# Patient Record
Sex: Female | Born: 1935 | Race: White | Hispanic: No | Marital: Married | Smoking: Never smoker
Health system: Southern US, Community
[De-identification: ages and names within clinical notes are randomized; demographics above are authoritative.]

## PROBLEM LIST (undated history)

## (undated) DIAGNOSIS — C8510 Unspecified B-cell lymphoma, unspecified site: Secondary | ICD-10-CM

## (undated) DIAGNOSIS — I509 Heart failure, unspecified: Secondary | ICD-10-CM

## (undated) DIAGNOSIS — I495 Sick sinus syndrome: Secondary | ICD-10-CM

## (undated) DIAGNOSIS — E039 Hypothyroidism, unspecified: Secondary | ICD-10-CM

## (undated) DIAGNOSIS — M329 Systemic lupus erythematosus, unspecified: Secondary | ICD-10-CM

## (undated) DIAGNOSIS — K219 Gastro-esophageal reflux disease without esophagitis: Secondary | ICD-10-CM

## (undated) DIAGNOSIS — I6529 Occlusion and stenosis of unspecified carotid artery: Secondary | ICD-10-CM

## (undated) DIAGNOSIS — I10 Essential (primary) hypertension: Secondary | ICD-10-CM

## (undated) DIAGNOSIS — I4891 Unspecified atrial fibrillation: Secondary | ICD-10-CM

## (undated) DIAGNOSIS — IMO0002 Reserved for concepts with insufficient information to code with codable children: Secondary | ICD-10-CM

## (undated) HISTORY — DX: Occlusion and stenosis of unspecified carotid artery: I65.29

## (undated) HISTORY — PX: TOTAL KNEE ARTHROPLASTY: SHX125

## (undated) HISTORY — PX: LAPAROSCOPIC CHOLECYSTECTOMY: SUR755

## (undated) HISTORY — DX: Systemic lupus erythematosus, unspecified: M32.9

## (undated) HISTORY — DX: Gastro-esophageal reflux disease without esophagitis: K21.9

## (undated) HISTORY — DX: Essential (primary) hypertension: I10

## (undated) HISTORY — DX: Sick sinus syndrome: I49.5

## (undated) HISTORY — DX: Unspecified atrial fibrillation: I48.91

## (undated) HISTORY — DX: Reserved for concepts with insufficient information to code with codable children: IMO0002

---

## 2004-05-28 ENCOUNTER — Inpatient Hospital Stay (HOSPITAL_COMMUNITY): Admission: AD | Admit: 2004-05-28 | Discharge: 2004-05-29 | Payer: Self-pay | Admitting: *Deleted

## 2004-05-29 ENCOUNTER — Encounter: Payer: Self-pay | Admitting: Cardiology

## 2004-05-31 ENCOUNTER — Inpatient Hospital Stay (HOSPITAL_COMMUNITY): Admission: RE | Admit: 2004-05-31 | Discharge: 2004-06-04 | Payer: Self-pay | Admitting: Orthopaedic Surgery

## 2004-10-11 ENCOUNTER — Ambulatory Visit: Payer: Self-pay | Admitting: Cardiology

## 2005-09-26 ENCOUNTER — Ambulatory Visit: Payer: Self-pay | Admitting: Cardiology

## 2005-10-01 ENCOUNTER — Ambulatory Visit: Payer: Self-pay | Admitting: Cardiology

## 2005-10-23 ENCOUNTER — Ambulatory Visit: Payer: Self-pay | Admitting: Cardiology

## 2005-10-24 ENCOUNTER — Ambulatory Visit: Payer: Self-pay | Admitting: Cardiology

## 2006-05-26 ENCOUNTER — Ambulatory Visit: Payer: Self-pay | Admitting: Cardiology

## 2007-01-05 ENCOUNTER — Ambulatory Visit: Payer: Self-pay | Admitting: Cardiology

## 2008-01-11 ENCOUNTER — Ambulatory Visit: Payer: Self-pay | Admitting: Cardiology

## 2008-01-12 ENCOUNTER — Encounter: Payer: Self-pay | Admitting: Cardiology

## 2008-07-30 ENCOUNTER — Encounter: Payer: Self-pay | Admitting: Cardiology

## 2009-09-05 ENCOUNTER — Encounter: Payer: Self-pay | Admitting: Cardiology

## 2010-06-25 ENCOUNTER — Encounter: Payer: Self-pay | Admitting: Cardiology

## 2010-06-25 ENCOUNTER — Encounter: Payer: Self-pay | Admitting: Physician Assistant

## 2010-06-26 ENCOUNTER — Ambulatory Visit: Payer: Self-pay | Admitting: Cardiology

## 2010-06-26 ENCOUNTER — Encounter: Payer: Self-pay | Admitting: Physician Assistant

## 2010-06-27 ENCOUNTER — Encounter: Payer: Self-pay | Admitting: Physician Assistant

## 2010-06-27 ENCOUNTER — Ambulatory Visit: Payer: Self-pay | Admitting: Internal Medicine

## 2010-06-27 ENCOUNTER — Inpatient Hospital Stay (HOSPITAL_COMMUNITY): Admission: AD | Admit: 2010-06-27 | Discharge: 2010-06-30 | Payer: Self-pay | Admitting: Internal Medicine

## 2010-06-28 ENCOUNTER — Encounter: Payer: Self-pay | Admitting: Cardiology

## 2010-06-29 ENCOUNTER — Encounter: Payer: Self-pay | Admitting: Cardiology

## 2010-06-29 HISTORY — PX: PACEMAKER INSERTION: SHX728

## 2010-06-30 ENCOUNTER — Encounter: Payer: Self-pay | Admitting: Cardiology

## 2010-07-03 ENCOUNTER — Encounter: Payer: Self-pay | Admitting: Internal Medicine

## 2010-07-12 ENCOUNTER — Ambulatory Visit: Payer: Self-pay

## 2010-07-12 ENCOUNTER — Encounter: Payer: Self-pay | Admitting: Internal Medicine

## 2010-07-30 ENCOUNTER — Ambulatory Visit: Payer: Self-pay | Admitting: Physician Assistant

## 2010-07-30 DIAGNOSIS — D649 Anemia, unspecified: Secondary | ICD-10-CM

## 2010-07-30 DIAGNOSIS — I4891 Unspecified atrial fibrillation: Secondary | ICD-10-CM

## 2010-07-30 DIAGNOSIS — I6529 Occlusion and stenosis of unspecified carotid artery: Secondary | ICD-10-CM

## 2010-07-30 DIAGNOSIS — Z950 Presence of cardiac pacemaker: Secondary | ICD-10-CM | POA: Insufficient documentation

## 2010-08-03 ENCOUNTER — Ambulatory Visit: Payer: Self-pay | Admitting: Cardiology

## 2010-08-03 ENCOUNTER — Encounter: Payer: Self-pay | Admitting: Cardiology

## 2010-08-07 ENCOUNTER — Ambulatory Visit: Payer: Self-pay | Admitting: Cardiology

## 2010-08-07 LAB — CONVERTED CEMR LAB: POC INR: 2.3

## 2010-08-14 ENCOUNTER — Ambulatory Visit: Payer: Self-pay | Admitting: Cardiology

## 2010-08-14 LAB — CONVERTED CEMR LAB: POC INR: 2.1

## 2010-08-24 ENCOUNTER — Ambulatory Visit: Payer: Self-pay | Admitting: Cardiology

## 2010-08-24 LAB — CONVERTED CEMR LAB: POC INR: 2.4

## 2010-08-27 ENCOUNTER — Ambulatory Visit: Payer: Self-pay | Admitting: Cardiology

## 2010-08-27 DIAGNOSIS — I495 Sick sinus syndrome: Secondary | ICD-10-CM | POA: Insufficient documentation

## 2010-09-11 ENCOUNTER — Ambulatory Visit: Payer: Self-pay | Admitting: Cardiology

## 2010-09-25 ENCOUNTER — Ambulatory Visit: Payer: Self-pay | Admitting: Internal Medicine

## 2010-09-25 DIAGNOSIS — I10 Essential (primary) hypertension: Secondary | ICD-10-CM

## 2010-10-02 ENCOUNTER — Ambulatory Visit: Payer: Self-pay | Admitting: Cardiology

## 2010-10-17 ENCOUNTER — Encounter: Payer: Self-pay | Admitting: Cardiology

## 2010-11-02 ENCOUNTER — Ambulatory Visit: Admission: RE | Admit: 2010-11-02 | Discharge: 2010-11-02 | Payer: Self-pay | Source: Home / Self Care

## 2010-11-14 NOTE — Cardiovascular Report (Signed)
Summary: Office Visit   Office Visit   Imported By: Roderic Ovens 07/23/2010 11:15:00  _____________________________________________________________________  External Attachment:    Type:   Image     Comment:   External Document

## 2010-11-14 NOTE — Medication Information (Signed)
Summary: ccr-lr  Anticoagulant Therapy  Managed by: Vashti Hey, RN PCP: Dr. Kirstie Peri Supervising MD: Andee Lineman MD, Michelle Piper Indication 1: Atrial Fibrillation Lab Used: [facility] [place] Site: [place] INR POC 2.0  Dietary changes: no    Health status changes: no    Bleeding/hemorrhagic complications: no    Recent/future hospitalizations: no    Any changes in medication regimen? no    Recent/future dental: no  Any missed doses?: no       Is patient compliant with meds? yes       Allergies: 1)  ! Penicillin  Anticoagulation Management History:      The patient is taking warfarin and comes in today for a routine follow up visit.  Positive risk factors for bleeding include an age of 13 years or older.  The bleeding index is 'intermediate risk'.  Positive CHADS2 values include Age > 76 years old.  Anticoagulation responsible provider: Andee Lineman MD, Michelle Piper.  INR POC: 2.0.  Cuvette Lot#: [id number].    Anticoagulation Management Assessment/Plan:      The patient's current anticoagulation dose is Warfarin sodium 2.5 mg tabs: Use as directed by Anticoagualtion Clinic.  The target INR is 2.0-3.0.  The next INR is due 10/02/2010.  Anticoagulation instructions were given to patient.  Results were reviewed/authorized by Vashti Hey, RN.  She was notified by Vashti Hey RN.         Prior Anticoagulation Instructions: INR 2.4 Continue coumadin 2.5mg  once daily except 5mg  on T,Th,Sat  Current Anticoagulation Instructions: INR 2.0 Continue coumadin 2.5mg  once daily except 5mg  on T,Th,Sat

## 2010-11-14 NOTE — Medication Information (Signed)
Summary: ZOX:WRUEA 2.5MG  ON 10/17-[facility] PT-JM  Anticoagulant Therapy  Managed by: Vashti Hey, RN PCP: Seabron Spates Supervising MD: Andee Lineman MD, Marzetta Merino Used: [facility] [place] Site: [place] INR POC 1.1           Allergies: 1)  ! Penicillin  Anticoagulation Management History:      The patient comes in today for her initial visit for anticoagulation therapy.  Positive risk factors for bleeding include an age of 75 years or older.  The bleeding index is 'intermediate risk'.  Positive CHADS2 values include Age > 54 years old.  Anticoagulation responsible Viana Sleep: Andee Lineman MD, Michelle Piper.  INR POC: 1.1.    Anticoagulation Management Assessment/Plan:      The patient's current anticoagulation dose is Warfarin sodium 2.5 mg tabs: Use as directed by Anticoagualtion Clinic.  The target INR is 2.0-3.0.  The next INR is due 08/07/2010.  Anticoagulation instructions were given to patient.  Results were reviewed/authorized by Vashti Hey, RN.  She was notified by Vashti Hey RN.        Coagulation management information includes: not pending DCCV.  Current Anticoagulation Instructions: INR 1.1 Increase coumadin to 2 tablets once daily

## 2010-11-14 NOTE — Consult Note (Signed)
Summary: CARDIOLOGY CONSULT/ [facility]  CARDIOLOGY CONSULT/ [facility]   Imported By: Zachary George 07/30/2010 10:01:59  _____________________________________________________________________  External Attachment:    Type:   Image     Comment:   External Document

## 2010-11-14 NOTE — Assessment & Plan Note (Signed)
Summary: 1 MO F/U PER 10/17 OV W/DR. HOCHREIN-JM   Visit Type:  Follow-up Primary Provider:  Dr. Kirstie Peri   History of Present Illness: 77 year old woman presents for followup. She is a patient of Dr. Andee Lineman, although recently saw Dr. Antoine Poche in mid-October for post hospital followup. Coumadin was initiated at that time.  I reviewed her recent history following inpatient consultation at [facility]. Symptomatically she is doing better now status post pacemaker placement, although does tend to have some sense of elevated heart rate with activity. It is somewhat better on Corag.  She reports having trouble with alopecia, possibly related to a previous blood pressure medication, details not clear. She seems to be tolerating her present medicines well.  No reported bleeding problems on Coumadin, follow through our Coumadin clinic.   Preventive Screening-Counseling & Management  Alcohol-Tobacco     Smoking Status: never  Current Medications (verified): 1)  Carvedilol 6.25 Mg Tabs (Carvedilol) .... Take 1 1/2 Tablets By Mouth Two Times A Day. 2)  Vitamin D3 1000 Unit Tabs (Cholecalciferol) .... One By Mouth Daily 3)  Benazepril Hcl 20 Mg Tabs (Benazepril Hcl) .... Take 1 Tablet By Mouth Once A Day 4)  Warfarin Sodium 2.5 Mg Tabs (Warfarin Sodium) .... Use As Directed By Anticoagualtion Clinic  Allergies (verified): 1)  ! Penicillin  Comments:  Nurse/Medical Assistant: The patient's medication bottles and allergies were reviewed with the patient and were updated in the Medication and Allergy Lists.  Past History:  Social History: Last updated: 08/27/2010 Married  Tobacco Use - No.  Alcohol Use - no  Past Medical History: [employer]. Jude Medical Accent RF DR model [id number] (serial number [id number]) pacemaker GERD Carotid stenosis (06/2010, right 50-69%, left <50%) Atrial Fibrillation, tachy-brady syndrome, CHADS2 score 2 Hypertension  Family History: Noncontributory  Social  History: Married  Tobacco Use - No.  Alcohol Use - no  Review of Systems       The patient complains of dyspnea on exertion.  The patient denies anorexia, fever, chest pain, syncope, peripheral edema, prolonged cough, headaches, melena, hematochezia, and severe indigestion/heartburn.         Otherwise reviewed and negative except as already outlined.  Vital Signs:  Patient profile:   76 year old female Height:      58 inches Weight:      150 pounds O2 Sat:      98 % on Room air Pulse rate:   61 / minute BP sitting:   162 / 93  (left arm) Cuff size:   regular  Vitals Entered By: Carlye Grippe (August 27, 2010 11:19 AM)  O2 Flow:  Room air  Physical Exam  Additional Exam:  Overweight elderly woman in no acute distress. HEENT: Conjunctiva and lids normal, oropharynx with moist mucosa. Neck: Supple, no elevated JVP or bruits, or thyromegaly. Thorax: Well-healed pacemaker pocket. Cardiac: Regular rate and rhythm, no S3. Abdomen: Soft, nontender, bowel sounds present. Family: No pitting edema.   Echocardiogram  Procedure date:  06/26/2010  Findings:      LVEF 60-65% without regional wall motion abnormalities, moderate to severe left atrial enlargement, mild mitral regurgitation, no significant tricuspid regurgitation.  EKG  Procedure date:  08/27/2010  Findings:      Atrial paced rhythm at 64 beats per minute, R prime in lead V1, decreased R wave progression anteriorly with decreased voltage.  PPM Specifications Following MD:  Hillis Range, MD     PPM Vendor:  [employer] Jude     PPM Model  Number:  [id number]     PPM Serial Number:  [id number] PPM DOI:  06/29/2010     PPM Implanting MD:  Hillis Range, MD  Lead 1    Location: [facility]     DOI: 06/29/2010     Model #: [id number]     Serial #: [id number]     Status: active Lead 2    Location: RV     DOI: 06/29/2010     Model #: 1948     Serial #: [id number]     Status: active  Magnet Response Rate:  BOL 100 ERI 85  Indications:  Sick sinus  syndrome   PPM Follow Up Pacer Dependent:  No      Episodes Coumadin:  No  Parameters Mode:  DDDR     Lower Rate Limit:  60     Upper Rate Limit:  120 Paced AV Delay:  200     Sensed AV Delay:  150  Impression & Recommendations:  Problem # 1:  BRADYCARDIA-TACHYCARDIA SYNDROME (ICD-427.81)  Symptomatically stable at this point status post pacemaker placement as outlined above. Patient due to have followup with Dr. Johney Frame in December.  Her updated medication list for this problem includes:    Carvedilol 12.5 Mg Tabs (Carvedilol) .Marland Kitchen... Take one tablet by mouth twice a day    Benazepril Hcl 20 Mg Tabs (Benazepril hcl) .Marland Kitchen... Take 1 tablet by mouth once a day    Warfarin Sodium 2.5 Mg Tabs (Warfarin sodium) ..... Use as directed by anticoagualtion clinic  Problem # 2:  FIBRILLATION, ATRIAL (ICD-427.31)  Patient tolerating Coumadin. This was initiated and Dr. Antoine Poche at the last visit. She has a CHADS2 score of 2. No active bleeding problems with followup through the Coumadin clinic. She does sense some rapid heart rate with activity, and I plan to increase her Coreg to 12.5 g p.o. b.i.d. to see if this provides better heart rate control when she is in atrial fibrillation. Otherwise followup will be scheduled with her primary cardiologist, Dr. Andee Lineman, over the next 3 months.  Her updated medication list for this problem includes:    Carvedilol 12.5 Mg Tabs (Carvedilol) .Marland Kitchen... Take one tablet by mouth twice a day    Warfarin Sodium 2.5 Mg Tabs (Warfarin sodium) ..... Use as directed by anticoagualtion clinic  Orders: EKG w/ Interpretation (93000)  Patient Instructions: 1)  Keep appointment with Misty Stanley in the Coumadin Clinic on Tues, September 11, 2010 at 2:45pm. 2)  Keep appointment with Dr. Deno Lunger for a device check on Tuesday, September 25, 2010 at 1:45pm. 3)  Return for follow up appt with Dr. Earnestine Leys on Thursday, November 15, 2010 at 10:30am. 4)  Increase Coreg (carvedilol) tol 12.5mg   by mouth two times a day. This will be 2 of your 6.25mg  tablets two times a day until gone and then get new prescription filled for 12.5mg  tablets two times a day. Prescriptions: CARVEDILOL 12.5 MG TABS (CARVEDILOL) Take one tablet by mouth twice a day  #60 x 6   Entered by:   Cyril Loosen, RN, BSN   Authorized by:   Loreli Slot, MD, [facility]   Signed by:   Cyril Loosen, RN, BSN on 08/27/2010   Method used:   Electronically to        [facility]* (retail)       [address]       [place], [place]  [address]  Ph: [phone]       Fax: [phone]   RxID:   [id number]

## 2010-11-14 NOTE — Assessment & Plan Note (Signed)
Summary: [facility]-POST [facility] 1 MO FU PER FAX   Visit Type:  Follow-up Primary Provider:  Beatrix Fetters Shah,MD  CC:  Atrial Fibrillation.  History of Present Illness: The patient was recently hospitalized with tachybradycardia syndrome.  She had atrial fibrillation.  She underwent pacemaker placement. Anticoagulation was not yet started though she was evaluated during her cardiac consultation found to be at risk with significant contraindications. Since going home she has had a wound check and pacemaker check in the office in [place]. She reports that she has had a couple episodes of feeling like her heart might race.  She doesn't actually describe palpitations but has discomfort in her chest. She has had one episode of chest discomfort with significant ambulation into our office. She seems to report that this has been going on for a while. She had a negative stress test in 2009. She doesn't describe any resting chest pressure. She has no presyncope or syncope. She has no PND or orthopnea. She has had no GI bleeding or falls.  I do note that she had normal renal function, normal PT but was slightly anemic.  Preventive Screening-Counseling & Management  Alcohol-Tobacco     Smoking Status: never  Current Medications (verified): 1)  Aspir-Low 81 Mg Tbec (Aspirin) .... One By Mouth Daily 2)  Carvedilol 6.25 Mg Tabs (Carvedilol) .... One By Mouth Two Times A Day 3)  Vitamin D3 1000 Unit Tabs (Cholecalciferol) .... One By Mouth Daily 4)  Benazepril Hcl 40 Mg Tabs (Benazepril Hcl) .... One-Half By Mouth Daily  Allergies (verified): 1)  ! Penicillin  Comments:  Nurse/Medical Assistant: The patient's medication bottles and allergies were reviewed with the patient and were updated in the Medication and Allergy Lists.  Past History:  Past Medical History: St. Jude Medical Accent RF DR model [id number] (serial number [id number]) pacemaker. Tachybardy syndrome PAF GERD Carotid stenosis (06/2010 Right 50 - 69%,  left <50%)  Past Surgical History: Total right knee replacement Laparoscopic cholecystectomy Pacemaker placement  Social History: Smoking Status:  never  Review of Systems       As stated in the HPI and negative for all other systems.   Vital Signs:  Patient profile:   77 year old female Height:      58 inches Weight:      148 pounds BMI:     31.04 Pulse rate:   65 / minute BP sitting:   131 / 75  (left arm) Cuff size:   regular  Vitals Entered By: Carlye Grippe (July 30, 2010 10:08 AM)  Physical Exam  General:  Elderly appearing but in no distress Head:  normocephalic and atraumatic Eyes:  PERRLA/EOM intact; conjunctiva and lids normal. Mouth:  Teeth, gums and palate normal. Oral mucosa normal. Neck:  Neck supple, no JVD. No masses, thyromegaly or abnormal cervical nodes. Chest Wall:  Well-healed pacemaker pocket Lungs:  Clear bilaterally to auscultation and percussion. Abdomen:  Bowel sounds positive; abdomen soft and non-tender without masses, organomegaly, or hernias noted. No hepatosplenomegaly. Msk:  lordosis.   Extremities:  No clubbing or cyanosis. Neurologic:  Alert and oriented x 3. Skin:  Intact without lesions or rashes. Cervical Nodes:  no significant adenopathy Inguinal Nodes:  no significant adenopathy Psych:  Normal affect.   Detailed Cardiovascular Exam  Neck    Neck Veins: Normal, no JVD.    Heart    Inspection: no deformities or lifts noted.      Palpation: normal PMI with no thrills palpable.  Auscultation: regular rate and rhythm, S1, S2 without murmurs, rubs, gallops, or clicks.    Vascular    Abdominal Aorta: no palpable masses, pulsations, or audible bruits.      Femoral Pulses: normal femoral pulses bilaterally.      Pedal Pulses: normal pedal pulses bilaterally.      Radial Pulses: normal radial pulses bilaterally.      Peripheral Circulation: no clubbing, cyanosis, or edema noted with normal capillary refill.     PPM  Specifications Following MD:  Hillis Range, MD     PPM Vendor:  St Jude     PPM Model Number:  [id number]     PPM Serial Number:  [id number] PPM DOI:  06/29/2010     PPM Implanting MD:  Hillis Range, MD  Lead 1    Location: [facility]     DOI: 06/29/2010     Model #: [id number]     Serial #: [id number]     Status: active Lead 2    Location: [facility]     DOI: 06/29/2010     Model #: 1948     Serial #: [id number]     Status: active  Magnet Response Rate:  BOL 100 ERI 85  Indications:  Sick sinus syndrome   PPM Follow Up Pacer Dependent:  No      Episodes Coumadin:  No  Parameters Mode:  DDDR     Lower Rate Limit:  60     Upper Rate Limit:  120 Paced AV Delay:  200     Sensed AV Delay:  150  Impression & Recommendations:  Problem # 1:  FIBRILLATION, ATRIAL (ICD-427.31) She is at risk for thromboembolic events. She understands this and we discussed this again. There is no obvious contraindication to blood thinners. She will not be able to afford Pradaxa and so we will start Coumadin with appropriate education. We offered to call her daughter to discuss this but she will do this. She is not a fall risk per her report. I do note that she is anemic and will send her with guaiac cards. She's had no obvious GI bleeding. She will followup in our clinic here. Again the risks and benefits have been clearly defined. She agrees to take the Coumadin.  Problem # 2:  CAROTID ARTERY STENOSIS (ICD-433.10)  She will followup Doppler in one yearshe.  The following medications were removed from the medication list:    Aspir-low 81 Mg Tbec (Aspirin) ..... One by mouth daily Her updated medication list for this problem includes:    Warfarin Sodium 2.5 Mg Tabs (Warfarin sodium) ..... Use as directed by anticoagualtion clinic  Problem # 3:  PACEMAKER, PERMANENT (ICD-V45.01) She will have follow up in our Coumadin clinic.  Other Orders: T-Hemoccult Cards-Multiple ([address])  Patient Instructions: 1)  Stop Aspirin. 2)  Start  Coumadin (warfarin) 2.5mg  by mouth as directed. You will take one tablet every afternoon (around 4-5pm) and return to our Coumadin Clinic on Friday, August 03, 2010 at 1:30pm. 3)  Increase Coreg (carvedilol) to 9.375mg  by mouth two times a day. This will be 1 1/2 of your 6.25mg  tablets. A new prescription was sent to your pharmacy to notify them of this change.  4)  Your physician recommends that you go to the [facility] for lab work. You will pick up stool cards and return to them when complete.  5)  Follow up with Dr. Diona Browner on Monday, August 27, 2010 at 11am.  Prescriptions: WARFARIN SODIUM 2.5  MG TABS (WARFARIN SODIUM) Use as directed by Anticoagualtion Clinic  #45 x 1   Entered by:   Cyril Loosen, RN, BSN   Authorized by:   Rollene Rotunda, MD, [facility]   Signed by:   Cyril Loosen, RN, BSN on 07/30/2010   Method used:   Electronically to        [facility]* (retail)       [address]       [place], [place]  [address]       Ph: [phone]       Fax: [phone]   RxID:   [id number]   Handout requested. CARVEDILOL 6.25 MG TABS (CARVEDILOL) Take 1 1/2 tablets by mouth two times a day.  #90 x 6   Entered by:   Cyril Loosen, RN, BSN   Authorized by:   Rollene Rotunda, MD, [facility]   Signed by:   Cyril Loosen, RN, BSN on 07/30/2010   Method used:   Electronically to        [facility]* (retail)       [address]       [place], [place]  [address]       Ph: [phone]       Fax: [phone]   RxID:   [id number]

## 2010-11-14 NOTE — Miscellaneous (Signed)
Summary: Device preload  Clinical Lists Changes  Observations: Added new observation of PPM INDICATN: Sick sinus syndrome (07/03/2010 18:11) Added new observation of MAGNET RTE: BOL 100 ERI 85 (07/03/2010 18:11) Added new observation of PPMLEADSTAT2: active (07/03/2010 18:11) Added new observation of PPMLEADSER2: [id number] (07/03/2010 18:11) Added new observation of PPMLEADMOD2: 1946  (07/03/2010 18:11) Added new observation of PPMLEADLOC2: RV  (07/03/2010 18:11) Added new observation of PPMLEADSTAT1: active  (07/03/2010 18:11) Added new observation of PPMLEADSER1: [id number]  (07/03/2010 18:11) Added new observation of PPMLEADMOD1: 2088TC  (07/03/2010 18:11) Added new observation of PPMLEADLOC1: RA  (07/03/2010 18:11) Added new observation of PPMLEADDOI2: 06/29/2010  (07/03/2010 18:11) Added new observation of PPMLEADDOI1: 06/29/2010  (07/03/2010 18:11) Added new observation of PPM IMP MD: Hillis Range, MD  (07/03/2010 18:11) Added new observation of PPM DOI: 06/29/2010  (07/03/2010 18:11) Added new observation of PPM SERL#: [id number]  (07/03/2010 18:11) Added new observation of PPM MODL#: [id number]  (07/03/2010 53:66) Added new observation of PACEMAKERMFG: St Jude  (07/03/2010 18:11) Added new observation of PACEMAKER MD: Hillis Range, MD  (07/03/2010 18:11)      PPM Specifications Following MD:  Hillis Range, MD     PPM Vendor:  St Jude     PPM Model Number:  [id number]     PPM Serial Number:  [id number] PPM DOI:  06/29/2010     PPM Implanting MD:  Hillis Range, MD  Lead 1    Location: RA     DOI: 06/29/2010     Model #: [id number]     Serial #: [id number]     Status: active Lead 2    Location: RV     DOI: 06/29/2010     Model #: 1948     Serial #: [id number]     Status: active  Magnet Response Rate:  BOL 100 ERI 85  Indications:  Sick sinus syndrome

## 2010-11-14 NOTE — Medication Information (Signed)
Summary: ccr-lr  Anticoagulant Therapy  Managed by: Vashti Hey, RN PCP: Seabron Spates Supervising MD: Andee Lineman MD, Michelle Piper Indication 1: Atrial Fibrillation Lab Used: [facility] [place] Site: [place] INR POC 2.4  Dietary changes: no    Health status changes: no    Bleeding/hemorrhagic complications: no    Recent/future hospitalizations: no    Any changes in medication regimen? no    Recent/future dental: no  Any missed doses?: no       Is patient compliant with meds? yes       Allergies: 1)  ! Penicillin  Anticoagulation Management History:      The patient is taking warfarin and comes in today for a routine follow up visit.  Positive risk factors for bleeding include an age of 75 years or older.  The bleeding index is 'intermediate risk'.  Positive CHADS2 values include Age > 32 years old.  Anticoagulation responsible provider: Andee Lineman MD, Michelle Piper.  INR POC: 2.4.  Cuvette Lot#: [id number].    Anticoagulation Management Assessment/Plan:      The patient's current anticoagulation dose is Warfarin sodium 2.5 mg tabs: Use as directed by Anticoagualtion Clinic.  The target INR is 2.0-3.0.  The next INR is due 09/11/2010.  Anticoagulation instructions were given to patient.  Results were reviewed/authorized by Vashti Hey, RN.  She was notified by Vashti Hey RN.         Prior Anticoagulation Instructions: INR 2.1 Continue coumadin 2.5mg  once daily except 5mg  on T,Th,Sat  Current Anticoagulation Instructions: INR 2.4 Continue coumadin 2.5mg  once daily except 5mg  on T,Th,Sat

## 2010-11-14 NOTE — Medication Information (Signed)
Summary: ccr-lr  Anticoagulant Therapy  Managed by: Vashti Hey, RN PCP: Seabron Spates Supervising MD: Diona Browner MD, Remi Deter Indication 1: Atrial Fibrillation Lab Used: [facility] [place] Site: [place] INR POC 2.1  Dietary changes: no    Health status changes: no    Bleeding/hemorrhagic complications: no    Recent/future hospitalizations: no    Any changes in medication regimen? no    Recent/future dental: no  Any missed doses?: no       Is patient compliant with meds? yes       Allergies: 1)  ! Penicillin  Anticoagulation Management History:      The patient is taking warfarin and comes in today for a routine follow up visit.  Positive risk factors for bleeding include an age of 75 years or older.  The bleeding index is 'intermediate risk'.  Positive CHADS2 values include Age > 53 years old.  Anticoagulation responsible provider: Diona Browner MD, Remi Deter.  INR POC: 2.1.  Cuvette Lot#: [id number].    Anticoagulation Management Assessment/Plan:      The patient's current anticoagulation dose is Warfarin sodium 2.5 mg tabs: Use as directed by Anticoagualtion Clinic.  The target INR is 2.0-3.0.  The next INR is due 08/24/2010.  Anticoagulation instructions were given to patient.  Results were reviewed/authorized by Vashti Hey, RN.  She was notified by Vashti Hey RN.         Prior Anticoagulation Instructions: INR 2.3 Decrease coumadin to 2.5mg  once daily except 5mg  on T,TH,Sat  Current Anticoagulation Instructions: INR 2.1 Continue coumadin 2.5mg  once daily except 5mg  on T,Th,Sat

## 2010-11-14 NOTE — Procedures (Signed)
Summary: wound check   Current Medications (verified): 1)  Aspir-Low 81 Mg Tbec (Aspirin) .... One By Mouth Daily 2)  Carvedilol 6.25 Mg Tabs (Carvedilol) .... One By Mouth Two Times A Day 3)  Vitamin D3 1000 Unit Tabs (Cholecalciferol) .... One By Mouth Daily 4)  Benazepril Hcl 40 Mg Tabs (Benazepril Hcl) .... One By Mouth Daily  Allergies (verified): 1)  ! Penicillin  PPM Specifications Following MD:  Hillis Range, MD     PPM Vendor:  St Jude     PPM Model Number:  [id number]     PPM Serial Number:  [id number] PPM DOI:  06/29/2010     PPM Implanting MD:  Hillis Range, MD  Lead 1    Location: RA     DOI: 06/29/2010     Model #: [id number]     Serial #: [id number]     Status: active Lead 2    Location: RV     DOI: 06/29/2010     Model #: 1948     Serial #: [id number]     Status: active  Magnet Response Rate:  BOL 100 ERI 85  Indications:  Sick sinus syndrome   PPM Follow Up Remote Check?  No Battery Voltage:  3.13 V     Battery Est. Longevity:  6.3     Pacer Dependent:  No       PPM Device Measurements Atrium  Amplitude: 2.4 mV, Impedance: 430 ohms, Threshold: 0.75 V at 0.4 msec Right Ventricle  Amplitude: 6.3 mV, Impedance: 750 ohms, Threshold: 0.375 V at 0.4 msec  Episodes MS Episodes:  0     Percent Mode Switch:  0     Coumadin:  No Atrial Pacing:  95%     Ventricular Pacing:  <1%  Parameters Mode:  DDDR     Lower Rate Limit:  60     Upper Rate Limit:  120 Paced AV Delay:  200     Sensed AV Delay:  150 Tech Comments:  Steri strips removed, no redness or edema.  Rate response and ventricular autocapture programmed on today.  ROV 3 months with Dr. Johney Frame in [place]. Altha Harm, LPN  July 12, 2010 10:05 AM  4

## 2010-11-14 NOTE — Consult Note (Signed)
Summary: [facility]   [facility]   Imported By: Roderic Ovens 07/31/2010 15:02:07  _____________________________________________________________________  External Attachment:    Type:   Image     Comment:   External Document

## 2010-11-14 NOTE — Medication Information (Signed)
Summary: ccr-lr  Anticoagulant Therapy  Managed by: Vashti Hey, RN PCP: Seabron Spates Supervising MD: Diona Browner MD, Remi Deter Indication 1: Atrial Fibrillation Lab Used: [facility] [place] Site: [place] INR POC 2.3           Allergies: 1)  ! Penicillin  Anticoagulation Management History:      The patient is taking warfarin and comes in today for a routine follow up visit.  Positive risk factors for bleeding include an age of 75 years or older.  The bleeding index is 'intermediate risk'.  Positive CHADS2 values include Age > 75 years old.  Anticoagulation responsible provider: Diona Browner MD, Remi Deter.  INR POC: 2.3.  Cuvette Lot#: [id number].    Anticoagulation Management Assessment/Plan:      The patient's current anticoagulation dose is Warfarin sodium 2.5 mg tabs: Use as directed by Anticoagualtion Clinic.  The target INR is 2.0-3.0.  The next INR is due 08/14/2010.  Anticoagulation instructions were given to patient.  Results were reviewed/authorized by Vashti Hey, RN.  She was notified by Vashti Hey RN.         Prior Anticoagulation Instructions: INR 1.1 Increase coumadin to 2 tablets once daily   Current Anticoagulation Instructions: INR 2.3 Decrease coumadin to 2.5mg  once daily except 5mg  on T,TH,Sat

## 2010-11-15 NOTE — Medication Information (Signed)
Summary: ccr-lr  Anticoagulant Therapy  Managed by: Vashti Hey, RN PCP: Dr. Kirstie Peri Supervising MD: Andee Lineman MD, Michelle Piper Indication 1: Atrial Fibrillation Lab Used: [facility] [place] Site: [place] INR POC 2.0  Dietary changes: no    Health status changes: no    Bleeding/hemorrhagic complications: no    Recent/future hospitalizations: no    Any changes in medication regimen? no    Recent/future dental: no  Any missed doses?: no       Is patient compliant with meds? yes       Allergies: 1)  ! Penicillin  Anticoagulation Management History:      The patient is taking warfarin and comes in today for a routine follow up visit.  Positive risk factors for bleeding include an age of 4 years or older.  The bleeding index is 'intermediate risk'.  Positive CHADS2 values include History of HTN and Age > 34 years old.  Anticoagulation responsible provider: Andee Lineman MD, Michelle Piper.  INR POC: 2.0.  Cuvette Lot#: [id number].    Anticoagulation Management Assessment/Plan:      The patient's current anticoagulation dose is Warfarin sodium 2.5 mg tabs: Use as directed by Anticoagualtion Clinic.  The target INR is 2.0-3.0.  The next INR is due 11/30/2010.  Anticoagulation instructions were given to patient.  Results were reviewed/authorized by Vashti Hey, RN.  She was notified by Vashti Hey RN.         Prior Anticoagulation Instructions: INR 2.0 Take coumadin 2 1/2 tablets tonight then resume 1 tablet once daily except 2 tablets on T,Th,Sat  Current Anticoagulation Instructions: INR 2.0 Take coumadin 1 1/2 tablets tonight then resume 1 tablet once daily except 2 tablets on T,Th,Sat

## 2010-11-15 NOTE — Medication Information (Signed)
Summary: ccr-lr  Anticoagulant Therapy  Managed by: Vashti Hey, RN PCP: Dr. Kirstie Peri Supervising MD: Diona Browner MD, Remi Deter Indication 1: Atrial Fibrillation Lab Used: [facility] [place] Site: [place] INR POC 2.0  Dietary changes: no    Health status changes: no    Bleeding/hemorrhagic complications: no    Recent/future hospitalizations: no    Any changes in medication regimen? no    Recent/future dental: no  Any missed doses?: no       Is patient compliant with meds? yes       Allergies: 1)  ! Penicillin  Anticoagulation Management History:      The patient is taking warfarin and comes in today for a routine follow up visit.  Positive risk factors for bleeding include an age of 75 years or older.  The bleeding index is 'intermediate risk'.  Positive CHADS2 values include History of HTN and Age > 75 years old.  Anticoagulation responsible provider: Diona Browner MD, Remi Deter.  INR POC: 2.0.  Cuvette Lot#: [id number].    Anticoagulation Management Assessment/Plan:      The patient's current anticoagulation dose is Warfarin sodium 2.5 mg tabs: Use as directed by Anticoagualtion Clinic.  The target INR is 2.0-3.0.  The next INR is due 10/30/2010.  Anticoagulation instructions were given to patient.  Results were reviewed/authorized by Vashti Hey, RN.  She was notified by Vashti Hey RN.         Prior Anticoagulation Instructions: INR 2.0 Continue coumadin 2.5mg  once daily except 5mg  on T,Th,Sat  Current Anticoagulation Instructions: INR 2.0 Take coumadin 2 1/2 tablets tonight then resume 1 tablet once daily except 2 tablets on T,Th,Sat

## 2010-11-15 NOTE — Cardiovascular Report (Signed)
Summary[facility]  [facility]   Imported By: Dorise Hiss 09/26/2010 10:24:56  _____________________________________________________________________  External Attachment:    Type:   Image     Comment:   External Document

## 2010-11-15 NOTE — Assessment & Plan Note (Signed)
Summary: 3 MO FU PER REMINDER   Visit Type:  Pacemaker check Primary Provider:  Dr. Kirstie Peri   History of Present Illness: The patient presents today for routine electrophysiology followup. She reports doing very well since having her pacemaker implanted.  Her energy has improved.  She continues to have stable exertional shortness of breath and chronic BLE edema.  The patient denies symptoms of palpitations, chest pain, orthopnea, PND, dizziness, presyncope, syncope, or neurologic sequela. The patient is tolerating medications without difficulties and is otherwise without complaint today.   Preventive Screening-Counseling & Management  Alcohol-Tobacco     Smoking Status: never  Current Medications (verified): 1)  Carvedilol 12.5 Mg Tabs (Carvedilol) .... Take One Tablet By Mouth Twice A Day 2)  Vitamin D3 1000 Unit Tabs (Cholecalciferol) .... One By Mouth Daily 3)  Benazepril Hcl 20 Mg Tabs (Benazepril Hcl) .... Take 1 Tablet By Mouth Once A Day 4)  Warfarin Sodium 2.5 Mg Tabs (Warfarin Sodium) .... Use As Directed By Anticoagualtion Clinic 5)  Hydrochlorothiazide 25 Mg Tabs (Hydrochlorothiazide) .... Take One Tablet By Mouth Daily.  Allergies (verified): 1)  ! Penicillin  Comments:  Nurse/Medical Assistant: The patient's medication bottles and allergies were reviewed with the patient and were updated in the Medication and Allergy Lists.  Past History:  Past Medical History: [employer] Jude Medical Accent RF DR model [id number] (serial number [id number]) pacemaker GERD Carotid stenosis (06/2010, right 50-69%, left <50%) Atrial Fibrillation, tachy-brady syndrome, CHADS2 score 2 Hypertension  Social History: Reviewed history from 08/27/2010 and no changes required. Married  Tobacco Use - No.  Alcohol Use - no  Review of Systems       All systems are reviewed and negative except as listed in the HPI.   Vital Signs:  Patient profile:   77 year old female Height:      58  inches Weight:      152 pounds Pulse rate:   77 / minute BP sitting:   188 / 81  (left arm) Cuff size:   regular  Vitals Entered By: Carlye Grippe (September 25, 2010 1:42 PM)  Physical Exam  General:  elderly female, NAD Head:  normocephalic and atraumatic  + alopecia Eyes:  PERRLA/EOM intact; conjunctiva and lids normal. Mouth:  Teeth, gums and palate normal. Oral mucosa normal. Neck:  supple Chest Wall:  pacemaker pocket is well healed Lungs:  Clear bilaterally to auscultation and percussion. Heart:  RRR, no m/r/g Abdomen:  Bowel sounds positive; abdomen soft and non-tender without masses, organomegaly, or hernias noted. No hepatosplenomegaly. Msk:  walks slowly with a cane Extremities:  1+ BLE edema Neurologic:  Alert and oriented x 3. Skin:  Intact without lesions or rashes.   PPM Specifications Following MD:  Hillis Range, MD     PPM Vendor:  [employer] Jude     PPM Model Number:  [id number]     PPM Serial Number:  [id number] PPM DOI:  06/29/2010     PPM Implanting MD:  Hillis Range, MD  Lead 1    Location: [facility]     DOI: 06/29/2010     Model #: [id number]     Serial #: [id number]     Status: active Lead 2    Location: [facility]     DOI: 06/29/2010     Model #: [id number]     Serial #: [id number]     Status: active  Magnet Response Rate:  BOL 100 ERI 85  Indications:  Sick sinus syndrome  PPM Follow Up Battery Voltage:  2.95 V     Battery Est. Longevity:  8.3 YRS     Pacer Dependent:  No       PPM Device Measurements Atrium  Amplitude: 1.8 mV, Impedance: 450 ohms, Threshold: 0.625 V at 0.40 msec Right Ventricle  Amplitude: 10.8 mV, Impedance: 690 ohms, Threshold: 0.5 V at 0.4 msec  Episodes MS Episodes:  1     Percent Mode Switch:  <1%     Coumadin:  No Ventricular High Rate:  0     Atrial Pacing:  95%     Ventricular Pacing:  <1%  Parameters Mode:  DDDR     Lower Rate Limit:  60     Upper Rate Limit:  120 Paced AV Delay:  200     Sensed AV Delay:  150 Next Cardiology Appt Due:   06/26/2011 Tech Comments:  1 AMS EPISODE LASTING 4 SECONDS.  NORMAL DEVICE FUNCTION.  TURNED ACAP CONFIRM ON.  CHANGED SLOPE FROM 10 TO 12 FOR SOB. WALKED PT AND SEEMED TO HELP SOME W/SOB.   ROV IN SEPT 2012 W/JA. PT TO START MERLIN CHECKS AFTER SEPT OV.  Vella Kohler  September 25, 2010 1:49 PM  MD Comments:  agree  Impression & Recommendations:  Problem # 1:  BRADYCARDIA-TACHYCARDIA SYNDROME (ICD-427.81) normal pacemaker function for symptomatic bradycardia changes as above  Problem # 2:  FIBRILLATION, ATRIAL (ICD-427.31) maintaining sinus rhythm continue coumadin  Problem # 3:  ESSENTIAL HYPERTENSION, BENIGN (ICD-401.1) above goal we will add HCTZ 25mg  daily today she will need to follow-up with Dr Sherryll Burger for BMET in 4-6 weeks  Patient Instructions: 1)  Begin HCTZ 25mg  daily 2)  Follow up with a BMET in January with PMD Sherryll Burger) Prescriptions: HYDROCHLOROTHIAZIDE 25 MG TABS (HYDROCHLOROTHIAZIDE) Take one tablet by mouth daily.  #30 x 6   Entered by:   Hoover Brunette, LPN   Authorized by:   Hillis Range, MD   Signed by:   Hoover Brunette, LPN on 71/03/2693   Method used:   Electronically to        [facility]* (retail)       [address]       [place], [place]  [address]       Ph: [phone]       Fax: [phone]   RxID:   [id number]

## 2010-11-15 NOTE — Letter (Signed)
Summary: Appointment- Rescheduled  [facility] [address]   [place], [place] [address]   Phone: [phone]  Fax: [phone]     October 17, 2010 MRN: [id number]     [facility] [address] [place], [place]  [address]     Dear [name],   Due to a change in our office schedule, your appointment on November 15, 2010 at  10:30 am must be changed.    Your new appointment will be November 30, 2010 at 9:30 am.   We look forward to participating in your health care needs.      Sincerely,  Glass blower/designer

## 2010-11-30 ENCOUNTER — Encounter: Payer: Self-pay | Admitting: Cardiology

## 2010-11-30 ENCOUNTER — Ambulatory Visit (INDEPENDENT_AMBULATORY_CARE_PROVIDER_SITE_OTHER): Payer: Medicare Other | Admitting: Cardiology

## 2010-11-30 ENCOUNTER — Encounter (INDEPENDENT_AMBULATORY_CARE_PROVIDER_SITE_OTHER): Payer: Medicare Other

## 2010-11-30 DIAGNOSIS — I4891 Unspecified atrial fibrillation: Secondary | ICD-10-CM

## 2010-11-30 DIAGNOSIS — I10 Essential (primary) hypertension: Secondary | ICD-10-CM

## 2010-11-30 DIAGNOSIS — Z7901 Long term (current) use of anticoagulants: Secondary | ICD-10-CM

## 2010-11-30 LAB — CONVERTED CEMR LAB
Cholesterol, target level: 200 mg/dL
POC INR: 2.6

## 2010-12-05 NOTE — Assessment & Plan Note (Signed)
Summary: 3 MO FU PER 11/14 APPT W/MCDOWELL-JM Harrison SHOULD BE ESTABL...   Visit Type:  Follow-up Primary Provider:  Dr. Kirstie Peri   History of Present Illness: [name] is an 77 year old female with a prior history of tachybradycardia syndrome and atrial fibrillation.  [name] is status post pacemaker implantation.  She's currently atrially paced.  She is a strip hypertension.  She denies any chest pain or shortness of breath.  She has an ejection fraction of 60 to 65% with moderate to severe left atrial enlargement.  [name] reports no out [name]'s presyncope or syncope.  She has mild shortness of breath upon walking.  This is chronic.  Last office visit when she was seen by Dr. all read she was significantly hypertensive and hydrochlorothiazide was added.  Her blood pressure now is normal.  Her only other complaint is that she has developed alopecia. she attributes this to Katrina lisinopril although this seems very unlikely.  He also started before she started taking Coumadin.  Otherwise shows no complications with her Coumadin.  Lipid Management History:      Positive NCEP/ATP III risk factors include female age 36 years old or older and hypertension.  Negative NCEP/ATP III risk factors include non-tobacco-user status.     Preventive Screening-Counseling & Management  Alcohol-Tobacco     Smoking Status: never  Current Medications (verified): 1)  Carvedilol 12.5 Mg Tabs (Carvedilol) .... Take One Tablet By Mouth Twice A Day 2)  Vitamin D3 1000 Unit Tabs (Cholecalciferol) .... One By Mouth Daily 3)  Benazepril Hcl 20 Mg Tabs (Benazepril Hcl) .... Take 1 Tablet By Mouth Once A Day 4)  Warfarin Sodium 2.5 Mg Tabs (Warfarin Sodium) .... Use As Directed By Anticoagualtion Clinic 5)  Hydrochlorothiazide 25 Mg Tabs (Hydrochlorothiazide) .... Take One Tablet By Mouth Daily.  Allergies (verified): 1)  ! Penicillin  Comments:  Nurse/Medical Assistant: [name]'s  medication bottles and allergies were reviewed with [name] and were updated in Katrina Medication and Allergy Lists.  Past History:  Past Surgical History: Last updated: 07/30/2010 Total right knee replacement Laparoscopic cholecystectomy Pacemaker placement  Family History: Last updated: 08/27/2010 Noncontributory  Social History: Last updated: 08/27/2010 Married  Tobacco Use - No.  Alcohol Use - no  Risk Factors: Smoking Status: never (11/30/2010)  Past Medical History: [employer]. Jude Medical Accent RF DR model [id number] (serial number [id number]) pacemaker GERD Carotid stenosis (06/2010, right 50-69%, left <50%) Atrial Fibrillation, tachy-brady syndrome, CHADS2 score 2 Hypertension echocardiogram 2011 ejection fraction 65%, moderate to severe left atrial enlargement.  Review of Systems       [name] complains of weight gain/loss.  [name] denies fatigue, malaise, fever, vision loss, decreased hearing, hoarseness, chest pain, palpitations, shortness of breath, prolonged cough, wheezing, sleep apnea, coughing up blood, abdominal pain, blood in stool, nausea, vomiting, diarrhea, heartburn, incontinence, blood in urine, muscle weakness, joint pain, leg swelling, rash, skin lesions, headache, fainting, dizziness, depression, anxiety, enlarged lymph nodes, easy bruising or bleeding, and environmental allergies.         some difficulty walking  Vital Signs:  Harrison profile:   77 year old female Height:      58 inches Weight:      147 pounds O2 Sat:      99 % on Room air Pulse rate:   63 / minute BP sitting:   127 / 56  (left arm) Cuff size:   regular  Vitals Entered By: Carlye Grippe (November 30, 2010 10:10  AM)  O2 Flow:  Room air  Physical Exam  Additional Exam:  General: Well-developed, well-nourished in no distress head: Normocephalic and atraumatic eyes PERRLA/EOMI intact, conjunctiva and lids normal nose: No deformity or lesions mouth normal dentition, normal  posterior pharynx neck: Supple, no JVD.  No masses, thyromegaly or abnormal cervical nodes lungs: Normal breath sounds bilaterally without wheezing.  Normal percussion Chest: Normal pacemaker pocket site heart: regular rate and rhythm with normal S1 and S2, no S3 or S4.  PMI is normal.  No pathological murmurs abdomen: Normal bowel sounds, abdomen is soft and nontender without masses, organomegaly or hernias noted.  No hepatosplenomegaly musculoskeletal: Back normal, normal gait muscle strength and tone normal pulsus: Pulse is normal in all 4 extremities Extremities: No peripheral pitting edema neurologic: Alert and oriented x 3 skin: Intact without lesions or rashes cervical nodes: No significant adenopathy psychologic: Normal affect    EKG  Procedure date:  11/30/2010  Findings:      atrial paced rhythm.  Incomplete right bundle branch block.  Nonspecific T-wave changes.  Heart rate 64 beats/min.  PPM Specifications Following MD:  Hillis Range, MD     PPM Vendor:  [employer] Jude     PPM Model Number:  [id number]     PPM Serial Number:  [id number] PPM DOI:  06/29/2010     PPM Implanting MD:  Hillis Range, MD  Lead 1    Location: [facility]     DOI: 06/29/2010     Model #: [id number]     Serial #: [id number]     Status: active Lead 2    Location: [facility]     DOI: 06/29/2010     Model #: 1948     Serial #: [id number]     Status: active  Magnet Response Rate:  BOL 100 ERI 85  Indications:  Sick sinus syndrome   PPM Follow Up Pacer Dependent:  No      Episodes Coumadin:  No  Parameters Mode:  DDDR     Lower Rate Limit:  60     Upper Rate Limit:  120 Paced AV Delay:  200     Sensed AV Delay:  150  Impression & Recommendations:  Problem # 1:  PACEMAKER, PERMANENT (ICD-V45.01) normal pacemaker function and followed by Dr. Johney Frame in EP clinic  Problem # 2:  BRADYCARDIA-TACHYCARDIA SYNDROME (ICD-427.81) [name] remains in an organized rhythm with atrial pacing.  Katrina EKG today is no evidence of atrial  fibrillation. Her updated medication list for this problem includes:    Carvedilol 12.5 Mg Tabs (Carvedilol) .Marland Kitchen... Take one tablet by mouth twice a day    Benazepril Hcl 20 Mg Tabs (Benazepril hcl) .Marland Kitchen... Take 1 tablet by mouth once a day    Warfarin Sodium 2.5 Mg Tabs (Warfarin sodium) ..... Use as directed by anticoagualtion clinic  Problem # 3:  ESSENTIAL HYPERTENSION, BENIGN (ICD-401.1) blood pressure is now under better control.  Continue her hydrochlorothiazide.  Otherwise I made no changes in her medical regimen. Her updated medication list for this problem includes:    Carvedilol 12.5 Mg Tabs (Carvedilol) .Marland Kitchen... Take one tablet by mouth twice a day    Benazepril Hcl 20 Mg Tabs (Benazepril hcl) .Marland Kitchen... Take 1 tablet by mouth once a day    Hydrochlorothiazide 25 Mg Tabs (Hydrochlorothiazide) .Marland Kitchen... Take one tablet by mouth daily.  Other Orders: EKG w/ Interpretation (93000)  Lipid Assessment/Plan:      Based on NCEP/ATP III, [name]'s risk factor  category is "0-1 risk factors".  [name]'s lipid goals are as follows: Total cholesterol goal is 200; LDL cholesterol goal is 130; HDL cholesterol goal is 40; Triglyceride goal is 150.    Harrison Instructions: 1)  Your physician wants you to follow-up in: 6 months. You will receive a reminder letter in Katrina mail one-two months in advance. If you don't receive a letter, please call our office to schedule Katrina follow-up appointment. Earnestine Leys) 2)  Your physician recommends that you continue on your current medications as directed. Please refer to Katrina Current Medication list given to you today.

## 2010-12-05 NOTE — Medication Information (Signed)
Summary: ccr-lr/ fhh  Anticoagulant Therapy  Managed by: Vashti Hey, RN PCP: Dr. Kirstie Peri Supervising MD: Myrtis Ser MD, Tinnie Gens Indication 1: Atrial Fibrillation Lab Used: [facility] [place] Site: [place] INR POC 2.6  Dietary changes: no    Health status changes: no    Bleeding/hemorrhagic complications: no    Recent/future hospitalizations: no    Any changes in medication regimen? no    Recent/future dental: no  Any missed doses?: no       Is patient compliant with meds? yes       Allergies: 1)  ! Penicillin  Anticoagulation Management History:      The patient is taking warfarin and comes in today for a routine follow up visit.  Positive risk factors for bleeding include an age of 65 years or older.  The bleeding index is 'intermediate risk'.  Positive CHADS2 values include History of HTN and Age > 51 years old.  Anticoagulation responsible provider: Myrtis Ser MD, Tinnie Gens.  INR POC: 2.6.  Cuvette Lot#: [id number].    Anticoagulation Management Assessment/Plan:      The patient's current anticoagulation dose is Warfarin sodium 2.5 mg tabs: Use as directed by Anticoagualtion Clinic.  The target INR is 2.0-3.0.  The next INR is due 12/28/2010.  Anticoagulation instructions were given to patient.  Results were reviewed/authorized by Vashti Hey, RN.  She was notified by Vashti Hey RN.         Prior Anticoagulation Instructions: INR 2.0 Take coumadin 1 1/2 tablets tonight then resume 1 tablet once daily except 2 tablets on T,Th,Sat  Current Anticoagulation Instructions: INR 2.6 Continue coumadin 2.5mg  once daily except 5mg  on T,Th,Sat

## 2010-12-27 LAB — BASIC METABOLIC PANEL
Calcium: 8.9 mg/dL (ref 8.4–10.5)
GFR calc Af Amer: >60 mL/min (ref >60)
GFR calc non Af Amer: >60 mL/min (ref >60)
Glucose, Bld: 104 mg/dL — ABNORMAL HIGH (ref 70–99)
Sodium: 139 mEq/L (ref 135–145)
Sodium: 141 mEq/L (ref 135–145)

## 2010-12-27 LAB — PROTIME-INR
INR: 0.94 (ref 0.00–1.49)
Prothrombin Time: 12.8 seconds (ref 11.6–15.2)

## 2010-12-27 LAB — CBC
HCT: 32 % — ABNORMAL LOW (ref 36.0–46.0)
Hemoglobin: 10.3 g/dL — ABNORMAL LOW (ref 12.0–15.0)
Hemoglobin: 11.3 g/dL — ABNORMAL LOW (ref 12.0–15.0)
MCH: 30.2 pg (ref 26.0–34.0)
MCH: 30.4 pg (ref 26.0–34.0)
MCHC: 32.1 g/dL (ref 30.0–36.0)
MCHC: 32.2 g/dL (ref 30.0–36.0)
MCV: 94.6 fL (ref 78.0–100.0)
Platelets: 120 10*3/uL — ABNORMAL LOW (ref 150–400)
RDW: 14 % (ref 11.5–15.5)
WBC: 11.7 10*3/uL — ABNORMAL HIGH (ref 4.0–10.5)

## 2010-12-27 LAB — URINALYSIS, ROUTINE W REFLEX MICROSCOPIC
Nitrite: NEGATIVE
Protein, ur: NEGATIVE mg/dL
Urobilinogen, UA: 1 mg/dL (ref 0.0–1.0)
pH: 6 (ref 5.0–8.0)

## 2010-12-27 LAB — APTT: aPTT: 21 seconds — ABNORMAL LOW (ref 24–37)

## 2010-12-27 LAB — URINE MICROSCOPIC-ADD ON

## 2010-12-28 ENCOUNTER — Encounter (INDEPENDENT_AMBULATORY_CARE_PROVIDER_SITE_OTHER): Payer: Medicare Other

## 2010-12-28 ENCOUNTER — Encounter: Payer: Self-pay | Admitting: Cardiology

## 2010-12-28 DIAGNOSIS — Z7901 Long term (current) use of anticoagulants: Secondary | ICD-10-CM

## 2010-12-28 DIAGNOSIS — I4891 Unspecified atrial fibrillation: Secondary | ICD-10-CM

## 2011-01-01 NOTE — Medication Information (Signed)
Summary: ccr-lr  Anticoagulant Therapy  Managed by: Vashti Hey, RN PCP: Dr. Kirstie Peri Supervising MD: Andee Lineman MD, Michelle Piper Indication 1: Atrial Fibrillation Lab Used: [facility] [place] Site: [place] INR POC 2.2  Dietary changes: no    Health status changes: no    Bleeding/hemorrhagic complications: no    Recent/future hospitalizations: no    Any changes in medication regimen? no    Recent/future dental: no  Any missed doses?: no       Is patient compliant with meds? yes       Allergies: 1)  ! Penicillin  Anticoagulation Management History:      The patient is taking warfarin and comes in today for a routine follow up visit.  Positive risk factors for bleeding include an age of 37 years or older.  The bleeding index is 'intermediate risk'.  Positive CHADS2 values include History of HTN and Age > 64 years old.  Anticoagulation responsible provider: Andee Lineman MD, Michelle Piper.  INR POC: 2.2.  Cuvette Lot#: [id number].    Anticoagulation Management Assessment/Plan:      The patient's current anticoagulation dose is Warfarin sodium 2.5 mg tabs: Use as directed by Anticoagualtion Clinic.  The target INR is 2.0-3.0.  The next INR is due 01/25/2011.  Anticoagulation instructions were given to patient.  Results were reviewed/authorized by Vashti Hey, RN.  She was notified by Vashti Hey RN.         Prior Anticoagulation Instructions: INR 2.6 Continue coumadin 2.5mg  once daily except 5mg  on T,Th,Sat  Current Anticoagulation Instructions: INR 2.2 Continue coumadin 2.5mg  once daily except 5mg  on T,Th,Sat

## 2011-01-25 ENCOUNTER — Encounter: Payer: Self-pay | Admitting: Cardiology

## 2011-01-25 ENCOUNTER — Ambulatory Visit (INDEPENDENT_AMBULATORY_CARE_PROVIDER_SITE_OTHER): Payer: Medicare Other | Admitting: *Deleted

## 2011-01-25 DIAGNOSIS — I4891 Unspecified atrial fibrillation: Secondary | ICD-10-CM

## 2011-01-25 DIAGNOSIS — Z7901 Long term (current) use of anticoagulants: Secondary | ICD-10-CM

## 2011-01-25 LAB — POCT INR: INR: 2.7

## 2011-02-04 ENCOUNTER — Other Ambulatory Visit: Payer: Self-pay | Admitting: Cardiology

## 2011-02-22 ENCOUNTER — Ambulatory Visit (INDEPENDENT_AMBULATORY_CARE_PROVIDER_SITE_OTHER): Payer: Medicare Other | Admitting: *Deleted

## 2011-02-22 DIAGNOSIS — I4891 Unspecified atrial fibrillation: Secondary | ICD-10-CM

## 2011-02-22 DIAGNOSIS — Z7901 Long term (current) use of anticoagulants: Secondary | ICD-10-CM

## 2011-02-22 LAB — POCT INR: INR: 2.3

## 2011-03-01 NOTE — Discharge Summary (Signed)
[name], NANCARROW                ACCOUNT NO.:  [id number]   MEDICAL RECORD NO.:  [id number]          PATIENT TYPE:  INP   LOCATION:  5036                         FACILITY:  [facility]   PHYSICIAN:  Claude Manges. Whitfield, M.D.DATE OF BIRTH:  15-Jan-1924   DATE OF ADMISSION:  05/31/2004  DATE OF DISCHARGE:  06/04/2004                                 DISCHARGE SUMMARY   ADMISSION DIAGNOSES:  1.  End-stage osteoarthritis of right hip.  2.  Hypertension, currently uncontrolled.  3.  History of paroxysmal atrial fibrillation.  4.  Symptoms consistent with sleep apnea, unconfirmed.  5.  Reported history of systemic lupus.  6.  Superior kyphotic chest.   DISCHARGE DIAGNOSES:  1.  Status post right total knee arthroplasty.  2.  Hypertension.  3.  Remote history of paroxysmal atrial fibrillation.  4.  Reported history of systemic lupus.  5.  Severe kyphotic chest.  6.  Acute blood loss anemia, secondary to surgery.  7.  Sinus bradycardia.   HISTORY OF PRESENT ILLNESS:  The patient is a 77 year old white female with  a several-year history of progressively worsening right knee pain which has  become significantly worse over the last year.  The patient has difficulty  with range of motion and pain with the knee, giving out at times.  The pain  is described by the patient as a sharp stabbing pain along the joint line.  She does have popping and grinding of the knee.  The pain does keep her  awake at night.  Mechanical symptoms positive for the knee giving way at  time.  The patient does use a rolling walker for support.  X-rays revealed  end-stage osteoarthritis with bone on bone in the medial compartment.   ALLERGIES:  PENICILLIN.   CURRENT MEDICATIONS:  1.  Benazepril 20 mg p.o. daily.  2.  Norvasc 2.5 mg p.o. daily.  3.  Aspirin 81 mg p.o. daily.   SURGICAL PROCEDURE:  The patient was taken to the operating room on July 31, 2004, by Dr. Claude Manges. Whitfield, assisted by Legrand Pitts Duffy,  P.A.  The  patient was placed under general anesthesia with a postoperative nerve  block.  The patient did undergo a right total knee replacement using the  following components:  Dupuy LCS complex medial femoral component, #2  rotating tibial keel platform with a 12.5 mm bridging bearing and a medium  metal back three-peg patella.  The patient tolerated the procedure well and  returned to the recovery room in good and stable condition.   CONSULTATIONS:  1.  Cardiology:  [facility].  2.  PT and OT.  3.  Case management.  4.  Rehabilitation.   HOSPITAL COURSE:  Postoperatively the patient was seen by [facility]  due to a recent sinus bradycardia.  Postoperatively the patient was  asymptomatic and had an increased heart rate with a pulse of 76 beats per  minute.  The patient's hypertension was elevated, secondary to pain.  On postoperative day one the patient had an H&H of 10.8 and 31.3.  She was  asymptomatic.  Blood pressure was running in the 170's/60's.  Heart rate was  in the 70's.  On postoperative day two H&H dropped to 9.7 and 28.3.  The  patient was asymptomatic.  Blood pressure was 160/59, heart rate 71.  Due to  the patient's elevated blood pressure, Norvasc was increased from 2.5 mg  daily to 5 mg daily.  On postoperative day three, the patient's blood  pressure improved after medication adjustments.  On postoperative day four  the patient's vital signs were stable.  The blood pressure was much  improved.  The patient was discharged home in a good and stable condition.   LABORATORY DATA:  Routine labs on admission:  CBC:  All values were within  normal limits.  On postoperative day three H&H was 9.4 and 26.9.  Coags on  admission all were within normal limits.  PT was 18 and INR was 1.7 on  June 04, 2004, on the day of discharge.  Routine chemistries on admission:  All values were within normal limits.  Hepatic enzymes:  All values were  within normal  limits on admission.  Urinalysis on admission was negative.   DISCHARGE MEDICATIONS:  1.  The patient was to resume all home medications except for aspirin along      with Coumadin.  2.  The patient was to adjust Norvasc to 5 mg, one daily.  3.  Add the following medications:  Percocet 5/325 mg, one tab q.12h.  4.  Coumadin 5 mg daily as directed.  5.  Stool softeners p.r.n.   ACTIVITY:  The patient is 50% partial weightbearing on the right.   DIET:  No restrictions.   WOUND CARE:  The patient is to change the dressing daily.  Gentiva R.N. to  remove the staples and place Steri-Strips 10 days post-discharge.  This will  be on September 1st or September 2nd.   DISCHARGE INSTRUCTIONS:  The patient is to call our office if there are any  signs of infection.   SPECIAL INSTRUCTIONS:  CPM 0-90 degrees six to eight hours daily.  Increase  by 10 degrees daily.   FOLLOWUP:  The patient needs followup with Dr. Claude Manges. Whitfield in the  office in approximately two weeks from the day of surgery.  The patient is  to call the office, [phone] to make an appointment.      GC/MEDQ  D:  08/06/2004  T:  08/06/2004  Job:  [id number]

## 2011-03-01 NOTE — Discharge Summary (Signed)
NAMEAIMIE, [name]                          ACCOUNT NO.:  [id number]   MEDICAL RECORD NO.:  [id number]                   PATIENT TYPE:  INP   LOCATION:  3735                                 FACILITY:  [facility]   PHYSICIAN:  Doylene Canning. Ladona Ridgel, M.D.               DATE OF BIRTH:  12-23-1923   DATE OF ADMISSION:  05/28/2004  DATE OF DISCHARGE:  05/29/2004                                 DISCHARGE SUMMARY   PROBLEM LIST:  1. Sinus bradycardia.  2. Hypertension.  3. Osteoarthritis.  4. Obstructive sleep apnea.  5. History of lupus.  6. One episode of paroxysmal atrial fibrillation.  7. Obesity.  8. Pending knee replacement.   HISTORY OF PRESENT ILLNESS:  The patient admitted to [facility] when she  presented for preadmission for a total knee replacement on Thursday and  noted to have slow heart rate, EKG showing sinus brady in the 40s, [facility] was called in to consult and Dr. Dorethea Clan admitted the patient for  cardiac workup.  During this time the patient has been sinus rhythm/sinus  brady on the monitor.   PAST MEDICAL HISTORY:  In 2004 she had an adenosine Cardiolite which showed  no significant ischemia, ejection fraction of 73, echo at that time also  showed EF of 60% with mild diastolic dysfunction and left atrial  enlargement.  Questionable whether or not the patient needed permanent  pacemaker put in this admission.  Also note echocardiogram was done this  admission and the results are pending.  Presently temperature 97.5, heart  rate 55, respirations 16, blood pressure 132/60, 98% on room air.  Sodium  142, potassium 4.1, BUN 20, creatinine 0.8, glucose 102, heart rate regular.  Dr. Lewayne Bunting called in to consult questionable pacemaker, determined the  patient was not a candidate for a permanent pacemaker at this time.  When he  had the patient do hand grip exercises, heart rate increased from 50 to 75,  so he feels that the patient is stable for total knee surgery.   We will be  glad to  follow the patient perioperative if needed for her knee surgery.  The  patient is being discharged home on her current medications which include 1  baby aspirin, Norvasc 5 mg 1 p.o. daily, Lotensin 20 mg 1 p.o. daily.  The  patient agrees to follow up with her orthopedic doctor pending total knee  replacement this Thursday.      Dorian Pod, NP                       Doylene Canning. Ladona Ridgel, M.D.    MB/MEDQ  D:  05/29/2004  T:  05/29/2004  Job:  [id number]   cc:   Clelia Croft, MD  [facility]   Claude Manges. Cleophas Dunker, M.D.  [address] [place]  [place] [address]  Fax: [phone]

## 2011-03-01 NOTE — Consult Note (Signed)
NAMEIBTISAM, BENGE                          ACCOUNT NO.:  [id number]   MEDICAL RECORD NO.:  [id number]                   PATIENT TYPE:  INP   LOCATION:  3735                                 FACILITY:  [facility]   PHYSICIAN:  Doylene Canning. Ladona Ridgel, M.D.               DATE OF BIRTH:  27-Dec-1923   DATE OF CONSULTATION:  05/29/2004  DATE OF DISCHARGE:                                   CONSULTATION   REQUESTING PHYSICIANS:  1. Dr. Vida Roller.  2. Dr. Willa Rough.   REASON FOR CONSULTATION:  The consultation is requested by Dr. Dionicio Stall  and Dr. Jerral Bonito for evaluation of bradycardia.   HISTORY OF PRESENT ILLNESS:  The patient is a very pleasant 77 year old  woman with a history of bradycardia in the past.  She was found to be fairly  bradycardic back in November of 2004 and her Toprol was discontinued.  She  has also had a remote history of paroxysmal atrial fibrillation.  She is  hypertensive.  She was recently seen in the preop evaluation anesthesia  clinic and found to be bradycardic.  The patient was admitted for  observation and consideration for pacemaker insertion.  She has a previous  Cardiolite stress test demonstrating no ischemia with a normal LV systolic  function.  The patient denies any history of syncope or near-syncope, cough  or hemoptysis.   PAST MEDICAL HISTORY:  Her past medical history is as previously noted;  there is also a history of sleep apnea and a longstanding arthritis.   MEDICINES:  Medicines include Norvasc, aspirin and benazepril.   SOCIAL HISTORY:  The patient is married and lives in [place].  She denies  tobacco or ethanol use.   FAMILY HISTORY:  Family history is notable for mother dying of leukemia and  father with AAA.  She had a brother with esophageal cancer.  She has 1  brother with a pacemaker insertion.   REVIEW OF SYSTEMS:  Review of systems is negative for vision or hearing  problems.  She denies any syncope, chest pain, shortness of  breath, cough or  hemoptysis.  She denies nausea, vomiting, diarrhea or constipation.  She  denies skin changes.  She does have chronic arthritis, worse in the right  knee.  She denies any neurologic changes.  The rest of her review of systems  was negative.   PHYSICAL EXAMINATION:  GENERAL:  On exam, she is a pleasant, well-appearing,  elderly, obese woman in no distress.  VITAL SIGNS:  Blood pressure was 140/70, the pulse was 55 and regular, the  respirations were 18, the weight was 169 pounds.  HEENT:  Normocephalic and nontraumatic.  The pupils were equal and round.  The oropharynx was moist.  The sclerae were anicteric.  NECK:  The neck revealed no jugular venous distention.  There was no  thyromegaly.  The trachea was midline.  LUNGS:  The  lungs were clear bilaterally to auscultation.  CARDIOVASCULAR:  Exam revealed a regular rate and rhythm with normal S1 and  S2.  ABDOMINAL EXAM:  Obese, nontender and nondistended.  There was no  organomegaly.  EXTREMITIES:  Extremities demonstrated no cyanosis, clubbing or edema.  Pulses were 2+ and symmetric.  NEUROLOGIC:  Exam was nonfocal.   ACCESSORY CLINICAL DATA:  EKG demonstrates sinus bradycardia.   IMPRESSION:  1  Sinus bradycardia (apparently asymptomatic).  1. Remote history of paroxysmal atrial fibrillation.  2. Severe arthritis, for upcoming knee replacement.  3. Hypertension.  4. Obesity.   DISCUSSION:  Today, I had Ms. Meeuwsen undergo a hand grip exercise testing.  During hand grip for approximately 3 minutes, her resting heart rate  increased from 45 beats a minute up to 75 beats per minute.  I recommend  that patient proceed with her previously scheduled surgery.  There certainly  is a very small chance of requiring a permanent pacemaker, but I think this  is quite unlikely at the present time.  We will plan to let her be  discharged home and set up for this as previously scheduled.                                                Doylene Canning. Ladona Ridgel, M.D.    GWT/MEDQ  D:  05/29/2004  T:  05/29/2004  Job:  [id number]   cc:   Kirstie Peri, MD  [address]  [place] [address]  Fax: [phone]   Jonelle Sidle, M.D. [facility]   Claude Manges. Cleophas Dunker, M.D.  [address] [place]  [place] [address]  Fax: [phone]

## 2011-03-01 NOTE — H&P (Signed)
NAMEWILSON, [name]                          ACCOUNT NO.:  [id number]   MEDICAL RECORD NO.:  [id number]                   PATIENT TYPE:  INP   LOCATION:  NA                                   FACILITY:  [facility]   PHYSICIAN:  Claude Manges. Cleophas Dunker, M.D.            DATE OF BIRTH:  Aug 02, 1924   DATE OF ADMISSION:  05/31/2004  DATE OF DISCHARGE:                                HISTORY & PHYSICAL   CHIEF COMPLAINT:  Right knee pain.   HISTORY OF PRESENT ILLNESS:  The patient is a 77 year old, white female with  several year history of progressively worsening right knee pain that  significantly worsened over the last year.  She has significant difficulty  with range of motion and pain with the knee giving out at times.  She  describes the pain as a sharp, stabbing pain along the joint line.  She does  have popping and grinding.  She does have significant night pain.  The pain  does cause the knee to give out at times and she has been using a rolling  walker for support.   X-rays reveal end-stage osteoarthritis with bone on bone medial compartment.   ALLERGIES:  PENICILLIN.   CURRENT MEDICATIONS:  1. Benazepril 20 mg p.o. q.d.  2. Norvasc 2.5 mg p.o. q.d.  3. Aspirin 81 mg p.o. q.d.   PAST MEDICAL HISTORY:  1. Hypertension, currently poorly controlled at 190/70.  2. Per last cardiac evaluation, history of resolving self-converting atrial     fibrillation.  3. Probable sleep apnea.  4. Reported history of systemic lupus.   PAST SURGICAL HISTORY:  Tonsillectomy and C-section without any  complications.   SOCIAL HISTORY:  The patient is a 77 year old, short in stature, slightly  heavyset, white female.  Denies any history of smoking.  No alcohol use.  She is married.  She has one grown child.  She lives in a [place] house.  She is retired from a Teacher, music.   PRIMARY CARE PHYSICIAN:  Dr. Clelia Croft in [place], phone [phone].  [facility] in [place], phone [phone].   FAMILY HISTORY:   Mother deceased from complications from cardiac disease and  leukemia.  Father deceased at the age of 48 from dissection of an aortic  aneurysm.  Two brothers deceased, one from unknown cancer.  The second from  cardiac disease.  One sister deceased with combination and history of  unknown cancer and cardiac disease.   REVIEW OF SYMPTOMS:  HEENT:  Positive for glasses.  GASTROINTESTINAL:  She  does have occasional problems with reflux for which she just uses Tums.  CARDIOPULMONARY:  She reports a history of chest pain this last Fall which  was evaluated by cardiac workup with [facility].  Otherwise review of systems  are negative.   PHYSICAL EXAMINATION:  VITAL SIGNS:  Height 4 feet 11 inches, weight 165  pounds, blood pressure 190/70, rechecked 182/70, pulse 60  and regular,  respirations 12, afebrile.  GENERAL:  This is a short in stature, significantly kyphotic, heavyset,  white female.  She ambulates very slowly with the use of a rolling walker.  She needs assistance getting on and off of the exam table.  She does not  appear to be in any distress at the current time.  HEENT:  Pupils equal round and reactive to light, extraocular movements  intact.  External ears without deformities.  Nasal septum was midline.  Oral  buccal mucosa was pink and moist without lesions.  NECK:  Supple, no palpable lymphadenopathy.  Thyroid region was nontender.  She had good range of motion of her cervical spine without any difficulty.  CHEST:  The patient had a significantly kyphotic with large AP diameter  chest.  Lung sounds were clear to auscultation bilaterally.  HEART:  Regular rate and rhythm with no murmurs, rubs or gallops.  ABDOMEN:  Soft, nontender.  Obese, normal bowel sounds.  CVA region was  nontender.  EXTREMITIES:  Upper extremities were symmetrically sized and shape.  She had  full range of motion of her shoulders, elbows and wrists.  Motor strength  was 5/5.  Lower extremities with  bilateral hips at full extension, flexion  up to 110 degrees with 20 degrees internal/external rotation without any  difficulty.  Right knee was without any signs of erythema.  No palpable  effusions.  She was significantly tender along the medial joint line.  She  had about 10 degrees of valgus varus laxity.  She had full extension and  flexion back to 115 degrees.  Left knee was without any signs of erythema,  no effusion, no significant joint line tenderness.  She had full extension.  Flexion back to 120 degrees.  No instability.  Bilateral calves are  nontender.  Ankles were symmetrical with good dorsiflexion in plantars.  Carotid pulses were 2+ with no bruits.  Radial pulses were 2+.  Dorsalis  pedis pulses were 1+.  She had various scattered varicosities of her lower  extremity, no edema.  No significant venous stasis changes.  NEUROLOGIC:  The patient was conscious, alert and appropriate in  conversation with examiner.  Cranial nerves 2-12 grossly intact.  She had no  gross neurologic defects noted.  She was grossly intact to light touch and  sensation from head to toe.  BREASTS/RECTAL/GU:  Deferred at this time.  SKIN:  She had significant eczema changes about the feet and hands.   IMPRESSION:  1. End-stage osteoarthritis, right hip.  2. Hypertension, currently uncontrolled.  3. History of paroxysmal atrial fibrillation.  4. Symptoms consistent with sleep apnea, unconfirmed.  5. Reported history of systemic lupus.  6. Severe kyphotic chest.   PLAN:  I have contacted [facility] with the last cardiac evaluation  which was included in the patient's information provided to the hospital.  She had a low risk stress test Cardiolite in June 2004, with no further  workup of her probable sleep apnea.  The patient also had newly diagnosed  atrial fibrillation in September 2004, with spontaneous conversion to normal sinus rhythm.  The patient has also been asked to contact her  primary care  physician for evaluation of her elevated blood pressure at this time.  The  patient will otherwise undergo all routine labs and tests prior to having a  right total knee arthroplasty by Dr. Norlene Campbell on May 31, 2004.      Jamelle Rushing, P.A.  Claude Manges. Cleophas Dunker, M.D.   RWK/MEDQ  D:  05/21/2004  T:  05/21/2004  Job:  [id number]

## 2011-03-01 NOTE — Op Note (Signed)
[name], [name]                          ACCOUNT NO.:  [id number]   MEDICAL RECORD NO.:  [id number]                   PATIENT TYPE:  INP   LOCATION:  4728                                 FACILITY:  [facility]   PHYSICIAN:  Claude Manges. Cleophas Dunker, M.D.            DATE OF BIRTH:  28-Sep-1924   DATE OF PROCEDURE:  05/31/2004  DATE OF DISCHARGE:                                 OPERATIVE REPORT   PREOPERATIVE DIAGNOSIS:  End stage osteoarthritis of the right knee.   POSTOPERATIVE DIAGNOSIS:  End stage osteoarthritis of the right knee.   PROCEDURE:  Right total knee replacement.   SURGEON:  Claude Manges. Cleophas Dunker, M.D.   ASSISTANT:  Legrand Pitts. Duffy, P.A.   ANESTHESIA:  General orotracheal with postoperative femoral nerve block.   COMPLICATIONS:  None.   COMPONENTS:  DePuy LCS complex, medium femoral component, a #2 rotating  tibial keeled platform with initially a 10 and then a 12.5 mm bridging  bearing and the medium metal back, three pegged patella, all secured with  polymethacrylate.   PROCEDURE:  With the patient comfortable on the operating table and under  general orotracheal anesthesia, the nursing staff inserted a Foley catheter.  The right lower extremity was placed in a thigh tourniquet. The leg was then  prepped with Betadine scrub and Duraprep from the tourniquet to the mid  foot.  Sterile draping was performed.  With the extremity still elevated, it  was Esmarch exsanguinated with the proximal tourniquet at 325 mmHg.   A midline longitudinal incision was made centered about the patella  extending from the superior pouch to the tibial tubercle.  Via sharp  dissection the incision was carried down to the subcutaneous tissue.  The  first layer of capsule was incised in the midline and a medial parapatellar  incision was made with the Bovie.  There was a clear yellow joint effusion.   The patella was everted 180 degrees and the knee flexed to 90 degrees.  There was a fixed varus  position and accordingly a lateral and medial  release was performed.  There was complete absence of articular cartilage in  the medial femoral condyle and large osteophytes along the medial and  lateral femoral condyles as well as the medial tibial plateau.  The  osteophytes were removed and sizing of the femoral component was performed.  The medium size appeared to be the best fit.   The initial cuts were made transversely on the patella with the tibial jig.  The initial cut was too short and accordingly we increased the cut by 2 mm  using the supplemental jig. At that point, we felt that the 10 mm flexion  gap was perfect.   Subsequent cuts were then made on the femur using the femoral jig with the 4  degree distal femoral valgus cut.  The ACL and PCL were sacrificed.  The MCL  and LCL remained intact  throughout the operative procedure. Lamina spreaders  were inserted to remove medial and lateral menisci and any remnants of bone  along the posterior, medial and lateral tibial plateau.  Osteophytes were  removed from the posterior femoral condyle using the curved osteotome.  There was a Baker's cyst that was excised.   Retractors were then placed about the tibia.  We sized the tibia to a #2 and  the #2 jig was then applied. The center cut was made followed by the keeled  cut.  We trialed a 10 mm bridging bearing and felt this was an excellent  construct with flexion and extension and no malrotation of the component.   The patella was prepared by removing 7 mm of bone leaving 13 mm of  thickness.  The tripeg jig was applied making the three holes in the patella  and then the trial patella was applied.  It did sublux laterally and a  lateral release was performed. Otherwise, the construct appeared to be  perfect.   The trial components were removed and the joint was copiously irrigated with  jet saline. The final components were then inserted with  polymethylmethacrylate.  Extraneous  methacrylate was removed with a  Glorious Peach.   As we placed the knee through a full range of motion, I felt we had  hyperextension and opening with some varus and valgus and perhaps the  ligaments had stretched and the capsule had stretched during insertion. I  removed the 10 mm bridging bearing and inserted a 12.5 and felt that this  was a perfect construct without hyperextension and no opening of the varus  or valgus stress.   The joint was explored for any evidence of loose material or methacrylate  and there was none. It was then copiously irrigated with saline solution.  Bone wax was applied to the bleeding bone surfaces and to the drill holes.  The tourniquet was deflated and gross bleeders were Bovie coagulated. We had  a nice dry field, but excellent capillary refill to the wound.  The wound  was again irrigated with jet saline.  The deep capsule was closed with  interrupted #1 Ethibond.  The superficial capsule was closed with 0 and 2-0  Vicryl.  The skin was closed with skin clips.  A sterile, dry bulky dressing  was applied.   The patient received a postoperative femoral nerve block for pain control.   The patient was returned to the post-anesthesia care unit in satisfactory  condition.                                               Claude Manges. Cleophas Dunker, M.D.    PWW/MEDQ  D:  05/31/2004  T:  05/31/2004  Job:  [id number]

## 2011-03-01 NOTE — Assessment & Plan Note (Signed)
[facility]                          [facility]ICE NOTE   NAME:Feinstein, CHARNA NEEB                       MRN:          [id number]  DATE:01/05/2007                            DOB:          Nov 08, 1923    HISTORY OF PRESENT ILLNESS:  The patient is a 77 year old female with a  history of paroxysmal atrial fibrillation and dyspnea. The patient has  remained in normal sinus rhythm.  She has actually done quite well.  She  was seen in our office for chest pain 3 weeks ago, which appeared to be  more gastroesophageal reflux in nature.  She denies any exertional chest  pain or dyspnea.  The patient had an adenosine Cardiolite study done  previously which showed no evidence of ischemia.  Next the patient  stated that she bought generic omeprazole which has done well and she  has had no recurrent symptoms of reflex or chest pain.   MEDICATIONS:  1. Aspirin 81 mg daily.  2. Norvasc 5 mg p.o. daily.  3. Benazepril 20 mg p.o. daily.  4. Hydrochlorothiazide 25 mg p.o. daily.  5. Omeprazole 200 mg p.o. daily.   PHYSICAL EXAMINATION:  VITAL SIGNS:  Blood pressure is 137/67.  Heart  rate 69 beats per minute.  Weight is 164 pounds.  NECK:  Normal carotid upstroke and no carotid bruits.  LUNGS:  Are clear. Breath sounds normal.  HEART:  Regular rate and rhythm.  Normal S1, S2.  ABDOMEN:  Soft.  EXTREMITIES:  No cyanosis, clubbing or edema.   PROBLEM:  1. Dyspnea, stable.      a.     Status post adenosine Cardiolite study.  Negative for       ischemia.      b.     Hypertensive heart disease with diastolic dysfunction.  2. Paroxysmal atrial fibrillation (The patient is remaining in normal      sinus rhythm).  3. History of bradycardia.  4. Status post right total knee replacement.  5. Left anterior hemiblock and right bundle branch block.  6. Asymptomatic bradycardia.  7. Gastroesophageal reflux disease.   PLAN:  1. The patient has done quite well.   Symptoms of chest tightness      appears to be more reflux in nature.  2. The patient will continue on omeprazole.  3. No further ischemic testing is indicated.  4. Review the EKG demonstrated no significant acute changes.  5. The patient can follow up with [place] in 6 months.     Learta Codding, MD,FACC  Electronically Signed    GED/MedQ  DD: 01/05/2007  DT: 01/05/2007  Job #: [id number]   cc:   Kirstie Peri, MD

## 2011-03-01 NOTE — H&P (Signed)
NAME:  YUSRA, RAVERT                          ACCOUNT NO.:  [id number]   MEDICAL RECORD NO.:  [id number]                   PATIENT TYPE:  INP   LOCATION:  NA                                   FACILITY:  [facility]   PHYSICIAN:  Vida Roller, M.D.                DATE OF BIRTH:  [birth year]   DATE OF ADMISSION:  05/28/2004  DATE OF DISCHARGE:                                HISTORY & PHYSICAL   PRIMARY DOCTOR:  Dr. Clelia Croft in [place], [place].   CARDIOLOGIST:  Willa Rough, M.D.   REFERRING PHYSICIAN:  Burna Forts, M.D. at the Anesthesia Department  at [facility].   HISTORY OF PRESENT ILLNESS:  Ms. Pruett is a 77 year old woman whom we have  followed in our [facility] office for significant bradycardia.  She was admitted  back in November 2004, taken off her Toprol XL, and has done well since  then.  She has had a perfusion study and an echo which both were  unrevealing.  She comes to attention today because she is preparing for a  total knee replacement of the right knee on May 31, 2004, and had a  preoperative electrocardiogram which was abnormal with significant  bradycardia.  She tells me she has never had any syncope or near syncopal  episodes, but has a strange feeling in the center of her chest which occurs  when her heart rate goes down low when she takes her pulse. It  is quite  uncomfortable, it lasts for brief periods of time and she has not been  evaluated for this until today.   PAST MEDICAL HISTORY:  Significant for hypertension, osteoarthritis,  obstructive sleep apnea, history of lupus which is not well described.  She  has had one episode of paroxysmal atrial fibrillation and has not had any  recurrence of that.  When she was in the hospital, in September 2004, she  had an adenosine Cardiolite which showed no significant ischemia and an  ejection fraction of 73%.  Subsequent echo showed an ejection fraction of  60% with mild diastolic dysfunction and left  atrial enlargement.  Her blood  pressure is not terribly well controlled.   MEDICATIONS:  1. Norvasc 5 mg daily.  2. Baby aspirin 81 mg daily.  3. Benazepril 10 mg 2 pills once a day.   SOCIAL HISTORY:  She lives in [place] with her husband who was here when I  interviewed her and provided some information.  She is married.  She has one  child who is healthy.  She does not smoke.  She does not drink any alcohol.  She does not use any illicit drugs.  She does not have a specific diet.  Her  mother died of leukemia at age 69.  Her father had a AAA and died age 62.  She has one brother who had esophageal cancer and passed away, one brother  who had a permanent pacemaker at a relatively young age, and one sister who  had cancer.   REVIEW OF SYSTEMS:  GENERAL:  Negative, aside from her knee pain and the  palpitations as previously described.  All her other review of systems are  negative.   PHYSICAL EXAMINATION:  GENERAL:  She is a well-developed, well-nourished,  mildly obese white female in no apparent distress.  She is alert and  oriented x4.  VITAL SIGNS:  Her temperature is 97.0, pulse is 53, respiratory rate 20, her  blood pressure is 178/76, saturating 96% on room air.  HEENT:  Unremarkable.  NECK:  Supple.  There is no jugular venous distention or carotid bruits.  There is no lymphadenopathy seen.  LUNGS:  Clear to auscultation.  CARDIAC:  Slow, but normal first and second heart sounds.  She does have a  fourth heart sound.  There is a soft systolic murmur heard best at the base.  There are no diastolic murmurs noted.  ABDOMEN:  Obese, but soft, nontender, normoactive bowel sounds.  GUR:  Deferred.  EXTREMITIES:  Without clubbing, cyanosis, or edema.  Her pulses are 1 to 2+.  NEUROLOGIC:  Nonfocal.   Her chest x-ray is pending.  Electrocardiogram shows sinus bradycardia at a  rate of 46.  She has a right bundle branch block and a left anterior  fascicular block with a leftward  axis at -56.  Her PR interval is 124 msec,  her QRS duration is 118 msec, her QT corrected is 397 msec.  She has no  ischemic ST-T wave changes and there are no Q-waves concerning for an old  myocardial infarction.   LABORATORY DATA:  White blood cell count 7.8, H&H of 13 and 38, platelet  count 247.  Sodium 140, potassium 4.7, chloride 109, bicarb 26, BUN 22,  creatinine 0.8, and her blood sugar is 97.  Her PTT is 25, INR of 0.9.   IMPRESSION:  This is a lady who has sinus bradycardia with concern for sick  sinus syndrome.  She has a right bundle branch block, which appears to be  chronic, on her EKGs which are unchanged from September 2004.  She does have  a history of paroxysmal atrial fibrillation and she has significant  hypertension.   PLAN:  My plan is to admit her for 24 hours of observation, ask my  electrophysiologic colleagues to see her for consideration for a potential  pacemaker.  She will get an echocardiogram today to ensure the left  ventricular systolic function is still normal.  The concern is obviously the  timing of her pacemaker, if she needs that.  Obviously, with her surgery  scheduled for Thursday, if she needs the pacemaker put in, she will need to  delay the surgery.  If she does not need the pacemaker and the  electrophysiologists feel that it is safe to send her to surgery, then she  probably can go ahead and have her knee replaced on Thursday.  We will  probably need to increase her medication in the hospital for her  hypertension; however, we will observe over a period of time before we make  that decision.                                                Vida Roller, M.D.    JH/MEDQ  D:  05/28/2004  T:  05/28/2004  Job:  [id number]

## 2011-03-01 NOTE — Assessment & Plan Note (Signed)
[facility]                            [facility] NOTE   NAME:[name], [name]                       MRN:          [id number]  DATE:05/26/2006                            DOB:          Apr 18, 1924    HISTORY OF PRESENT ILLNESS:  The patient is a 77 year old female with a  history of paroxysmal atrial fibrillation and dyspnea.  The patient has  remained in normal sinus rhythm.  She denies any chest pain or shortness of  breath.  She did have a single episode of choking sensation in the throat  which was approximately three weeks ago.  However, the patient has been able  to do her house chores and been very active thereafter with no restrictions  and no chest pain.  She had a prior negative ischemia workup.  She also  reports no palpitations, orthopnea, or PND.  The patient presents for  routine followup visit today.   CURRENT MEDICATIONS:  1. Aspirin 81 mg a day.  2. Norvasc 5 mg a day.  3. Benzepril 20 mg a day.  4. Hydrochlorothiazide 25 mg p.o. daily.   PHYSICAL EXAMINATION:  VITAL SIGNS:  Blood pressure 110/62,. heart rate 64,  weight 169 pounds.  NECK:  Reveals normal carotid upstroke and no carotid bruits.  LUNGS:  Clear bilaterally.  HEART:  Reveals regular rate and rhythm with normal S1 and S2.  No murmur,  rubs, or gallops.  ABDOMEN:  Soft.  EXTREMITIES:  No clubbing, cyanosis or edema.  NEUROLOGIC:  The patient is alert and oriented, grossly normal, nonfocal.   PROBLEMS:  1. Dyspnea, stable.      a.     Status post adenosine Cardiolite showing low risk.      b.     Hypertensive heart disease with diastolic dysfunction, improved.  2. Paroxysmal atrial fibrillation.      a.     Normal sinus rhythm.      b.     No recurrent palpitations.      c.     History of bradycardia.  3. Status post right total knee replacement August 2006.  4. Left anterior hemiblock and right bundle branch, chronic.  5. History of bradycardia,  asymptomatic.  6. Gastroesophageal reflux disease, status post endoscopy.   PLAN:  1. The patient is doing well from a cardiovascular perspective.  I do not      think her sensation of throat tightness three weeks ago was related to      angina.  I have offered the patient a prescription for p.r.n.      nitroglycerin which she declined.  2. No further ischemia testing is indicated at the present time.  3. I suspect the patient's above symptoms were secondary to reflux disease      and esophageal      spasm.  4. The patient will follow up with [place] for routine visit in six months.  Learta Codding, MD, [facility]   GED/MedQ  DD:  05/26/2006  DT:  05/27/2006  Job #:  [id number]

## 2011-03-22 ENCOUNTER — Ambulatory Visit (INDEPENDENT_AMBULATORY_CARE_PROVIDER_SITE_OTHER): Payer: Medicare Other | Admitting: *Deleted

## 2011-03-22 DIAGNOSIS — I4891 Unspecified atrial fibrillation: Secondary | ICD-10-CM

## 2011-03-22 DIAGNOSIS — Z7901 Long term (current) use of anticoagulants: Secondary | ICD-10-CM

## 2011-04-16 ENCOUNTER — Encounter: Payer: Self-pay | Admitting: Cardiology

## 2011-04-19 ENCOUNTER — Ambulatory Visit (INDEPENDENT_AMBULATORY_CARE_PROVIDER_SITE_OTHER): Payer: Medicare Other | Admitting: *Deleted

## 2011-04-19 DIAGNOSIS — Z7901 Long term (current) use of anticoagulants: Secondary | ICD-10-CM

## 2011-04-19 DIAGNOSIS — I4891 Unspecified atrial fibrillation: Secondary | ICD-10-CM

## 2011-05-06 ENCOUNTER — Other Ambulatory Visit: Payer: Self-pay | Admitting: Adult Health

## 2011-05-06 NOTE — Telephone Encounter (Signed)
.   Requested Prescriptions   Pending Prescriptions Disp Refills  . benazepril (LOTENSIN) 20 MG tablet      Sig: Take 1 tablet (20 mg total) by mouth daily.

## 2011-05-17 ENCOUNTER — Ambulatory Visit (INDEPENDENT_AMBULATORY_CARE_PROVIDER_SITE_OTHER): Payer: Medicare Other | Admitting: *Deleted

## 2011-05-17 DIAGNOSIS — Z7901 Long term (current) use of anticoagulants: Secondary | ICD-10-CM

## 2011-05-17 DIAGNOSIS — I4891 Unspecified atrial fibrillation: Secondary | ICD-10-CM

## 2011-05-27 ENCOUNTER — Other Ambulatory Visit: Payer: Self-pay | Admitting: Internal Medicine

## 2011-05-27 NOTE — Telephone Encounter (Signed)
Pharmacy calling, pt is in pharmacy to pick up RX. Please call in RX to pharmacy.

## 2011-06-14 ENCOUNTER — Ambulatory Visit (INDEPENDENT_AMBULATORY_CARE_PROVIDER_SITE_OTHER): Payer: Medicare Other | Admitting: *Deleted

## 2011-06-14 DIAGNOSIS — I4891 Unspecified atrial fibrillation: Secondary | ICD-10-CM

## 2011-06-14 DIAGNOSIS — Z7901 Long term (current) use of anticoagulants: Secondary | ICD-10-CM

## 2011-06-19 ENCOUNTER — Other Ambulatory Visit: Payer: Self-pay | Admitting: *Deleted

## 2011-06-19 MED ORDER — WARFARIN SODIUM 2.5 MG PO TABS: 2.5000 mg | ORAL_TABLET | ORAL | Status: DC

## 2011-07-05 ENCOUNTER — Ambulatory Visit (INDEPENDENT_AMBULATORY_CARE_PROVIDER_SITE_OTHER): Payer: Medicare Other | Admitting: *Deleted

## 2011-07-05 ENCOUNTER — Encounter: Payer: Self-pay | Admitting: Cardiology

## 2011-07-05 ENCOUNTER — Ambulatory Visit (INDEPENDENT_AMBULATORY_CARE_PROVIDER_SITE_OTHER): Payer: Medicare Other | Admitting: Cardiology

## 2011-07-05 DIAGNOSIS — R0602 Shortness of breath: Secondary | ICD-10-CM

## 2011-07-05 DIAGNOSIS — R079 Chest pain, unspecified: Secondary | ICD-10-CM

## 2011-07-05 DIAGNOSIS — Z7901 Long term (current) use of anticoagulants: Secondary | ICD-10-CM

## 2011-07-05 DIAGNOSIS — R072 Precordial pain: Secondary | ICD-10-CM

## 2011-07-05 DIAGNOSIS — I4891 Unspecified atrial fibrillation: Secondary | ICD-10-CM

## 2011-07-05 MED ORDER — ISOSORBIDE MONONITRATE 30 MG PO TB24: 30.0000 mg | ORAL_TABLET | ORAL | Status: DC

## 2011-07-05 MED ORDER — NITROGLYCERIN 0.4 MG SL SUBL: 0.4000 mg | SUBLINGUAL_TABLET | SUBLINGUAL | Status: DC | PRN

## 2011-07-05 NOTE — Patient Instructions (Signed)
   Follow up as scheduled.  Start Imdur (isosorbide) 30 mg daily.  Use Nitroglycerin 0.4 mg sublingual as needed. Your physician has requested that you have an echocardiogram. Echocardiography is a painless test that uses sound waves to create images of your heart. It provides your doctor with information about the size and shape of your heart and how well your heart's chambers and valves are working. This procedure takes approximately one hour. There are no restrictions for this procedure.

## 2011-07-08 DIAGNOSIS — R079 Chest pain, unspecified: Secondary | ICD-10-CM | POA: Insufficient documentation

## 2011-07-08 NOTE — Progress Notes (Signed)
HPI The patient is an 77 year old female with a history of tachybradycardia syndrome and atrial fibrillation. The patient has remained in normal sinus rhythm. She denies any tachycardia or palpitations. She does report some shortness of breath on exertion as well as chest pressure. She has no prior history of ischemic heart disease. Blood pressure somewhat poorly controlled. It actually was her husband who brought to my attention that she has episodes of exertional tightness and shortness of breath.  Allergies  Allergen Reactions  . Penicillins     Current Outpatient Prescriptions on File Prior to Visit  Medication Sig Dispense Refill  . benazepril (LOTENSIN) 20 MG tablet Take 20 mg by mouth 2 (two) times daily.       . carvedilol (COREG) 12.5 MG tablet Take 12.5 mg by mouth 2 (two) times daily with a meal.        . Cholecalciferol (VITAMIN D3) 1000 UNITS tablet Take 1,000 Units by mouth daily.        . hydrochlorothiazide 25 MG tablet TAKE ONE TABLET BY MOUTH BY MOUTH DAILY. - EMERGENCY REFILL FAXED DR  30 tablet  1  . warfarin (COUMADIN) 2.5 MG tablet Take 1 tablet (2.5 mg total) by mouth as directed.  45 tablet  3    Past Medical History  Diagnosis Date  . Pacemaker     [employer] RF DR model [id number] (serial number [id number])  . GERD (gastroesophageal reflux disease)   . Carotid stenosis 06/2010    Right 50-69%, left <50%  . Atrial fibrillation   . Tachy-brady syndrome     CHADS2 score 2  . Hypertension   . Abnormal echocardiogram 2011    EF 65%, moderate to severe left atrial enlargement    Past Surgical History  Procedure Date  . Total knee arthroplasty     Right  . Laparoscopic cholecystectomy   . Pacemaker insertion     No family history on file.  History   Social History  . Marital Status: Married    Spouse Name: N/A    Number of Children: N/A  . Years of Education: N/A   Occupational History  . Not on file.   Social History Main Topics  .  Smoking status: Never Smoker   . Smokeless tobacco: Not on file  . Alcohol Use: No  . Drug Use: Not on file  . Sexually Active: Not on file   Other Topics Concern  . Not on file   Social History Narrative   Married   [id number] positives as outlined above. The remainder of the 18  point review of systems is negative  PHYSICAL EXAM BP 149/76  Pulse 60  Ht 5' (1.524 m)  Wt 146 lb (66.225 kg)  BMI 28.51 kg/m2  General: Well-developed, well-nourished in no distress Head: Normocephalic and atraumatic Eyes:PERRLA/EOMI intact, conjunctiva and lids normal Ears: No deformity or lesions Mouth:normal dentition, normal posterior pharynx Neck: Supple, no JVD.  No masses, thyromegaly or abnormal cervical nodes Lungs: Normal breath sounds bilaterally without wheezing.  Normal percussion Cardiac: regular rate and rhythm with normal S1 and S2, no S3 or S4.  PMI is normal.  No pathological murmurs Abdomen: Normal bowel sounds, abdomen is soft and nontender without masses, organomegaly or hernias noted.  No hepatosplenomegaly MSK: Back normal, normal gait muscle strength and tone normal Vascular: Pulse is normal in all 4 extremities Extremities: No peripheral pitting edema Neurologic: Alert and oriented x 3 Skin: Intact without lesions or rashes Lymphatics: No  significant adenopathy Psychologic: Normal affect   ECG: Normal sinus rhythm  ASSESSMENT AND PLAN

## 2011-07-08 NOTE — Progress Notes (Deleted)
HPI   Allergies  Allergen Reactions  . Penicillins     Current Outpatient Prescriptions on File Prior to Visit  Medication Sig Dispense Refill  . benazepril (LOTENSIN) 20 MG tablet Take 20 mg by mouth 2 (two) times daily.       . carvedilol (COREG) 12.5 MG tablet Take 12.5 mg by mouth 2 (two) times daily with a meal.        . Cholecalciferol (VITAMIN D3) 1000 UNITS tablet Take 1,000 Units by mouth daily.        . hydrochlorothiazide 25 MG tablet TAKE ONE TABLET BY MOUTH BY MOUTH DAILY. - EMERGENCY REFILL FAXED DR  30 tablet  1  . warfarin (COUMADIN) 2.5 MG tablet Take 1 tablet (2.5 mg total) by mouth as directed.  45 tablet  3    Past Medical History  Diagnosis Date  . Pacemaker     [employer] Accent RF DR model [id number] (serial number [id number])  . GERD (gastroesophageal reflux disease)   . Carotid stenosis 06/2010    Right 50-69%, left <50%  . Atrial fibrillation   . Tachy-brady syndrome     CHADS2 score 2  . Hypertension   . Abnormal echocardiogram 2011    EF 65%, moderate to severe left atrial enlargement    Past Surgical History  Procedure Date  . Total knee arthroplasty     Right  . Laparoscopic cholecystectomy   . Pacemaker insertion     No family history on file.  History   Social History  . Marital Status: Married    Spouse Name: N/A    Number of Children: N/A  . Years of Education: N/A   Occupational History  . Not on file.   Social History Main Topics  . Smoking status: Never Smoker   . Smokeless tobacco: Not on file  . Alcohol Use: No  . Drug Use: Not on file  . Sexually Active: Not on file   Other Topics Concern  . Not on file   Social History Narrative   Married    PHYSICAL EXAM BP 149/76  Pulse 60  Ht 5' (1.524 m)  Wt 146 lb (66.225 kg)  BMI 28.51 kg/m2   ECG:  ASSESSMENT AND PLAN

## 2011-07-08 NOTE — Assessment & Plan Note (Signed)
Substernal chest pain associated with shortness of breath. Patient will be started Imdur 30 mg a day and when necessary nitroglycerin as needed. We'll get an echocardiogram in the meanwhile. If she develops unstable symptoms of accelerated angina with further aggressive medical therapy we will proceed with cardiac catheterization. The patient will be closely followed and we'll see her back in 6-8 weeks.

## 2011-07-08 NOTE — Assessment & Plan Note (Signed)
Patient remained in AV sequential paced rhythm. No recurrent palpitations. Continue Coumadin.

## 2011-07-16 ENCOUNTER — Encounter: Payer: Self-pay | Admitting: *Deleted

## 2011-07-17 ENCOUNTER — Other Ambulatory Visit (INDEPENDENT_AMBULATORY_CARE_PROVIDER_SITE_OTHER): Payer: Medicare Other | Admitting: *Deleted

## 2011-07-17 DIAGNOSIS — R072 Precordial pain: Secondary | ICD-10-CM

## 2011-07-17 DIAGNOSIS — R079 Chest pain, unspecified: Secondary | ICD-10-CM

## 2011-07-17 DIAGNOSIS — I4891 Unspecified atrial fibrillation: Secondary | ICD-10-CM

## 2011-07-17 DIAGNOSIS — R0602 Shortness of breath: Secondary | ICD-10-CM

## 2011-08-02 ENCOUNTER — Ambulatory Visit (INDEPENDENT_AMBULATORY_CARE_PROVIDER_SITE_OTHER): Payer: Medicare Other | Admitting: *Deleted

## 2011-08-02 DIAGNOSIS — Z7901 Long term (current) use of anticoagulants: Secondary | ICD-10-CM

## 2011-08-02 DIAGNOSIS — I4891 Unspecified atrial fibrillation: Secondary | ICD-10-CM

## 2011-08-27 ENCOUNTER — Ambulatory Visit (INDEPENDENT_AMBULATORY_CARE_PROVIDER_SITE_OTHER): Payer: Medicare Other | Admitting: Cardiology

## 2011-08-27 ENCOUNTER — Ambulatory Visit (INDEPENDENT_AMBULATORY_CARE_PROVIDER_SITE_OTHER): Payer: Medicare Other | Admitting: *Deleted

## 2011-08-27 ENCOUNTER — Encounter: Payer: Self-pay | Admitting: Cardiology

## 2011-08-27 DIAGNOSIS — I10 Essential (primary) hypertension: Secondary | ICD-10-CM

## 2011-08-27 DIAGNOSIS — I4891 Unspecified atrial fibrillation: Secondary | ICD-10-CM

## 2011-08-27 DIAGNOSIS — R079 Chest pain, unspecified: Secondary | ICD-10-CM

## 2011-08-27 DIAGNOSIS — Z7901 Long term (current) use of anticoagulants: Secondary | ICD-10-CM

## 2011-08-27 LAB — POCT INR: INR: 2.6

## 2011-08-27 MED ORDER — BENAZEPRIL HCL 20 MG PO TABS: 20.0000 mg | ORAL_TABLET | Freq: Every day | ORAL | Status: DC

## 2011-08-27 MED ORDER — AMLODIPINE BESYLATE 5 MG PO TABS: 5.0000 mg | ORAL_TABLET | Freq: Every evening | ORAL | Status: DC

## 2011-08-27 NOTE — Assessment & Plan Note (Signed)
As outlined above. Will treat medically first.

## 2011-08-27 NOTE — Assessment & Plan Note (Signed)
Blood pressures now decreased. Given the fact that the patient is not a diabetic and has normal LV function I decreased her benazepril to 20 mg by mouth daily. I provided in habits amlodipine 5 mg by mouth daily for increased angina relief. Unless the patient does not respond to medical therapy and/or she has unstable symptoms I would prefer to treat her medically at this point. However she has worsening symptoms we'll proceed with cardiac catheterization.

## 2011-08-27 NOTE — Patient Instructions (Signed)
Your physician wants you to follow-up in: 3 months. You will receive a reminder letter in the mail one-two months in advance. If you don't receive a letter, please call our office to schedule the follow-up appointment. Decrease Benazepril to 20 mg daily. Start Norvasc (amlodipine) 5 mg every evening.

## 2011-08-27 NOTE — Progress Notes (Signed)
CC: Followup visit for chest pain  HPI: The patient is a 77 year old female with history of tachybradycardia syndrome, paroxysmal atrial fibrillation, status post pacemaker implantation. She was recently seen in the office because of symptoms of chest pain and shortness of breath on exertion. It appeared that the patient was having symptoms of angina. We performed an echocardiogram in her LV function was normal and there were no segmental wall motion abnormalities. The patient during that visit was placed in isosorbide mononitrate. She states that her symptoms have been completely resolved. My initial thought was to proceed with catheterization if she did not respond to medical therapy. However the patient states that she is markedly improved. Her blood pressures also under better control. She did not have to use any sublingual nitroglycerin. She reports no palpitations, presyncope or dizziness.   PMH: reviewed and listed in Problem List in Electronic Records (and see below)  Allergies/SH/FHX : available in Electronic Records for review  Medications: Current Outpatient Prescriptions  Medication Sig Dispense Refill  . carvedilol (COREG) 12.5 MG tablet Take 12.5 mg by mouth 2 (two) times daily with a meal.        . Cholecalciferol (VITAMIN D3) 1000 UNITS tablet Take 1,000 Units by mouth daily.        . hydrochlorothiazide 25 MG tablet TAKE ONE TABLET BY MOUTH BY MOUTH DAILY. - EMERGENCY REFILL FAXED DR  30 tablet  1  . isosorbide mononitrate (IMDUR) 30 MG CR tablet Take 1 tablet (30 mg total) by mouth every morning.  30 tablet  6  . nitroGLYCERIN (NITROSTAT) 0.4 MG SL tablet Place 1 tablet (0.4 mg total) under the tongue every 5 (five) minutes as needed for chest pain.  12 tablet  2  . warfarin (COUMADIN) 2.5 MG tablet Take 1 tablet (2.5 mg total) by mouth as directed.  45 tablet  3  . amLODipine (NORVASC) 5 MG tablet Take 1 tablet (5 mg total) by mouth every evening.  30 tablet  6  . benazepril  (LOTENSIN) 20 MG tablet Take 1 tablet (20 mg total) by mouth daily.  30 tablet  11    ROS: No nausea or vomiting. No fever or chills.No melena or hematochezia.No bleeding.No claudication  Physical Exam: BP 105/67  Pulse 65  Ht 4\' 10"  (1.473 m)  Wt 145 lb (65.772 kg)  BMI 30.31 kg/m2 General: Well-nourished white female in no apparent distress Neck: Normal carotid upstroke no carotid bruits. Lungs: Clear breath sounds bilaterally Cardiac: Regular rate and rhythm with normal S1-S2 with no murmur rubs or gallops Vascular: 1+ pitting edema right lower extremity/chronic. Normal distal pulses  Skin: Warm and dry  12lead ECG: Normal sinus rhythm with right bundle branch block Limited bedside ECHO:N/A   Assessment and Plan

## 2011-08-27 NOTE — Assessment & Plan Note (Signed)
Paroxysmal atrial fibrillation. The patient is in normal sinus rhythm. She's tolerating Coumadin therapy. She reports no palpitations.

## 2011-09-24 ENCOUNTER — Ambulatory Visit (INDEPENDENT_AMBULATORY_CARE_PROVIDER_SITE_OTHER): Payer: Medicare Other | Admitting: *Deleted

## 2011-09-24 DIAGNOSIS — I4891 Unspecified atrial fibrillation: Secondary | ICD-10-CM

## 2011-09-24 DIAGNOSIS — Z7901 Long term (current) use of anticoagulants: Secondary | ICD-10-CM

## 2011-09-24 LAB — POCT INR: INR: 2.9

## 2011-10-23 ENCOUNTER — Ambulatory Visit (INDEPENDENT_AMBULATORY_CARE_PROVIDER_SITE_OTHER): Payer: Medicare Other | Admitting: Internal Medicine

## 2011-10-23 ENCOUNTER — Encounter: Payer: Self-pay | Admitting: Internal Medicine

## 2011-10-23 DIAGNOSIS — D649 Anemia, unspecified: Secondary | ICD-10-CM

## 2011-10-23 DIAGNOSIS — I4891 Unspecified atrial fibrillation: Secondary | ICD-10-CM

## 2011-10-23 DIAGNOSIS — Z950 Presence of cardiac pacemaker: Secondary | ICD-10-CM

## 2011-10-23 LAB — PACEMAKER DEVICE OBSERVATION
AL AMPLITUDE: 1.5 mv
AL THRESHOLD: 0.75 V
BAMS-0001: 150 {beats}/min
BAMS-0003: 70 {beats}/min
BATTERY VOLTAGE: 2.9478 V
RV LEAD AMPLITUDE: 8.4 mv

## 2011-10-23 NOTE — Assessment & Plan Note (Signed)
Normal pacemaker function See Pace Art report No changes today  

## 2011-10-23 NOTE — Progress Notes (Signed)
PCP:  Kirstie Peri, MD Primary Cardiologist:  DeGent  The patient presents today for routine electrophysiology followup.  Since last being seen in our clinic, the patient reports doing very well.  She remains reasonably active despite her age.  Today, she denies symptoms of palpitations, chest pain, shortness of breath,dizziness, presyncope, syncope, or neurologic sequela.  The patient feels that she is tolerating medications without difficulties and is otherwise without complaint today.   Past Medical History  Diagnosis Date  . GERD (gastroesophageal reflux disease)   . Carotid stenosis 06/2010    Right 50-69%, left <50%  . Atrial fibrillation     [employer] RF DR model [id number] (serial number [id number])  . Tachy-brady syndrome     CHADS2 score 2  . Hypertension   . Abnormal echocardiogram 2011    EF 65%, moderate to severe left atrial enlargement   Past Surgical History  Procedure Date  . Total knee arthroplasty     Right  . Laparoscopic cholecystectomy   . Pacemaker insertion 06/29/10    SJM implanted by Dr Johney Frame for tachy/brady syndrome    Current Outpatient Prescriptions  Medication Sig Dispense Refill  . amLODipine (NORVASC) 5 MG tablet Take 1 tablet (5 mg total) by mouth every evening.  30 tablet  6  . benazepril (LOTENSIN) 20 MG tablet Take 1 tablet (20 mg total) by mouth daily.  30 tablet  11  . carvedilol (COREG) 12.5 MG tablet Take 12.5 mg by mouth 2 (two) times daily with a meal.        . Cholecalciferol (VITAMIN D3) 1000 UNITS tablet Take 1,000 Units by mouth daily.        . hydrochlorothiazide 25 MG tablet TAKE ONE TABLET BY MOUTH BY MOUTH DAILY. - EMERGENCY REFILL FAXED DR  30 tablet  1  . isosorbide mononitrate (IMDUR) 30 MG CR tablet Take 1 tablet (30 mg total) by mouth every morning.  30 tablet  6  . nitroGLYCERIN (NITROSTAT) 0.4 MG SL tablet Place 1 tablet (0.4 mg total) under the tongue every 5 (five) minutes as needed for chest pain.  12 tablet  2  .  warfarin (COUMADIN) 2.5 MG tablet Take 1 tablet (2.5 mg total) by mouth as directed.  45 tablet  3    Allergies  Allergen Reactions  . Penicillins     History   Social History  . Marital Status: Married    Spouse Name: Greggory Stallion    Number of Children: N/A  . Years of Education: N/A   Occupational History  . RETIRED    Social History Main Topics  . Smoking status: Never Smoker   . Smokeless tobacco: Never Used  . Alcohol Use: No  . Drug Use: Not on file  . Sexually Active: Not on file   Other Topics Concern  . Not on file   Social History Narrative  . No narrative on file     Physical Exam: Filed Vitals:   10/23/11 1004  BP: 110/60  Pulse: 60  Height: 4\' 10"  (1.473 m)  Weight: 148 lb (67.132 kg)  SpO2: 98%    GEN- The patient is elderly appearing, alert and oriented x 3 today.   Head- normocephalic, atraumatic Eyes-  Sclera clear, conjunctiva pink Ears- hearing intact Oropharynx- clear Neck- supple, no JVP Lymph- no cervical lymphadenopathy Lungs- Clear to ausculation bilaterally, normal work of breathing Chest- pacemaker pocket is well healed Heart- Regular rate and rhythm, no murmurs, rubs or gallops, PMI not laterally  displaced GI- soft, NT, ND, + BS Extremities- no clubbing, cyanosis, or edema  Pacemaker interrogation- reviewed in detail today,  See PACEART report  Assessment and Plan:

## 2011-10-23 NOTE — Assessment & Plan Note (Signed)
maintaining sinus rhythm, most of the time (<1% mode switches) Continue coumadin

## 2011-11-05 ENCOUNTER — Ambulatory Visit (INDEPENDENT_AMBULATORY_CARE_PROVIDER_SITE_OTHER): Payer: Medicare Other | Admitting: *Deleted

## 2011-11-05 DIAGNOSIS — Z7901 Long term (current) use of anticoagulants: Secondary | ICD-10-CM

## 2011-11-05 DIAGNOSIS — I4891 Unspecified atrial fibrillation: Secondary | ICD-10-CM

## 2011-11-22 ENCOUNTER — Other Ambulatory Visit: Payer: Self-pay | Admitting: *Deleted

## 2011-11-22 MED ORDER — WARFARIN SODIUM 2.5 MG PO TABS: 2.5000 mg | ORAL_TABLET | ORAL | Status: DC

## 2011-12-17 ENCOUNTER — Ambulatory Visit (INDEPENDENT_AMBULATORY_CARE_PROVIDER_SITE_OTHER): Payer: Medicare Other | Admitting: *Deleted

## 2011-12-17 ENCOUNTER — Encounter: Payer: Medicare Other | Admitting: *Deleted

## 2011-12-17 DIAGNOSIS — I4891 Unspecified atrial fibrillation: Secondary | ICD-10-CM

## 2011-12-17 DIAGNOSIS — Z7901 Long term (current) use of anticoagulants: Secondary | ICD-10-CM

## 2011-12-17 LAB — POCT INR: INR: 3.3

## 2012-01-21 ENCOUNTER — Ambulatory Visit (INDEPENDENT_AMBULATORY_CARE_PROVIDER_SITE_OTHER): Payer: Medicare Other | Admitting: *Deleted

## 2012-01-21 DIAGNOSIS — I4891 Unspecified atrial fibrillation: Secondary | ICD-10-CM

## 2012-01-21 DIAGNOSIS — Z7901 Long term (current) use of anticoagulants: Secondary | ICD-10-CM

## 2012-01-23 ENCOUNTER — Encounter: Payer: Medicare Other | Admitting: *Deleted

## 2012-01-28 ENCOUNTER — Encounter: Payer: Self-pay | Admitting: *Deleted

## 2012-02-03 ENCOUNTER — Ambulatory Visit (INDEPENDENT_AMBULATORY_CARE_PROVIDER_SITE_OTHER): Payer: Medicare Other | Admitting: *Deleted

## 2012-02-03 ENCOUNTER — Encounter: Payer: Self-pay | Admitting: Internal Medicine

## 2012-02-03 DIAGNOSIS — I495 Sick sinus syndrome: Secondary | ICD-10-CM

## 2012-02-03 DIAGNOSIS — I4891 Unspecified atrial fibrillation: Secondary | ICD-10-CM

## 2012-02-04 LAB — REMOTE PACEMAKER DEVICE
AL AMPLITUDE: 1.9 mv
ATRIAL PACING PM: 96
DEVICE MODEL PM: 7165695
RV LEAD IMPEDENCE PM: 710 Ohm
VENTRICULAR PACING PM: 1

## 2012-02-10 NOTE — Progress Notes (Signed)
Remote pacer check  

## 2012-02-14 ENCOUNTER — Encounter: Payer: Self-pay | Admitting: *Deleted

## 2012-02-18 ENCOUNTER — Ambulatory Visit (INDEPENDENT_AMBULATORY_CARE_PROVIDER_SITE_OTHER): Payer: Medicare Other | Admitting: *Deleted

## 2012-02-18 DIAGNOSIS — Z7901 Long term (current) use of anticoagulants: Secondary | ICD-10-CM

## 2012-02-18 DIAGNOSIS — I4891 Unspecified atrial fibrillation: Secondary | ICD-10-CM

## 2012-02-18 LAB — POCT INR: INR: 2.2

## 2012-03-07 ENCOUNTER — Other Ambulatory Visit: Payer: Self-pay | Admitting: Cardiology

## 2012-03-10 ENCOUNTER — Other Ambulatory Visit: Payer: Self-pay | Admitting: Cardiology

## 2012-03-20 ENCOUNTER — Ambulatory Visit (INDEPENDENT_AMBULATORY_CARE_PROVIDER_SITE_OTHER): Payer: Medicare Other | Admitting: *Deleted

## 2012-03-20 DIAGNOSIS — Z7901 Long term (current) use of anticoagulants: Secondary | ICD-10-CM

## 2012-03-20 DIAGNOSIS — I4891 Unspecified atrial fibrillation: Secondary | ICD-10-CM

## 2012-03-20 LAB — POCT INR: INR: 2.3

## 2012-04-02 ENCOUNTER — Other Ambulatory Visit: Payer: Self-pay | Admitting: Cardiology

## 2012-04-24 ENCOUNTER — Ambulatory Visit (INDEPENDENT_AMBULATORY_CARE_PROVIDER_SITE_OTHER): Payer: Medicare Other | Admitting: *Deleted

## 2012-04-24 DIAGNOSIS — Z7901 Long term (current) use of anticoagulants: Secondary | ICD-10-CM

## 2012-04-24 DIAGNOSIS — I4891 Unspecified atrial fibrillation: Secondary | ICD-10-CM

## 2012-04-24 LAB — POCT INR: INR: 1.9

## 2012-04-28 NOTE — Progress Notes (Signed)
I can not close without your name listed.

## 2012-05-07 ENCOUNTER — Ambulatory Visit (INDEPENDENT_AMBULATORY_CARE_PROVIDER_SITE_OTHER): Payer: Medicare Other | Admitting: *Deleted

## 2012-05-07 ENCOUNTER — Encounter: Payer: Self-pay | Admitting: Internal Medicine

## 2012-05-07 DIAGNOSIS — I495 Sick sinus syndrome: Secondary | ICD-10-CM

## 2012-05-11 LAB — REMOTE PACEMAKER DEVICE
AL AMPLITUDE: 2.1 mv
AL IMPEDENCE PM: 410 Ohm
AL THRESHOLD: 0.75 V
BATTERY VOLTAGE: 2.95 V
DEVICE MODEL PM: 7165695
RV LEAD AMPLITUDE: 8.6 mv
RV LEAD IMPEDENCE PM: 710 Ohm

## 2012-05-18 ENCOUNTER — Encounter: Payer: Self-pay | Admitting: *Deleted

## 2012-05-22 ENCOUNTER — Encounter (INDEPENDENT_AMBULATORY_CARE_PROVIDER_SITE_OTHER): Payer: Medicare Other | Admitting: *Deleted

## 2012-05-22 DIAGNOSIS — I4891 Unspecified atrial fibrillation: Secondary | ICD-10-CM

## 2012-05-22 DIAGNOSIS — Z7901 Long term (current) use of anticoagulants: Secondary | ICD-10-CM

## 2012-06-19 ENCOUNTER — Ambulatory Visit (INDEPENDENT_AMBULATORY_CARE_PROVIDER_SITE_OTHER): Payer: Medicare Other | Admitting: *Deleted

## 2012-06-19 DIAGNOSIS — Z7901 Long term (current) use of anticoagulants: Secondary | ICD-10-CM

## 2012-06-19 DIAGNOSIS — I4891 Unspecified atrial fibrillation: Secondary | ICD-10-CM

## 2012-07-17 ENCOUNTER — Ambulatory Visit (INDEPENDENT_AMBULATORY_CARE_PROVIDER_SITE_OTHER): Payer: Medicare Other | Admitting: *Deleted

## 2012-07-17 DIAGNOSIS — Z7901 Long term (current) use of anticoagulants: Secondary | ICD-10-CM

## 2012-07-17 DIAGNOSIS — I4891 Unspecified atrial fibrillation: Secondary | ICD-10-CM

## 2012-07-17 LAB — POCT INR: INR: 2.5

## 2012-07-17 MED ORDER — WARFARIN SODIUM 2.5 MG PO TABS: ORAL_TABLET | ORAL | Status: DC

## 2012-08-10 ENCOUNTER — Ambulatory Visit (INDEPENDENT_AMBULATORY_CARE_PROVIDER_SITE_OTHER): Payer: Medicare Other | Admitting: *Deleted

## 2012-08-10 ENCOUNTER — Encounter: Payer: Self-pay | Admitting: Internal Medicine

## 2012-08-10 ENCOUNTER — Encounter: Payer: Self-pay | Admitting: *Deleted

## 2012-08-10 DIAGNOSIS — Z950 Presence of cardiac pacemaker: Secondary | ICD-10-CM

## 2012-08-10 DIAGNOSIS — I4891 Unspecified atrial fibrillation: Secondary | ICD-10-CM

## 2012-08-10 LAB — REMOTE PACEMAKER DEVICE
AL AMPLITUDE: 1.9 mv
AL THRESHOLD: 0.625 V
BAMS-0001: 150 {beats}/min
BATTERY VOLTAGE: 2.95 V
DEVICE MODEL PM: 7165695
RV LEAD AMPLITUDE: 7.4 mv
RV LEAD THRESHOLD: 0.5 V

## 2012-08-14 ENCOUNTER — Ambulatory Visit (INDEPENDENT_AMBULATORY_CARE_PROVIDER_SITE_OTHER): Payer: Medicare Other | Admitting: *Deleted

## 2012-08-14 DIAGNOSIS — I4891 Unspecified atrial fibrillation: Secondary | ICD-10-CM

## 2012-08-14 DIAGNOSIS — Z7901 Long term (current) use of anticoagulants: Secondary | ICD-10-CM

## 2012-09-08 ENCOUNTER — Encounter: Payer: Self-pay | Admitting: *Deleted

## 2012-09-11 ENCOUNTER — Encounter: Payer: Self-pay | Admitting: Cardiology

## 2012-09-15 ENCOUNTER — Encounter: Payer: Self-pay | Admitting: Cardiology

## 2012-09-15 ENCOUNTER — Ambulatory Visit (INDEPENDENT_AMBULATORY_CARE_PROVIDER_SITE_OTHER): Payer: Medicare Other | Admitting: Cardiology

## 2012-09-15 ENCOUNTER — Ambulatory Visit (INDEPENDENT_AMBULATORY_CARE_PROVIDER_SITE_OTHER): Payer: Medicare Other | Admitting: *Deleted

## 2012-09-15 VITALS — BP 144/69 | HR 62 | Ht 60.0 in | Wt 143.8 lb

## 2012-09-15 DIAGNOSIS — I10 Essential (primary) hypertension: Secondary | ICD-10-CM

## 2012-09-15 DIAGNOSIS — I4891 Unspecified atrial fibrillation: Secondary | ICD-10-CM

## 2012-09-15 DIAGNOSIS — I495 Sick sinus syndrome: Secondary | ICD-10-CM

## 2012-09-15 DIAGNOSIS — Z7901 Long term (current) use of anticoagulants: Secondary | ICD-10-CM

## 2012-09-15 NOTE — Patient Instructions (Addendum)

## 2012-09-15 NOTE — Progress Notes (Signed)
Clinical Summary [name] is a 78 y.o.female presenting for followup. She is a former patient of Dr. Andee Lineman, prefers to stay with [facility]. She was last seen in the office by Dr. Johney Frame in January. She has been managed medically for possible angina symptoms. Further ischemic testing has not been pursued.  Echocardiogram in October of last year reported moderate LVH with LVEF 60-65%, trivial mitral regurgitation, mild left atrial enlargement.  She states that she has been feeling well, has had no falls, no bleeding problems. Does use a  rolling walker when she is out of her house. She does not report any progressive angina symptoms.   Allergies  Allergen Reactions  . Penicillins     Current Outpatient Prescriptions  Medication Sig Dispense Refill  . amLODipine (NORVASC) 5 MG tablet TAKE 1 TABLET BY MOUTH EVERY EVENING.  30 tablet  6  . benazepril (LOTENSIN) 20 MG tablet Take 20 mg by mouth daily.      . carvedilol (COREG) 12.5 MG tablet Take 12.5 mg by mouth 2 (two) times daily with a meal.        . Cholecalciferol (VITAMIN D3) 1000 UNITS tablet Take 1,000 Units by mouth daily.        . hydrochlorothiazide 25 MG tablet TAKE ONE TABLET BY MOUTH BY MOUTH DAILY. - EMERGENCY REFILL FAXED DR  30 tablet  1  . isosorbide mononitrate (IMDUR) 30 MG 24 hr tablet TAKE 1 TABLET BY MOUTH EVERY MORNING  30 tablet  6  . warfarin (COUMADIN) 2.5 MG tablet Take 1 tablet daily except 2 tablets on Tuesdays, Thursdays and Saturdays Managed by Misty Stanley.      . [DISCONTINUED] benazepril (LOTENSIN) 20 MG tablet Take 1 tablet (20 mg total) by mouth daily.  30 tablet  11  . nitroGLYCERIN (NITROSTAT) 0.4 MG SL tablet Place 0.4 mg under the tongue every 5 (five) minutes as needed.      . [DISCONTINUED] nitroGLYCERIN (NITROSTAT) 0.4 MG SL tablet Place 1 tablet (0.4 mg total) under the tongue every 5 (five) minutes as needed for chest pain.  12 tablet  2  . [DISCONTINUED] warfarin (COUMADIN) 2.5 MG tablet Take 1 tablet  daily except 2 tablets on Tuesdays, Thursdays and Saturdays  45 tablet  3    Past Medical History  Diagnosis Date  . GERD (gastroesophageal reflux disease)   . Carotid stenosis 06/2010    Right 50-69%, left <50%  . Atrial fibrillation     [employer] Accent RF DR model [id number] (serial number [id number])  . Tachy-brady syndrome     CHADS2 score 2  . Essential hypertension, benign   . Left atrial enlargement     Past Surgical History  Procedure Date  . Total knee arthroplasty     Right  . Laparoscopic cholecystectomy   . Pacemaker insertion 06/29/10    SJM implanted by Dr Johney Frame for tachy/brady syndrome    Social History Ms. Hemmingway reports that she has never smoked. She has never used smokeless tobacco. Ms. Brisbane reports that she does not drink alcohol.  Review of Systems Otherwise negative.  Physical Examination Filed Vitals:   09/15/12 1416  BP: 144/69  Pulse: 62   Filed Weights   09/15/12 1416  Weight: 143 lb 12.8 oz (65.227 kg)   No acute distress. HEENT: Conjunctiva and lids normal, oropharynx clear. Neck: Supple, no elevated JVP or carotid bruits, no thyromegaly. Lungs: Clear to auscultation, nonlabored breathing at rest. Cardiac: Irregular, no S3, soft systolic murmur,  no pericardial rub. Abdomen: Soft, nontender, bowel sounds present, no guarding or rebound. Extremities: No pitting edema, distal pulses 2+. Skin: Warm and dry. Musculoskeletal: Significant kyphosis. Neuropsychiatric: Alert and oriented x3, affect grossly appropriate.   Problem List and Plan   FIBRILLATION, ATRIAL Symptomatically stable. Continue present regimen including Coumadin.  ESSENTIAL HYPERTENSION, BENIGN Blood pressure is mildly elevated today. At present, no change to current regimen. Keep followup with Dr. Sherryll Burger.  BRADYCARDIA-TACHYCARDIA SYNDROME Maintain device check with Dr. Johney Frame.    Jonelle Sidle, M.D., F.A.C.C.

## 2012-09-15 NOTE — Assessment & Plan Note (Signed)
Symptomatically stable. Continue present regimen including Coumadin.

## 2012-09-15 NOTE — Assessment & Plan Note (Signed)
Blood pressure is mildly elevated today. At present, no change to current regimen. Keep followup with Dr. Sherryll Burger.

## 2012-09-15 NOTE — Assessment & Plan Note (Signed)
Maintain device check with Dr. Johney Frame.

## 2012-10-27 ENCOUNTER — Ambulatory Visit (INDEPENDENT_AMBULATORY_CARE_PROVIDER_SITE_OTHER): Payer: Medicare Other | Admitting: *Deleted

## 2012-10-27 DIAGNOSIS — I4891 Unspecified atrial fibrillation: Secondary | ICD-10-CM

## 2012-10-27 DIAGNOSIS — Z7901 Long term (current) use of anticoagulants: Secondary | ICD-10-CM

## 2012-10-27 LAB — POCT INR: INR: 2.1

## 2012-11-09 ENCOUNTER — Other Ambulatory Visit: Payer: Self-pay | Admitting: Cardiology

## 2012-11-09 MED ORDER — ISOSORBIDE MONONITRATE ER 30 MG PO TB24: 30.0000 mg | ORAL_TABLET | Freq: Every day | ORAL | Status: DC

## 2012-11-11 ENCOUNTER — Encounter: Payer: Medicare Other | Admitting: Internal Medicine

## 2012-12-02 ENCOUNTER — Encounter: Payer: Self-pay | Admitting: Internal Medicine

## 2012-12-02 ENCOUNTER — Ambulatory Visit (INDEPENDENT_AMBULATORY_CARE_PROVIDER_SITE_OTHER): Payer: Medicare Other | Admitting: Internal Medicine

## 2012-12-02 VITALS — BP 95/62 | HR 66 | Ht 59.0 in | Wt 139.0 lb

## 2012-12-02 DIAGNOSIS — I495 Sick sinus syndrome: Secondary | ICD-10-CM

## 2012-12-02 LAB — PACEMAKER DEVICE OBSERVATION
AL THRESHOLD: 0.625 V
BAMS-0001: 150 {beats}/min
BAMS-0003: 70 {beats}/min
RV LEAD AMPLITUDE: 9.5 mv

## 2012-12-02 NOTE — Assessment & Plan Note (Signed)
BP is low today.  She denies bleeding and feels better presently. I will stop norvasc.  She says she feels better today. Declines labs.  She will followup with Dr Sherryll Burger next week.  SHe will call us if things worsen in the interim.

## 2012-12-02 NOTE — Assessment & Plan Note (Signed)
Normal pacemaker function See Pace Art report No changes today  

## 2012-12-02 NOTE — Progress Notes (Signed)
PCP:  Kirstie Peri, MD Primary Cardiologist:  Diona Browner  The patient presents today for routine electrophysiology followup.  Since last being seen in our clinic, the patient reports doing very well.  She remains reasonably active despite her age.  She had mild dizziness last night but feels better today.   Today, she denies symptoms of palpitations, chest pain, shortness of breath,dizziness, presyncope, syncope, or neurologic sequela.  The patient feels that she is tolerating medications without difficulties and is otherwise without complaint today.   Past Medical History  Diagnosis Date  . GERD (gastroesophageal reflux disease)   . Carotid stenosis 06/2010    Right 50-69%, left <50%  . Atrial fibrillation     [employer] Accent RF DR model [id number] (serial number [id number])  . Tachy-brady syndrome     CHADS2 score 2  . Essential hypertension, benign   . Left atrial enlargement    Past Surgical History  Procedure Laterality Date  . Total knee arthroplasty      Right  . Laparoscopic cholecystectomy    . Pacemaker insertion  06/29/10    SJM implanted by Dr Johney Frame for tachy/brady syndrome    Current Outpatient Prescriptions  Medication Sig Dispense Refill  . amLODipine (NORVASC) 5 MG tablet TAKE 1 TABLET BY MOUTH EVERY EVENING.  30 tablet  6  . benazepril (LOTENSIN) 20 MG tablet Take 20 mg by mouth daily.      . carvedilol (COREG) 12.5 MG tablet Take 12.5 mg by mouth 2 (two) times daily with a meal.        . Cholecalciferol (VITAMIN D3) 1000 UNITS tablet Take 1,000 Units by mouth daily.        . hydrochlorothiazide 25 MG tablet TAKE ONE TABLET BY MOUTH BY MOUTH DAILY. - EMERGENCY REFILL FAXED DR  30 tablet  1  . isosorbide mononitrate (IMDUR) 30 MG 24 hr tablet Take 1 tablet (30 mg total) by mouth daily.  30 tablet  6  . nitroGLYCERIN (NITROSTAT) 0.4 MG SL tablet Place 0.4 mg under the tongue every 5 (five) minutes as needed.      . warfarin (COUMADIN) 2.5 MG tablet Take 1 tablet daily  except 2 tablets on Tuesdays, Thursdays and Saturdays Managed by Misty Stanley.       No current facility-administered medications for this visit.    Allergies  Allergen Reactions  . Penicillins     History   Social History  . Marital Status: Married    Spouse Name: Greggory Stallion    Number of Children: N/A  . Years of Education: N/A   Occupational History  . RETIRED    Social History Main Topics  . Smoking status: Never Smoker   . Smokeless tobacco: Never Used  . Alcohol Use: No  . Drug Use: Not on file  . Sexually Active: Not on file   Other Topics Concern  . Not on file   Social History Narrative  . No narrative on file     Physical Exam: Filed Vitals:   12/02/12 1015  BP: 95/62  Pulse: 66  Height: 4\' 11"  (1.499 m)  Weight: 139 lb (63.05 kg)    GEN- The patient is elderly appearing, alert and oriented x 3 today.   Head- normocephalic, atraumatic Eyes-  Sclera clear, conjunctiva pink Ears- hearing intact Oropharynx- clear Neck- supple, no JVP Lymph- no cervical lymphadenopathy Lungs- Clear to ausculation bilaterally, normal work of breathing Chest- pacemaker pocket is well healed Heart- Regular rate and rhythm, no murmurs, rubs or  gallops, PMI not laterally displaced GI- soft, NT, ND, + BS Extremities- no clubbing, cyanosis, or edema  Pacemaker interrogation- reviewed in detail today,  See PACEART report  Assessment and Plan:

## 2012-12-02 NOTE — Assessment & Plan Note (Signed)
Well controlled Continue coumadin She denies bleeding

## 2012-12-02 NOTE — Patient Instructions (Signed)
   Stop Amlodipine (Norvasc) Continue all other current medications. Allred - 1 year

## 2012-12-11 ENCOUNTER — Ambulatory Visit (INDEPENDENT_AMBULATORY_CARE_PROVIDER_SITE_OTHER): Payer: Medicare Other | Admitting: *Deleted

## 2012-12-14 ENCOUNTER — Other Ambulatory Visit: Payer: Self-pay | Admitting: Cardiology

## 2013-01-14 ENCOUNTER — Other Ambulatory Visit: Payer: Self-pay | Admitting: Cardiology

## 2013-01-14 MED ORDER — WARFARIN SODIUM 2.5 MG PO TABS: ORAL_TABLET | ORAL | Status: DC

## 2013-01-15 ENCOUNTER — Ambulatory Visit (INDEPENDENT_AMBULATORY_CARE_PROVIDER_SITE_OTHER): Payer: Medicare Other | Admitting: *Deleted

## 2013-01-15 DIAGNOSIS — I4891 Unspecified atrial fibrillation: Secondary | ICD-10-CM

## 2013-01-15 DIAGNOSIS — Z7901 Long term (current) use of anticoagulants: Secondary | ICD-10-CM

## 2013-01-17 ENCOUNTER — Encounter: Payer: Self-pay | Admitting: Cardiology

## 2013-02-26 ENCOUNTER — Ambulatory Visit (INDEPENDENT_AMBULATORY_CARE_PROVIDER_SITE_OTHER): Payer: Medicare Other | Admitting: *Deleted

## 2013-02-26 DIAGNOSIS — I4891 Unspecified atrial fibrillation: Secondary | ICD-10-CM

## 2013-02-26 DIAGNOSIS — Z7901 Long term (current) use of anticoagulants: Secondary | ICD-10-CM

## 2013-02-26 LAB — POCT INR: INR: 2.1

## 2013-03-01 ENCOUNTER — Ambulatory Visit (INDEPENDENT_AMBULATORY_CARE_PROVIDER_SITE_OTHER): Payer: Medicare Other | Admitting: *Deleted

## 2013-03-01 DIAGNOSIS — Z950 Presence of cardiac pacemaker: Secondary | ICD-10-CM

## 2013-03-01 DIAGNOSIS — I495 Sick sinus syndrome: Secondary | ICD-10-CM

## 2013-03-01 LAB — REMOTE PACEMAKER DEVICE
AL AMPLITUDE: 0.2 mv
AL IMPEDENCE PM: 410 Ohm
BAMS-0003: 70 {beats}/min
BATTERY VOLTAGE: 2.93 V
BRDY-0003RA: 120 {beats}/min
RV LEAD AMPLITUDE: 8.2 mv
VENTRICULAR PACING PM: 1

## 2013-03-19 ENCOUNTER — Encounter: Payer: Self-pay | Admitting: *Deleted

## 2013-03-31 ENCOUNTER — Encounter: Payer: Self-pay | Admitting: Internal Medicine

## 2013-05-21 ENCOUNTER — Other Ambulatory Visit: Payer: Self-pay | Admitting: *Deleted

## 2013-05-21 MED ORDER — WARFARIN SODIUM 2.5 MG PO TABS: ORAL_TABLET | ORAL | Status: DC

## 2013-05-31 ENCOUNTER — Ambulatory Visit (INDEPENDENT_AMBULATORY_CARE_PROVIDER_SITE_OTHER): Payer: Medicare Other | Admitting: *Deleted

## 2013-05-31 ENCOUNTER — Encounter: Payer: Self-pay | Admitting: Internal Medicine

## 2013-05-31 DIAGNOSIS — I495 Sick sinus syndrome: Secondary | ICD-10-CM

## 2013-05-31 DIAGNOSIS — Z950 Presence of cardiac pacemaker: Secondary | ICD-10-CM

## 2013-05-31 LAB — REMOTE PACEMAKER DEVICE
AL AMPLITUDE: 0.5 mv
AL IMPEDENCE PM: 400 Ohm
AL THRESHOLD: 0.625 v
ATRIAL PACING PM: 96
BAMS-0001: 150 {beats}/min
BAMS-0003: 70 {beats}/min
BATTERY VOLTAGE: 2.93 v
BRDY-0002RA: 60 {beats}/min
BRDY-0003RA: 120 {beats}/min
BRDY-0004RA: 120 {beats}/min
DEVICE MODEL PM: 7165695
RV LEAD AMPLITUDE: 0.7 mv
RV LEAD IMPEDENCE PM: 640 Ohm
VENTRICULAR PACING PM: 1

## 2013-06-15 ENCOUNTER — Encounter: Payer: Self-pay | Admitting: *Deleted

## 2013-07-20 ENCOUNTER — Ambulatory Visit (INDEPENDENT_AMBULATORY_CARE_PROVIDER_SITE_OTHER): Payer: Medicaid Other | Admitting: Cardiology

## 2013-07-20 ENCOUNTER — Encounter: Payer: Self-pay | Admitting: Cardiology

## 2013-07-20 VITALS — BP 156/78 | HR 67 | Ht <= 58 in | Wt 141.0 lb

## 2013-07-20 DIAGNOSIS — I6529 Occlusion and stenosis of unspecified carotid artery: Secondary | ICD-10-CM

## 2013-07-20 DIAGNOSIS — I10 Essential (primary) hypertension: Secondary | ICD-10-CM

## 2013-07-20 DIAGNOSIS — Z950 Presence of cardiac pacemaker: Secondary | ICD-10-CM

## 2013-07-20 DIAGNOSIS — I4891 Unspecified atrial fibrillation: Secondary | ICD-10-CM

## 2013-07-20 NOTE — Assessment & Plan Note (Signed)
Blood pressure is elevated today. Patient states she's had some salty meals recently. Reports compliance with her medications otherwise. I recommended that she follow up with Dr. Sherryll Burger.

## 2013-07-20 NOTE — Progress Notes (Signed)
Clinical Summary [name] is an 79 y.o.female last in December 2013. Followup continues with Dr. Johney Frame in the device clinic. ECG today shows sinus rhythm with indistinct P waves. She reports no major problems with progressive palpitations, no bleeding problems on Coumadin. Weight is relatively stable, up by only 2 pounds from February.  She continues to follow regularly with Dr. Sherryll Burger.   Allergies  Allergen Reactions  . Penicillins     Current Outpatient Prescriptions  Medication Sig Dispense Refill  . benazepril (LOTENSIN) 20 MG tablet Take 20 mg by mouth daily.      . carvedilol (COREG) 12.5 MG tablet Take 12.5 mg by mouth 2 (two) times daily with a meal.        . Cholecalciferol (VITAMIN D3) 1000 UNITS tablet Take 1,000 Units by mouth daily.        . clotrimazole-betamethasone (LOTRISONE) cream Apply 1 application topically 2 (two) times daily.       . furosemide (LASIX) 20 MG tablet Take 20 mg by mouth daily.      . isosorbide mononitrate (IMDUR) 30 MG 24 hr tablet Take 1 tablet (30 mg total) by mouth daily.  30 tablet  6  . nitroGLYCERIN (NITROSTAT) 0.4 MG SL tablet Place 0.4 mg under the tongue every 5 (five) minutes as needed.      Marland Kitchen omeprazole (PRILOSEC) 20 MG capsule Take 20 mg by mouth daily.      Marland Kitchen warfarin (COUMADIN) 2.5 MG tablet Take 1 tablet Sunday, Monday, Wednesday, and Friday. Take 2 tablets on Tuesday Thursday and Saturday  45 tablet  3   No current facility-administered medications for this visit.    Past Medical History  Diagnosis Date  . GERD (gastroesophageal reflux disease)   . Carotid stenosis 06/2010    Right 50-69%, left <50%  . Atrial fibrillation     [employer]. Jude Medical Accent RF DR model [id number] (serial number [id number])  . Tachy-brady syndrome     CHADS2 score 2  . Essential hypertension, benign   . Left atrial enlargement     Past Surgical History  Procedure Laterality Date  . Total knee arthroplasty      Right  . Laparoscopic  cholecystectomy    . Pacemaker insertion  06/29/10    SJM implanted by Dr Johney Frame for tachy/brady syndrome    Social History [name] reports that she has never smoked. She has never used smokeless tobacco. [name] reports that she does not drink alcohol.  Review of Systems Loose stools at times. No fevers or chills. No abdominal pain, otherwise negative.  Physical Examination Filed Vitals:   07/20/13 1307  BP: 156/78  Pulse: 67   Filed Weights   07/20/13 1307  Weight: 141 lb (63.957 kg)    No acute distress.  HEENT: Conjunctiva and lids normal, oropharynx clear.  Neck: Supple, no elevated JVP or carotid bruits, no thyromegaly.  Lungs: Clear to auscultation, nonlabored breathing at rest.  Cardiac: Regular, no S3, soft systolic murmur, no pericardial rub.  Abdomen: Soft, nontender, bowel sounds present, no guarding or rebound.  Extremities: No pitting edema, distal pulses 2+.    Problem List and Plan   FIBRILLATION, ATRIAL Continue current management, on Coreg and Coumadin.  PACEMAKER-St.Jude Keep followup with Dr. Johney Frame.  Essential hypertension, benign Blood pressure is elevated today. Patient states she's had some salty meals recently. Reports compliance with her medications otherwise. I recommended that she follow up with Dr. Sherryll Burger.  CAROTID ARTERY STENOSIS Has  been followed by Dr. Sherryll Burger. Dopplers from September 2011 showed less than 50% LICA stenosis and 50-69% RICA stenosis.    Jonelle Sidle, M.D., F.A.C.C.

## 2013-07-20 NOTE — Patient Instructions (Addendum)

## 2013-07-20 NOTE — Assessment & Plan Note (Signed)
Has been followed by Dr. Sherryll Burger. Dopplers from September 2011 showed less than 50% LICA stenosis and 50-69% RICA stenosis.

## 2013-07-20 NOTE — Assessment & Plan Note (Signed)
Keep followup with Dr. Allred. 

## 2013-07-20 NOTE — Assessment & Plan Note (Signed)
Continue current management, on Coreg and Coumadin.

## 2013-09-03 ENCOUNTER — Other Ambulatory Visit: Payer: Self-pay | Admitting: Cardiology

## 2013-09-06 ENCOUNTER — Other Ambulatory Visit: Payer: Self-pay | Admitting: Internal Medicine

## 2013-09-06 ENCOUNTER — Encounter: Payer: Self-pay | Admitting: Internal Medicine

## 2013-09-06 ENCOUNTER — Ambulatory Visit (INDEPENDENT_AMBULATORY_CARE_PROVIDER_SITE_OTHER): Payer: Medicare Other | Admitting: *Deleted

## 2013-09-06 DIAGNOSIS — I4891 Unspecified atrial fibrillation: Secondary | ICD-10-CM

## 2013-09-06 DIAGNOSIS — I495 Sick sinus syndrome: Secondary | ICD-10-CM

## 2013-09-10 LAB — MDC_IDC_ENUM_SESS_TYPE_REMOTE
Battery Voltage: 2.95 V
Brady Statistic AP VP Percent: 1 %
Brady Statistic AP VS Percent: 96 %
Brady Statistic RA Percent Paced: 96 %
Date Time Interrogation Session: 20141124070239
Implantable Pulse Generator Serial Number: 7165695
Lead Channel Impedance Value: 360 Ohm
Lead Channel Pacing Threshold Amplitude: 0.5 V
Lead Channel Pacing Threshold Amplitude: 0.875 V
Lead Channel Pacing Threshold Pulse Width: 0.4 ms
Lead Channel Sensing Intrinsic Amplitude: 1.2 mV
Lead Channel Setting Pacing Amplitude: 1.875
Lead Channel Setting Pacing Amplitude: 2.5 V
Lead Channel Setting Pacing Pulse Width: 0.4 ms

## 2013-09-15 ENCOUNTER — Encounter: Payer: Self-pay | Admitting: *Deleted

## 2013-09-24 ENCOUNTER — Encounter: Payer: Self-pay | Admitting: Internal Medicine

## 2013-09-24 ENCOUNTER — Encounter (HOSPITAL_COMMUNITY): Payer: Self-pay | Admitting: Nurse Practitioner

## 2013-09-24 ENCOUNTER — Inpatient Hospital Stay (HOSPITAL_COMMUNITY)
Admission: AD | Admit: 2013-09-24 | Discharge: 2013-10-01 | DRG: 308 | Disposition: A | Discharge status: Skilled Nursing Facility | Payer: Medicare Other | Source: Other Acute Inpatient Hospital | Attending: Internal Medicine | Admitting: Internal Medicine

## 2013-09-24 DIAGNOSIS — Z23 Encounter for immunization: Secondary | ICD-10-CM

## 2013-09-24 DIAGNOSIS — Z79899 Other long term (current) drug therapy: Secondary | ICD-10-CM

## 2013-09-24 DIAGNOSIS — N189 Chronic kidney disease, unspecified: Secondary | ICD-10-CM | POA: Diagnosis present

## 2013-09-24 DIAGNOSIS — K219 Gastro-esophageal reflux disease without esophagitis: Secondary | ICD-10-CM | POA: Diagnosis present

## 2013-09-24 DIAGNOSIS — Z950 Presence of cardiac pacemaker: Secondary | ICD-10-CM

## 2013-09-24 DIAGNOSIS — R4789 Other speech disturbances: Secondary | ICD-10-CM | POA: Diagnosis present

## 2013-09-24 DIAGNOSIS — R0600 Dyspnea, unspecified: Secondary | ICD-10-CM

## 2013-09-24 DIAGNOSIS — D649 Anemia, unspecified: Secondary | ICD-10-CM

## 2013-09-24 DIAGNOSIS — I4891 Unspecified atrial fibrillation: Principal | ICD-10-CM

## 2013-09-24 DIAGNOSIS — Z96659 Presence of unspecified artificial knee joint: Secondary | ICD-10-CM

## 2013-09-24 DIAGNOSIS — R1313 Dysphagia, pharyngeal phase: Secondary | ICD-10-CM | POA: Diagnosis present

## 2013-09-24 DIAGNOSIS — I10 Essential (primary) hypertension: Secondary | ICD-10-CM

## 2013-09-24 DIAGNOSIS — R0609 Other forms of dyspnea: Secondary | ICD-10-CM

## 2013-09-24 DIAGNOSIS — E039 Hypothyroidism, unspecified: Secondary | ICD-10-CM

## 2013-09-24 DIAGNOSIS — I509 Heart failure, unspecified: Secondary | ICD-10-CM | POA: Diagnosis present

## 2013-09-24 DIAGNOSIS — Z7901 Long term (current) use of anticoagulants: Secondary | ICD-10-CM

## 2013-09-24 DIAGNOSIS — N184 Chronic kidney disease, stage 4 (severe): Secondary | ICD-10-CM

## 2013-09-24 DIAGNOSIS — N179 Acute kidney failure, unspecified: Secondary | ICD-10-CM | POA: Diagnosis present

## 2013-09-24 DIAGNOSIS — I6529 Occlusion and stenosis of unspecified carotid artery: Secondary | ICD-10-CM | POA: Diagnosis present

## 2013-09-24 DIAGNOSIS — I129 Hypertensive chronic kidney disease with stage 1 through stage 4 chronic kidney disease, or unspecified chronic kidney disease: Secondary | ICD-10-CM | POA: Diagnosis present

## 2013-09-24 DIAGNOSIS — D72829 Elevated white blood cell count, unspecified: Secondary | ICD-10-CM | POA: Diagnosis present

## 2013-09-24 DIAGNOSIS — J690 Pneumonitis due to inhalation of food and vomit: Secondary | ICD-10-CM | POA: Diagnosis present

## 2013-09-24 DIAGNOSIS — I5033 Acute on chronic diastolic (congestive) heart failure: Secondary | ICD-10-CM | POA: Diagnosis present

## 2013-09-24 DIAGNOSIS — R0989 Other specified symptoms and signs involving the circulatory and respiratory systems: Secondary | ICD-10-CM | POA: Diagnosis present

## 2013-09-24 DIAGNOSIS — R197 Diarrhea, unspecified: Secondary | ICD-10-CM | POA: Diagnosis present

## 2013-09-24 DIAGNOSIS — S301XXA Contusion of abdominal wall, initial encounter: Secondary | ICD-10-CM | POA: Diagnosis present

## 2013-09-24 DIAGNOSIS — R1319 Other dysphagia: Secondary | ICD-10-CM | POA: Diagnosis present

## 2013-09-24 DIAGNOSIS — R630 Anorexia: Secondary | ICD-10-CM | POA: Diagnosis present

## 2013-09-24 DIAGNOSIS — F05 Delirium due to known physiological condition: Secondary | ICD-10-CM | POA: Diagnosis present

## 2013-09-24 DIAGNOSIS — I495 Sick sinus syndrome: Secondary | ICD-10-CM | POA: Diagnosis present

## 2013-09-24 HISTORY — DX: Hypothyroidism, unspecified: E03.9

## 2013-09-24 LAB — BASIC METABOLIC PANEL
Calcium: 8.5 mg/dL (ref 8.4–10.5)
Creatinine, Ser: 2 mg/dL — ABNORMAL HIGH (ref 0.50–1.10)
GFR calc non Af Amer: 21 mL/min — ABNORMAL LOW (ref >90)
Glucose, Bld: 119 mg/dL — ABNORMAL HIGH (ref 70–99)
Potassium: 4.7 mEq/L (ref 3.5–5.1)
Sodium: 136 mEq/L (ref 135–145)

## 2013-09-24 LAB — CBC
Hemoglobin: 7.7 g/dL — ABNORMAL LOW (ref 12.0–15.0)
MCH: 28 pg (ref 26.0–34.0)
MCHC: 30.8 g/dL (ref 30.0–36.0)
RDW: 16.7 % — ABNORMAL HIGH (ref 11.5–15.5)
WBC: 6.6 10*3/uL (ref 4.0–10.5)

## 2013-09-24 LAB — GLUCOSE, CAPILLARY: Glucose-Capillary: 124 mg/dL — ABNORMAL HIGH (ref 70–99)

## 2013-09-24 LAB — OCCULT BLOOD X 1 CARD TO LAB, STOOL: Fecal Occult Bld: NEGATIVE

## 2013-09-24 MED ORDER — DILTIAZEM HCL 100 MG IV SOLR
5.0000 mg/h | INTRAVENOUS | Status: DC
  Filled 2013-09-24: qty 100

## 2013-09-24 MED ORDER — ACETAMINOPHEN 650 MG RE SUPP: 650.0000 mg | Freq: Four times a day (QID) | RECTAL | Status: DC | PRN

## 2013-09-24 MED ORDER — METOPROLOL TARTRATE 25 MG PO TABS
25.0000 mg | ORAL_TABLET | Freq: Two times a day (BID) | ORAL | Status: DC
  Administered 2013-09-24: 23:00:00 25 mg via ORAL
  Filled 2013-09-24 (×4): qty 1

## 2013-09-24 MED ORDER — SODIUM CHLORIDE 0.9 % IJ SOLN
3.0000 mL | Freq: Two times a day (BID) | INTRAMUSCULAR | Status: DC
  Administered 2013-09-24 – 2013-10-01 (×11): 3 mL via INTRAVENOUS

## 2013-09-24 MED ORDER — SODIUM CHLORIDE 0.9 % IV SOLN: 250.0000 mL | INTRAVENOUS | Status: DC | PRN

## 2013-09-24 MED ORDER — ONDANSETRON HCL 4 MG/2ML IJ SOLN: 4.0000 mg | Freq: Four times a day (QID) | INTRAMUSCULAR | Status: DC | PRN

## 2013-09-24 MED ORDER — DILTIAZEM LOAD VIA INFUSION
15.0000 mg | Freq: Once | INTRAVENOUS | Status: DC
  Filled 2013-09-24: qty 15

## 2013-09-24 MED ORDER — ACETAMINOPHEN 325 MG PO TABS
650.0000 mg | ORAL_TABLET | Freq: Four times a day (QID) | ORAL | Status: DC | PRN
  Administered 2013-09-25 – 2013-09-29 (×2): 650 mg via ORAL
  Filled 2013-09-24 (×2): qty 2

## 2013-09-24 MED ORDER — SODIUM CHLORIDE 0.9 % IJ SOLN
3.0000 mL | Freq: Two times a day (BID) | INTRAMUSCULAR | Status: DC
  Administered 2013-09-25: 3 mL via INTRAVENOUS

## 2013-09-24 MED ORDER — DILTIAZEM LOAD VIA INFUSION
15.0000 mg | Freq: Once | INTRAVENOUS | Status: DC | PRN
  Filled 2013-09-24: qty 15

## 2013-09-24 MED ORDER — ONDANSETRON HCL 4 MG PO TABS: 4.0000 mg | ORAL_TABLET | Freq: Four times a day (QID) | ORAL | Status: DC | PRN

## 2013-09-24 MED ORDER — SODIUM CHLORIDE 0.9 % IJ SOLN: 3.0000 mL | INTRAMUSCULAR | Status: DC | PRN

## 2013-09-24 MED ORDER — FUROSEMIDE 10 MG/ML IJ SOLN
40.0000 mg | Freq: Two times a day (BID) | INTRAMUSCULAR | Status: DC
  Administered 2013-09-25: 09:00:00 40 mg via INTRAVENOUS
  Filled 2013-09-24 (×3): qty 4

## 2013-09-24 MED ORDER — PANTOPRAZOLE SODIUM 40 MG PO TBEC
40.0000 mg | DELAYED_RELEASE_TABLET | Freq: Every day | ORAL | Status: DC
  Administered 2013-09-24 – 2013-09-25 (×2): 40 mg via ORAL
  Filled 2013-09-24 (×2): qty 1

## 2013-09-24 MED ORDER — CARVEDILOL 12.5 MG PO TABS
12.5000 mg | ORAL_TABLET | Freq: Two times a day (BID) | ORAL | Status: DC
  Filled 2013-09-24 (×2): qty 1

## 2013-09-24 NOTE — Consult Note (Signed)
CARDIOLOGY CONSULT NOTE   Patient ID: [name] MRN: [id number], DOB/AGE: 05/15/1924   Admit date: 09/24/2013 Date of Consult: 09/24/2013  Primary Physician: Kirstie Peri, MD Primary Cardiologist: Ival Bible, MD  Pt. Profile  79 y/o female with h/o PAF and tachybrady syndrome s/p PPM and on chronic coumadin, who presents on tx from [facility] 2/2 afib rvr.  Problem List  Past Medical History  Diagnosis Date  . GERD (gastroesophageal reflux disease)   . Carotid stenosis     a. 92011 Carotid u/s: Right 50-69%, left <50%  . Atrial fibrillation     a. chronic coumadin;  b. 07/2011 Echo: EF 65%, triv MR, mildly dil LA.  . Tachy-brady syndrome     a. 06/2010: SJM Accent RF DR model [id number] (serial number [id number])  . Essential hypertension, benign   . Hypothyroidism     a. Dx 09/2013    Past Surgical History  Procedure Laterality Date  . Total knee arthroplasty      Right  . Laparoscopic cholecystectomy    . Pacemaker insertion  06/29/10    SJM implanted by Dr Johney Frame for tachy/brady syndrome     Allergies  Allergies  Allergen Reactions  . Penicillins Rash    HPI   79 y/o female with h/o PAF on chronic coumadin anticoagulation.  She is also s/p PPM placement 2/2 tachy-brady syndrome.  She was last seen in clinic in October and at that time was in sinus rhythm and was doing well.  Unfortunately, beginning about 3 weeks ago, she began to experience progressive DOE and wkns, associated with wheezing.  In that setting, she also developed lower extremity edema, pnd, and early satiety.  Her dtr noted that she was quite dyspneic over this past weekend and used a home pulse oximeter which apparently showed adequate oxygenation but an irregular heart beat in the 1-teens to 120's.  Because of progression of Ss, and now an irregular heart rhythm, her dtr took her to see her PCP on Wednesday.  There, she was noted to be tachycardic and dyspneic.  Per family, she was treated  with a nebulizer and felt somewhat better.  She was scheduled to f/u again the next day.  Unfortunately, on the evening of 12/10, dyspnea worsened and her family took her to the [facility] ED.  There, she was found to be in afib with rvr with evidence of volume overload on exam.  Pro-BNP was moderately elevated.  INR was 4.2 (last known INR prior to this week was <2.0 on 11/6, per husband).  She was admitted and placed on IV lasix.  Cardiology was consulted and recommended ongoing diuresis and IV dilt as BP tolerated, however her BP remained soft and this was never able to be started.  Digoxin was considered but deferred 2/2 creat of 2.0.  Hgb was also found to be 8.2 on admission.  This dropped to 7.5 today and pt was to be transfused with 1 unit of prbc's.  Due to antibody incompatibilities however, she was unable to be transfused and [facility], and required transfer to [facility].  Currently, she remains in afib with rates in the 120's to 130's.  She denies palpitations and at rest is in no acute distress.  She currently denies chest pain or dyspnea.  Inpatient Medications  . diltiazem  15 mg Intravenous Once  . furosemide  40 mg Intravenous BID  . metoprolol tartrate  25 mg Oral BID  . pantoprazole  40 mg Oral Daily  .  sodium chloride  3 mL Intravenous Q12H  . sodium chloride  3 mL Intravenous Q12H    Family History Family History  Problem Relation Age of Onset  . Other      no premature CAD.     Social History History   Social History  . Marital Status: Married    Spouse Name: Greggory Stallion    Number of Children: N/A  . Years of Education: N/A   Occupational History  . RETIRED    Social History Main Topics  . Smoking status: Never Smoker   . Smokeless tobacco: Never Used  . Alcohol Use: No  . Drug Use: Not on file  . Sexual Activity: Not on file   Other Topics Concern  . Not on file   Social History Narrative   Lives in [place] with her husband.     Review of Systems  General:  No chills,  fever, night sweats or weight changes.  Cardiovascular:  No chest pain, +++ dyspnea on exertion, +++ edema, +++ orthopnea, no palpitations, +++ paroxysmal nocturnal dyspnea. Dermatological: No rash, lesions/masses Respiratory: No cough, +++ dyspnea Urologic: No hematuria, dysuria Abdominal:   No nausea, vomiting, diarrhea, bright red blood per rectum, melena, or hematemesis Neurologic:  No visual changes, +++ wkns, changes in mental status. All other systems reviewed and are otherwise negative except as noted above.  Physical Exam  Blood pressure 97/41, pulse 142, temperature 98.9 F (37.2 C), temperature source Oral, resp. rate 16, height 4\' 10"  (1.473 m), weight 149 lb 4 oz (67.7 kg), SpO2 100.00%.  General: Pleasant, NAD Psych: Normal affect. Neuro: Alert and oriented X 3. Moves all extremities spontaneously. HEENT: Normal  Neck: Supple without bruits. Lungs:  Resp regular and unlabored, bibasilar crackles, otw diminished throughout.  Exp wheezing noted anterioraly. Heart: IR, IR, tachy, no s3, s4, or murmurs. Abdomen: Soft, non-tender, non-distended, BS + x 4.  Bruise / echymosis on right flank Extremities: No clubbing, cyanosis.  Trace bilat LE edema. DP/PT/Radials 2+ and equal bilaterally.  Labs  Na 136, K 4.6, Cl 103, CO2 25, BUN 48, Cr 2.06, Gluc 118 T bili 0.5, Alk Phos 64, AST 17, ALT 9 TP 6.7 Alb 3.3 TSH 17.78 INR 4.2 WBC 5.9, H/H 8.2/25.7, Plt 69  Radiology/Studies  CXR - bilat pl effusions per H/P @ [facility].  ASSESSMENT AND PLAN  See below.  Signed, Nicolasa Ducking, NP 09/24/2013, 5:20 PM  Attending Note:   The patient was seen and examined.  Agree with assessment and plan as noted above.  Changes made to the above note as needed.  1. Diarrhea:   Ms. Herbold has had a steady and gradual decline for the past several months.  She has had frequent episodes of diarrhea and incontenence of stool.  She has been too weak to even stand up and as a result, she has not  been able to fix and  meals for herself and husband for the past 2 months.  As a result, they have been eating take out for every meal.  These take out dinners have largely not included any greens - which is why I think her INR is now supratheraputic.      I think that her main issue is likely GI / abdominal.  She started feeling poorly several months ago and became too weak to even stand to fix meals.  She may need CT,  She has a flank bruise.  It make be useful to get a non contrast CT of  the abdomen to evaluate this.  We would want to rule out an adrenal hemorrhage ( I suspect she would be much more hemodynamically unstable if she had adrenal failure due to adrenal hemorrhage)   2. Atrial fib:  We had her pacer interrogated this evening and she has been in A-Fib since at least Dec. 6 ( the pacer last ran an atrial capture test Dec. 6 and would typically run this every 8 hours if she were in sinus rhythm)    I think her primary issue started several months ago and is GI related.  Her initial symptoms were diarrhea and generalized weakness with standing.  Her daughter sees her often and first noted the fast , irregular HR  This week.  I do not think she has had months of A-fib that have lead to her deteriation.   Her rate is still elevated but her BP is marginal.  Will change coreg to metoprolol. I do that think that urgent cardioversion would help her.  I think the A-fib is likely secondary to her other issues.  In addition, I doubt that she would maintain NSR.   3.  Anemia: Hb is now 7.7 She has become progressively anemic.  She has a large bruise / ecchymosis on her right flank.  It's possible that she has bleed into this region.  I suspect she has had  a more gradual drop in her Hb - checking stools.    4. Hypothyroidism:  TSH is 17.  Was started on synthroid yesterday  5. Chronic renal failure:    Her GFR is 21.    Plans per Int. Med.   I spent quite a bit of time with patient, husband and  then even longer with daughter and son-in-law.  She has several moderately serious problems and I think her overall health is actually fairly poor.    Vesta Mixer, Montez Hageman., MD, [facility] 09/24/2013, 5:51 PM

## 2013-09-24 NOTE — Progress Notes (Signed)
Utilization Review Completed Madge Therrien J. Una Yeomans, RN, BSN, NCM [phone]  

## 2013-09-24 NOTE — Progress Notes (Signed)
BP 97/41 order to start cardizem drip call to MD to make aware of BP. Agree to hold drip. Cardiology consult ordered. Drip not started at this time

## 2013-09-24 NOTE — H&P (Signed)
[facility] History and Physical  HENNESSEY CANTRELL WUJ:[id number] DOB: Feb 01, 1924 DOA: 09/24/2013  Referring physician: none PCP: Kirstie Peri, MD   Chief Complaint: A fib with RVR  HPI: [name] is a 79 y.o. female  Past medical history of atrial fibrillation and tachybradycardia syndrome status post pacemaker, that went to [facility] because of dyspnea and generalized weakness which has progressively gotten worse. She's she was found to be in atrial fibrillation with RVR was started on diltiazem drip and held no blood pressures in the 90s, also hemoglobin was checked which was 7.5 (previously 10), due to not having cardiology over the weekend and as per [facility]. She relates she continues to be weak and mildly short of breath.also some shortness of breath with minimal exertion.    Review of Systems:  Constitutional:  No weight loss, night sweats, Fevers, chills, fatigue.  HEENT:  No headaches, Difficulty swallowing,Tooth/dental problems,Sore throat,  No sneezing, itching, ear ache, nasal congestion, post nasal drip,  Cardio-vascular:  No chest pain, Orthopnea, PND, swelling in lower extremities, anasarca, dizziness, palpitations  GI:  No heartburn, indigestion, abdominal pain, nausea, vomiting, diarrhea, change in bowel habits, loss of appetite  Resp:   No excess mucus, no productive cough, No non-productive cough, No coughing up of blood.No change in color of mucus.No wheezing.No chest wall deformity  Skin:  no rash or lesions.  GU:  no dysuria, change in color of urine, no urgency or frequency. No flank pain.  Musculoskeletal:  No joint pain or swelling. No decreased range of motion. No back pain.  Psych:  No change in mood or affect. No depression or anxiety. No memory loss.   Past Medical History  Diagnosis Date  . GERD (gastroesophageal reflux disease)   . Carotid stenosis 06/2010    Right 50-69%, left <50%  . Atrial fibrillation    [employer] RF DR model [id number] (serial number [id number])  . Tachy-brady syndrome     CHADS2 score 2  . Essential hypertension, benign   . Left atrial enlargement    Past Surgical History  Procedure Laterality Date  . Total knee arthroplasty      Right  . Laparoscopic cholecystectomy    . Pacemaker insertion  06/29/10    SJM implanted by Dr Johney Frame for tachy/brady syndrome   Social History:  reports that she has never smoked. She has never used smokeless tobacco. She reports that she does not drink alcohol. Her drug history is not on file.  Allergies  Allergen Reactions  . Penicillins Rash    No family history on file.   Prior to Admission medications   Medication Sig Start Date End Date Taking? Authorizing Provider  benazepril (LOTENSIN) 20 MG tablet Take 20 mg by mouth daily. 08/27/11   June Leap, MD  carvedilol (COREG) 12.5 MG tablet Take 12.5 mg by mouth 2 (two) times daily with a meal.      Historical Provider, MD  Cholecalciferol (VITAMIN D3) 1000 UNITS tablet Take 1,000 Units by mouth daily.      Historical Provider, MD  clotrimazole-betamethasone (LOTRISONE) cream Apply 1 application topically 2 (two) times daily.  07/08/13   Historical Provider, MD  furosemide (LASIX) 20 MG tablet Take 20 mg by mouth daily.    Historical Provider, MD  isosorbide mononitrate (IMDUR) 30 MG 24 hr tablet Take 1 tablet (30 mg total) by mouth daily. 11/09/12   Jonelle Sidle, MD  nitroGLYCERIN (NITROSTAT) 0.4 MG  SL tablet Place 0.4 mg under the tongue every 5 (five) minutes as needed. 07/05/11   June Leap, MD  omeprazole (PRILOSEC) 20 MG capsule Take 20 mg by mouth daily.    Historical Provider, MD  warfarin (COUMADIN) 2.5 MG tablet TAKE 1 TABLET BY MOUTH ON SUNDAY, MONDAY, WEDNESDAY, AND FRIDAY. TAKE 2 TABLETS BY MOUTH TUESDAY, THURSDAY, AND SATURDAY. 09/03/13   Jonelle Sidle, MD   Physical Exam: Filed Vitals:   09/24/13 1353  BP: 109/82  Pulse: 115  Temp: 98.9 F (37.2  C)  Resp: 16    BP 109/82  Pulse 115  Temp(Src) 98.9 F (37.2 C) (Oral)  Resp 16  Ht 4\' 10"  (1.473 m)  Wt 67.7 kg (149 lb 4 oz)  BMI 31.20 kg/m2  SpO2 100%  BP 109/82  Pulse 115  Temp(Src) 98.9 F (37.2 C) (Oral)  Resp 16  Ht 4\' 10"  (1.473 m)  Wt 67.7 kg (149 lb 4 oz)  BMI 31.20 kg/m2  SpO2 100%  General Appearance:    Alert, cooperative, no distress, appears stated age, pale appearing              Throat:   Lips, mucosa, and tongue dry  Neck:   Supple, symmetrical, trachea midline, no adenopathy;    thyroid:  no  JVD     Lungs:     Clear to auscultation bilaterally, respirations unlabored      Heart:    Regular rate and rhythm, S1 and S2 normal, no murmur, rub   or gallop     Abdomen:     Soft, non-tender, bowel sounds active all four quadrants,    no masses, no organomegaly        Extremities:   Extremities normal, atraumatic, no cyanosis or edema     Skin:   Skin color, texture, turgor normal, no rashes or lesions  Lymph nodes:   Cervical, supraclavicular, and axillary nodes normal  Neurologic:   CNII-XII intact, normal strength, sensation and reflexes    throughout             Labs on Admission:  Basic Metabolic Panel: No results found for this basename: NA, K, CL, CO2, GLUCOSE, BUN, CREATININE, CALCIUM, MG, PHOS,  in the last 168 hours Liver Function Tests: No results found for this basename: AST, ALT, ALKPHOS, BILITOT, PROT, ALBUMIN,  in the last 168 hours No results found for this basename: LIPASE, AMYLASE,  in the last 168 hours No results found for this basename: AMMONIA,  in the last 168 hours CBC: No results found for this basename: WBC, NEUTROABS, HGB, HCT, MCV, PLT,  in the last 168 hours Cardiac Enzymes: No results found for this basename: CKTOTAL, CKMB, CKMBINDEX, TROPONINI,  in the last 168 hours  BNP (last 3 results) No results found for this basename: PROBNP,  in the last 8760 hours CBG: No results found for this basename: GLUCAP,   in the last 168 hours  Radiological Exams on Admission: No results found.  EKG: Independently reviewed. pending  Assessment/Plan  Atrial fibrillation with RVR/  Anemia Dyspnea: - She relates no melanotic stools, no hematemesis, At [facility] her INR was 4. I think her anemia is driving her A. Fib with RVR, decrease in her oxygen capacity due to low hemoglobin. We'll go ahead and start her on a diltiazem drip, her vital seems to be stable here. Continue Coreg. - Will ahead and type and cross for 2 units of packed red blood  cells we'll go ahead and transfuse. Continue to monitor strict I.'s and O's. She seems to be intravascularly depleted by physical exam she is complaining of being thirsty. And kept asking me for water during her interview. - Hold her Lasix dose as she got extra Lasix dose at [facility]. No blood transfusion should help replete her vascular  volume.  Essential hypertension, benign - We'll hold her blood pressure medication except for Coreg.    Code Status: full Family Communication: daughter and husband Disposition Plan: inpatient  Time spent: 80 minutes  Marinda Elk [facility] Pager [phone]

## 2013-09-24 NOTE — Progress Notes (Signed)
Dr Elease Hashimoto and PA here examined pt.here for cards consult . Orders left. Pt awaiting blood transfusionx 2. Pt has many antigens and was transferred here from [facility] for this reason plus at fib with RVR.

## 2013-09-24 NOTE — Progress Notes (Signed)
Pt Transferred here from [facility] report was called pre arrival by [facility]. VS done and placed on telemetry monitor. Pt alert/oriented. Oriented to room. Call to bed manager for admission and admitting MD for orders.

## 2013-09-25 ENCOUNTER — Inpatient Hospital Stay (HOSPITAL_COMMUNITY): Payer: Medicare Other

## 2013-09-25 DIAGNOSIS — N179 Acute kidney failure, unspecified: Secondary | ICD-10-CM | POA: Diagnosis present

## 2013-09-25 DIAGNOSIS — I059 Rheumatic mitral valve disease, unspecified: Secondary | ICD-10-CM

## 2013-09-25 LAB — COMPREHENSIVE METABOLIC PANEL
ALT: 6 U/L (ref 0–35)
AST: 13 U/L (ref 0–37)
Albumin: 3 g/dL — ABNORMAL LOW (ref 3.5–5.2)
Alkaline Phosphatase: 67 U/L (ref 39–117)
CO2: 22 mEq/L (ref 19–32)
Chloride: 104 mEq/L (ref 96–112)
Creatinine, Ser: 1.9 mg/dL — ABNORMAL HIGH (ref 0.50–1.10)
GFR calc non Af Amer: 23 mL/min — ABNORMAL LOW (ref >90)
Glucose, Bld: 108 mg/dL — ABNORMAL HIGH (ref 70–99)
Potassium: 4.9 mEq/L (ref 3.5–5.1)
Sodium: 137 mEq/L (ref 135–145)
Total Bilirubin: 0.6 mg/dL (ref 0.3–1.2)

## 2013-09-25 LAB — CBC
Hemoglobin: 11.1 g/dL — ABNORMAL LOW (ref 12.0–15.0)
MCV: 90.5 fL (ref 78.0–100.0)
Platelets: 89 10*3/uL — ABNORMAL LOW (ref 150–400)
RBC: 3.88 MIL/uL (ref 3.87–5.11)
WBC: 9.6 10*3/uL (ref 4.0–10.5)

## 2013-09-25 MED ORDER — PNEUMOCOCCAL VAC POLYVALENT 25 MCG/0.5ML IJ INJ
0.5000 mL | INJECTION | INTRAMUSCULAR | Status: DC
  Filled 2013-09-25: qty 0.5

## 2013-09-25 MED ORDER — LORAZEPAM 2 MG/ML IJ SOLN
0.5000 mg | Freq: Once | INTRAMUSCULAR | Status: AC
  Administered 2013-09-25: 22:00:00 0.5 mg via INTRAVENOUS
  Filled 2013-09-25 (×2): qty 1

## 2013-09-25 MED ORDER — METOPROLOL TARTRATE 50 MG PO TABS
50.0000 mg | ORAL_TABLET | Freq: Two times a day (BID) | ORAL | Status: DC
  Administered 2013-09-25 – 2013-09-26 (×3): 50 mg via ORAL
  Filled 2013-09-25 (×6): qty 1

## 2013-09-25 MED ORDER — TRAMADOL HCL 50 MG PO TABS
50.0000 mg | ORAL_TABLET | Freq: Once | ORAL | Status: AC
  Administered 2013-09-26: 04:00:00 50 mg via ORAL
  Filled 2013-09-25: qty 1

## 2013-09-25 NOTE — Evaluation (Signed)
Physical Therapy Evaluation Patient Details Name: [name] MRN: [id number] DOB: March 19, 1924 Today's Date: 09/25/2013 Time: 7829-5621 PT Time Calculation (min): 34 min  PT Assessment / Plan / Recommendation History of Present Illness  patient admitted with Afib, diarrhea and anemia.  Workup still in progress.  Clinical Impression  Patient did well with mobility - requiring min assist for transfers and gait and increased assistance for bed mobility.  Patient is very limited by cardiopulmonary status at this time.  Unsure of family support for patient.  She will need 24 hour assistance at discharge and thus may need SNF.  Patient will benefit from PT to increase independence with mobility.    PT Assessment  Patient needs continued PT services    Follow Up Recommendations  Supervision/Assistance - 24 hour;Other (comment) (may need SNF depending on progress and family support)    Does the patient have the potential to tolerate intense rehabilitation      Barriers to Discharge        Equipment Recommendations  Wheelchair (measurements PT)    Recommendations for Other Services     Frequency Min 3X/week    Precautions / Restrictions     Pertinent Vitals/Pain Patient denies pain      Mobility  Bed Mobility Bed Mobility: Sit to Supine;Scooting to HOB Sit to Supine: 2: Max assist Scooting to HOB: 1: +2 Total assist Scooting to [facility]: Patient Percentage: 20% Details for Bed Mobility Assistance: patient with difficulty positioning self in bed, required increased assistance Transfers Transfers: Sit to Stand;Stand to Sit Sit to Stand: 4: Min assist;From chair/3-in-1;With upper extremity assist Stand to Sit: 4: Min assist;To chair/3-in-1;To bed;With upper extremity assist Ambulation/Gait Ambulation/Gait Assistance: 4: Min assist Ambulation Distance (Feet): 10 Feet Assistive device: Rolling walker Gait Pattern: Step-to pattern    Exercises     PT Diagnosis: Difficulty  walking;Generalized weakness  PT Problem List: Decreased strength;Decreased activity tolerance;Decreased balance;Decreased mobility;Decreased knowledge of use of DME;Cardiopulmonary status limiting activity PT Treatment Interventions: DME instruction;Gait training;Functional mobility training;Therapeutic activities;Therapeutic exercise;Balance training;Patient/family education     PT Goals(Current goals can be found in the care plan section) Acute Rehab PT Goals Patient Stated Goal: get back home PT Goal Formulation: With patient Time For Goal Achievement: 10/09/13 Potential to Achieve Goals: Good  Visit Information  Last PT Received On: 09/25/13 Assistance Needed: +1 History of Present Illness: patient admitted with Afib, diarrhea and anemia.  Workup still in progress.       Prior Functioning  Home Living Family/patient expects to be discharged to:: Private residence Living Arrangements: Spouse/significant other Available Help at Discharge: Family Type of Home: Apartment Home Access: Stairs to enter Secretary/administrator of Steps: 1 Home Layout: One level Home Equipment: Environmental consultant - 4 wheels;Bedside commode Prior Function Level of Independence: Needs assistance Gait / Transfers Assistance Needed: ambulated with rollator ADL's / Homemaking Assistance Needed: husband and her share homemaking activities Communication Communication: No difficulties    Cognition  Cognition Arousal/Alertness: Awake/alert Behavior During Therapy: WFL for tasks assessed/performed Overall Cognitive Status: No family/caregiver present to determine baseline cognitive functioning    Extremity/Trunk Assessment Upper Extremity Assessment Upper Extremity Assessment: Generalized weakness Lower Extremity Assessment Lower Extremity Assessment: Generalized weakness Cervical / Trunk Assessment Cervical / Trunk Assessment: Kyphotic   Balance Balance Balance Assessed: No  End of Session PT - End of  Session Equipment Utilized During Treatment: Gait belt Activity Tolerance: Patient tolerated treatment well Patient left: in bed;with call bell/phone within reach Nurse Communication: Mobility status  GP  Olivia Canter, [place] [phone] 09/25/2013, 2:32 PM

## 2013-09-25 NOTE — Progress Notes (Signed)
09/24/13 1900-0700. Pt.is A/Ox4 however had disorientation of time early this am around 0200. Pt. Received 2 units of PRBC's without any signs of reaction. Pt.became increasing agitated during the last blood transfusion because she wanted get out of bed during blood transfusion. Encouraged patient to stay in the bed until blood transfusion and vital signs were complete but patient remained agitated. Patient c/o left shoulder pain so prn tylenol po was offered as well as a warm heating pack patient refused both even after encouragement from RN and the NT. Later during the shift patient c/o SOB at 0458. VS were taken and lung sounds were auscultated patient was clear in upper lobes and diminished in lower lobes which was her baseline, no crackles or wheezing present, oxygen level was 100% on 2 L [place], and her breathing appeared regular and unlabored. Despite her c/o of being short of breath patient said she wanted to walk around, get out of bed, and wanted to make phone calls. Mid-level on call with [facility] was paged and notified. Was told to continue to monitor and if any changes from patient's baseline to call again. Patient had no more c/o shortness of breath and fell asleep in bed with no signs of distress noted. Oxygen saturation remained 98-100% on 2 L [place].

## 2013-09-25 NOTE — Progress Notes (Signed)
Patient ID: [name], female   DOB: Sep 13, 1924, 79 y.o.   MRN: [id number] Subjective:  No chest pain. Dyspnea improved. Still some diarrhea Objective:  Vital Signs in the last 24 hours: Temp:  [97.3 F (36.3 C)-99.1 F (37.3 C)] 98.3 F (36.8 C) (12/13 0628) Pulse Rate:  [111-142] 119 (12/13 1145) Resp:  [16-19] 18 (12/13 0838) BP: (97-119)/(41-90) 113/60 mmHg (12/13 1145) SpO2:  [98 %-100 %] 100 % (12/13 1145) Weight:  [148 lb 1.6 oz (67.178 kg)-149 lb 4 oz (67.7 kg)] 148 lb 1.6 oz (67.178 kg) (12/13 0456)  Intake/Output from previous day: 12/12 0701 - 12/13 0700 In: 591 [P.O.:360; I.V.:206; Blood:25] Out: 875 [Urine:875] Intake/Output from this shift: Total I/O In: 60 [P.O.:60] Out: -   Physical Exam: stable appearing elderly woman, NAD HEENT: Unremarkable Neck:  No JVD, no thyromegally Back:  No CVA tenderness Lungs:  Clear with no wheezes HEART:  IRegular tachy rhythm, no murmurs, no rubs, no clicks Abd:  soft, positive bowel sounds, no organomegally, no rebound, no guarding Ext:  2 plus pulses, no edema, no cyanosis, no clubbing Skin:  No rashes no nodules Neuro:  CN II through XII intact, motor grossly intact  Lab Results:  Recent Labs  09/24/13 1625 09/25/13 0923  WBC 6.6 9.6  HGB 7.7* 11.1*  PLT 80* 89*    Recent Labs  09/24/13 1625 09/25/13 0923  NA 136 137  K 4.7 4.9  CL 104 104  CO2 22 22  GLUCOSE 119* 108*  BUN 53* 53*  CREATININE 2.00* 1.90*   No results found for this basename: TROPONINI, CK, MB,  in the last 72 hours Hepatic Function Panel  Recent Labs  09/25/13 0923  PROT 6.4  ALBUMIN 3.0*  AST 13  ALT 6  ALKPHOS 67  BILITOT 0.6   No results found for this basename: CHOL,  in the last 72 hours No results found for this basename: PROTIME,  in the last 72 hours  Imaging: No results found.  Cardiac Studies: Tele - atrial fib with an RVR Assessment/Plan:  1. Atrial fib with an RVR 2. Acute on chronic renal  insufficiency 3. Diarrhea of over 3 months duration 4. Acute on chronic diastolic CHF 5. anemia Rec: a difficult constellation of problems. She had to have her cardizem stopped when she dropped her pressure. Once she has been transfused, would try to restart at low dose (5 mg/hour) and continue beta blocker. Ultimately her diarrhea will need treatment. Would keep volume replete. Use lasix for evidence of volume overload, and eval. Anemia. Consider GI eval for both anemia and diarrhea.  LOS: 1 day    Gregg Taylor,M.D. 09/25/2013, 11:56 AM

## 2013-09-25 NOTE — Progress Notes (Signed)
Echocardiogram 2D Echocardiogram has been performed.  [name] 09/25/2013, 2:56 PM

## 2013-09-25 NOTE — Progress Notes (Signed)
[facility] PROGRESS NOTE Interim History: 79 y.o. female  Past medical history of atrial fibrillation and tachybradycardia syndrome status post pacemaker, that went to [facility] because of dyspnea and generalized weakness which has progressively gotten worse. She's she was found to be in atrial fibrillation with RVR was started on diltiazem drip and held no blood pressures in the 90s, also hemoglobin was checked which was 7.5 (previously 10), due to not having cardiology over the weekend and as per family request to [facility]  Assessment/Plan: Atrial fibrillation with RVR - RVR improved, increase metorpolol - hold lasix now in AKI. - Baseline Cr < 1.0; on  12.12.2014 Cr. 2.0.   Normocytic Anemia: - FOBT x1 negative. CT abdomen and pelvis rule retroperitoneal bleed. Mild tenderness on LLQ. No leukocytosis. - Mild increase in Hbg still tachycardic and SOb - Transfuse 1 unit of PRBC hold lasix. - CBC in am.  AKI (acute kidney injury) - Most likely due to over diuresis. She cont to complains of thirst, check orthostatic, Urinary sodium and creatinine. - Liberalize diet. - PRBC should help with vol repletion.  Hypothyroidism: - cont synthroid.  Essential hypertension, benign - Bp improved.   Code Status: full  Family Communication: daughter and husband  Disposition Plan: inpatient    Consultants:  cardiology  Procedures:  none  Antibiotics:  None  HPI/Subjective: She relates she had a bad night feels dizzy upon standing.  Objective: Filed Vitals:   09/25/13 0456 09/25/13 0458 09/25/13 0628 09/25/13 0838  BP:  110/90 118/72 114/74  Pulse:  120 111 119  Temp:  97.4 F (36.3 C) 98.3 F (36.8 C)   TempSrc:  Oral Oral   Resp:  18 18 18   Height:      Weight: 67.178 kg (148 lb 1.6 oz)     SpO2:  100% 100% 100%    Intake/Output Summary (Last 24 hours) at 09/25/13 1018 Last data filed at 09/25/13 0917  Gross per 24 hour  Intake    651 ml  Output     875 ml  Net   -224 ml   Filed Weights   09/24/13 1350 09/25/13 0456  Weight: 67.7 kg (149 lb 4 oz) 67.178 kg (148 lb 1.6 oz)    Exam:  General: Alert, awake, oriented x3, in no acute distress.  HEENT: No bruits, no goiter. -JVD Heart: Regular rate and rhythm, without murmurs, rubs, gallops.  Lungs: Good air movement, clear to auscultation. Abdomen: Soft, nontender, nondistended, positive bowel sounds. Small bruise on her back. Mild Tenderness on her left lower quadrant. Neuro: Grossly intact, nonfocal.   Data Reviewed: Basic Metabolic Panel:  Recent Labs Lab 09/24/13 1625  NA 136  K 4.7  CL 104  CO2 22  GLUCOSE 119*  BUN 53*  CREATININE 2.00*  CALCIUM 8.5   Liver Function Tests: No results found for this basename: AST, ALT, ALKPHOS, BILITOT, PROT, ALBUMIN,  in the last 168 hours No results found for this basename: LIPASE, AMYLASE,  in the last 168 hours No results found for this basename: AMMONIA,  in the last 168 hours CBC:  Recent Labs Lab 09/24/13 1625 09/25/13 0923  WBC 6.6 9.6  HGB 7.7* 11.1*  HCT 25.0* 35.1*  MCV 90.9 90.5  PLT 80* 89*   Cardiac Enzymes: No results found for this basename: CKTOTAL, CKMB, CKMBINDEX, TROPONINI,  in the last 168 hours BNP (last 3 results) No results found for this basename: PROBNP,  in the last 8760 hours CBG:  Recent Labs Lab 09/24/13 1603  GLUCAP 124*    No results found for this or any previous visit (from the past 240 hour(s)).   Studies: No results found.  Scheduled Meds: . metoprolol tartrate  50 mg Oral BID  . pantoprazole  40 mg Oral Daily  . sodium chloride  3 mL Intravenous Q12H  . sodium chloride  3 mL Intravenous Q12H   Continuous Infusions:    Marinda Elk  [facility] Pager [phone]. If 8PM-8AM, please contact night-coverage at [email or web address], password [email or web address] 09/25/2013, 10:18 AM  LOS: 1 day

## 2013-09-26 DIAGNOSIS — F05 Delirium due to known physiological condition: Secondary | ICD-10-CM

## 2013-09-26 LAB — BASIC METABOLIC PANEL
CO2: 22 mEq/L (ref 19–32)
Calcium: 8.8 mg/dL (ref 8.4–10.5)
Chloride: 104 mEq/L (ref 96–112)
Creatinine, Ser: 1.92 mg/dL — ABNORMAL HIGH (ref 0.50–1.10)
GFR calc Af Amer: 26 mL/min — ABNORMAL LOW (ref >90)
Glucose, Bld: 103 mg/dL — ABNORMAL HIGH (ref 70–99)
Sodium: 138 mEq/L (ref 135–145)

## 2013-09-26 LAB — PROTIME-INR
INR: 1.8 — ABNORMAL HIGH (ref 0.00–1.49)
Prothrombin Time: 20.4 seconds — ABNORMAL HIGH (ref 11.6–15.2)

## 2013-09-26 MED ORDER — WARFARIN SODIUM 2.5 MG PO TABS
2.5000 mg | ORAL_TABLET | Freq: Once | ORAL | Status: DC
  Filled 2013-09-26 (×2): qty 1

## 2013-09-26 MED ORDER — DIGOXIN 0.25 MG/ML IJ SOLN
0.2500 mg | Freq: Every day | INTRAMUSCULAR | Status: DC
  Administered 2013-09-26 – 2013-09-27 (×2): 0.25 mg via INTRAVENOUS
  Filled 2013-09-26 (×3): qty 1

## 2013-09-26 MED ORDER — HALOPERIDOL LACTATE 5 MG/ML IJ SOLN
2.0000 mg | Freq: Four times a day (QID) | INTRAMUSCULAR | Status: DC | PRN
  Administered 2013-09-26 – 2013-09-30 (×3): 2 mg via INTRAVENOUS
  Filled 2013-09-26 (×3): qty 1

## 2013-09-26 MED ORDER — PSYLLIUM 95 % PO PACK
1.0000 | PACK | Freq: Every day | ORAL | Status: DC
  Administered 2013-09-28 – 2013-10-01 (×4): 1 via ORAL
  Filled 2013-09-26 (×7): qty 1

## 2013-09-26 MED ORDER — LOPERAMIDE HCL 2 MG PO CAPS: 2.0000 mg | ORAL_CAPSULE | ORAL | Status: DC | PRN

## 2013-09-26 MED ORDER — SACCHAROMYCES BOULARDII 250 MG PO CAPS
250.0000 mg | ORAL_CAPSULE | Freq: Two times a day (BID) | ORAL | Status: DC
  Administered 2013-09-26 – 2013-10-01 (×10): 250 mg via ORAL
  Filled 2013-09-26 (×12): qty 1

## 2013-09-26 MED ORDER — DILTIAZEM HCL 100 MG IV SOLR
5.0000 mg/h | INTRAVENOUS | Status: DC
  Administered 2013-09-26 – 2013-09-27 (×2): 5 mg/h via INTRAVENOUS
  Filled 2013-09-26 (×3): qty 100

## 2013-09-26 MED ORDER — METOPROLOL TARTRATE 1 MG/ML IV SOLN: 5.0000 mg | Freq: Four times a day (QID) | INTRAVENOUS | Status: DC | PRN

## 2013-09-26 MED ORDER — SODIUM CHLORIDE 0.9 % IV SOLN: 250.0000 mL | INTRAVENOUS | Status: DC | PRN

## 2013-09-26 MED ORDER — WARFARIN - PHARMACIST DOSING INPATIENT: Freq: Every day | Status: DC

## 2013-09-26 NOTE — Progress Notes (Signed)
Pt. Was unable to tolerate swallowing fluids or her soup, I advised pt.'s family not to give pt. Anything by mouth until her swallowing reflex was evaluated. MD notified and Bedside swallow evaluation ordered.
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
## 2013-09-26 NOTE — Progress Notes (Signed)
Patient awoke with c/o back pain, patient agreed to take PO Metoprolol and Ultram which she initially refused earlier in the shift.  Meds administered, will continue to monitor. [name]

## 2013-09-26 NOTE — Progress Notes (Signed)
[facility] PROGRESS NOTE Interim History: 79 y.o. female  Past medical history of atrial fibrillation and tachybradycardia syndrome status post pacemaker, that went to [facility] because of dyspnea and generalized weakness which has progressively gotten worse. She's she was found to be in atrial fibrillation with RVR was started on diltiazem drip and held no blood pressures in the 90s, also hemoglobin was checked which was 7.5 (previously 10), due to not having cardiology over the weekend and as per family request to [facility]  Assessment/Plan: Atrial fibrillation with RVR - RVR, cont metoprolol, use metoprolol IV PRN. - Further management per Cardiology. - hold lasix now in AKI. - Baseline Cr < 1.0;Cr. 2.0 ->1.7.   Acute confusional State/Delirium: - AVOID ativan. - Use haldol PRN. - Most likely due to ativan and AKI.  Diarrhea: - Recently started on PPI. Will go ahead and hold it. - Check C.dif, pro-biotics and metamucil. - Avoid sorbitol sweeteners.  Normocytic Anemia: - FOBT x1 negative. CT abdomen and pelvis negative for bleed. - S/p 2 units  PRBC 12.12. &12.13.2014  AKI (acute kidney injury) - Most likely due to over diuresis and diarrhea..  - Fena < 1%. - Liberalize diet.  Hypothyroidism: - cont synthroid.  Essential hypertension, benign - Bp improved.   Code Status: full  Family Communication: daughter and husband  Disposition Plan: inpatient    Consultants:  cardiology  Procedures:  none  Antibiotics:  None  HPI/Subjective: Agitated and confused overnight. Now sleeping.  Objective: Filed Vitals:   09/25/13 1425 09/25/13 1448 09/25/13 2005 09/26/13 0627  BP:  122/91 119/68 144/79  Pulse:  130 116 130  Temp:  97.5 F (36.4 C) 97.6 F (36.4 C) 97.9 F (36.6 C)  TempSrc:  Oral Oral Oral  Resp:  18 20 20   Height:      Weight:    66.3 kg (146 lb 2.6 oz)  SpO2: 96% 100% 100% 100%    Intake/Output Summary (Last 24 hours) at  09/26/13 1003 Last data filed at 09/26/13 0631  Gross per 24 hour  Intake  472.5 ml  Output   1600 ml  Net -1127.5 ml   Filed Weights   09/24/13 1350 09/25/13 0456 09/26/13 0627  Weight: 67.7 kg (149 lb 4 oz) 67.178 kg (148 lb 1.6 oz) 66.3 kg (146 lb 2.6 oz)    Exam:  General: Alert, awake, oriented x3, in no acute distress.  HEENT: No bruits, no goiter. -JVD Heart: Regular rate and rhythm, without murmurs, rubs, gallops.  Lungs: Good air movement, clear to auscultation. Abdomen: Soft, nontender, nondistended, positive bowel sounds. Small bruise on her back. Mild Tenderness on her left lower quadrant. Neuro: Grossly intact, nonfocal.   Data Reviewed: Basic Metabolic Panel:  Recent Labs Lab 09/24/13 1625 09/25/13 0923  NA 136 137  K 4.7 4.9  CL 104 104  CO2 22 22  GLUCOSE 119* 108*  BUN 53* 53*  CREATININE 2.00* 1.90*  CALCIUM 8.5 8.5   Liver Function Tests:  Recent Labs Lab 09/25/13 0923  AST 13  ALT 6  ALKPHOS 67  BILITOT 0.6  PROT 6.4  ALBUMIN 3.0*   No results found for this basename: LIPASE, AMYLASE,  in the last 168 hours No results found for this basename: AMMONIA,  in the last 168 hours CBC:  Recent Labs Lab 09/24/13 1625 09/25/13 0923  WBC 6.6 9.6  HGB 7.7* 11.1*  HCT 25.0* 35.1*  MCV 90.9 90.5  PLT 80* 89*   Cardiac Enzymes:  No results found for this basename: CKTOTAL, CKMB, CKMBINDEX, TROPONINI,  in the last 168 hours BNP (last 3 results) No results found for this basename: PROBNP,  in the last 8760 hours CBG:  Recent Labs Lab 09/24/13 1603  GLUCAP 124*    No results found for this or any previous visit (from the past 240 hour(s)).   Studies: Ct Abdomen Pelvis Wo Contrast  09/25/2013   CLINICAL DATA:  Left lower quadrant tenderness, anemia, evaluate for retroperitoneal hemorrhage  EXAM: CT ABDOMEN AND PELVIS WITHOUT CONTRAST  TECHNIQUE: Multidetector CT imaging of the abdomen and pelvis was performed following the standard  protocol without intravenous contrast.  COMPARISON:  01/11/2008  FINDINGS: Coronary atherosclerosis. Left subclavian pacemaker, incompletely visualized.  Moderate right and small left pleural effusions. Associated lower lobe opacities, likely atelectasis. Mild interstitial edema is suspected (series 4/ image 3).  Unenhanced liver, pancreas, and adrenal glands are within normal limits.  Splenomegaly, measuring 20.6 cm in craniocaudal dimension.  Status post cholecystectomy. No intrahepatic or extrahepatic ductal dilatation.  Bilateral renal cortical atrophy with multiple renal cysts, including a 2.7 cm lateral left upper pole renal cyst (series 3/ image 31) and a 2.3 cm medial right lower pole renal cyst (series 3/image 40). No renal calculi or hydronephrosis.  No evidence of bowel obstruction. Extensive colonic diverticulosis, without associated inflammatory changes.  Atherosclerotic calcifications of the abdominal aorta and branch vessels.  Small volume abdominopelvic ascites, likely simple. Perihepatic ascites measures just above simple fluid density, although this likely spuriously elevated due to streak artifact from the patient's arms.  Body wall edema.  No evidence of retroperitoneal hemorrhage.  No suspicious abdominopelvic lymphadenopathy.  Bladder is decompressed by indwelling Foley catheter.  Uterus and bilateral ovaries are poorly evaluated.  Tiny right and small left fat containing inguinal hernias. Associated 2.8 x 3.2 cm fluid collection within the left hernia (series 3/image 88).  Degenerative changes of the visualized thoracolumbar spine. Exaggerated lower thoracic kyphosis.  IMPRESSION: No evidence of retroperitoneal hemorrhage.  Splenomegaly, measuring 20.6 cm in craniocaudal dimension.  Moderate right and small left pleural effusions with possible mild interstitial edema.  Small volume abdominopelvic ascites, simple.  Body wall edema.   Electronically Signed   By: Charline Bills M.D.   On:  09/25/2013 18:14    Scheduled Meds: . metoprolol tartrate  50 mg Oral BID  . pantoprazole  40 mg Oral Daily  . pneumococcal 23 valent vaccine  0.5 mL Intramuscular Tomorrow-1000  . saccharomyces boulardii  250 mg Oral BID  . sodium chloride  3 mL Intravenous Q12H   Continuous Infusions:    Marinda Elk  [facility] Pager [phone]. If 8PM-8AM, please contact night-coverage at [email or web address], password [facility] 09/26/2013, 10:03 AM  LOS: 2 days

## 2013-09-26 NOTE — Progress Notes (Addendum)
ANTICOAGULATION CONSULT NOTE - Follow Up Consult  Pharmacy Consult for Coumadin Indication: atrial fibrillation  Allergies  Allergen Reactions  . Penicillins Rash    Patient Measurements: Height: 4\' 10"  (147.3 cm) Weight: 146 lb 2.6 oz (66.3 kg) (bed scale ) IBW/kg (Calculated) : 40.9 Heparin Dosing Weight:   Vital Signs: Temp: 97.9 F (36.6 C) (12/14 0627) Temp src: Oral (12/14 0627) BP: 131/93 mmHg (12/14 1404) Pulse Rate: 128 (12/14 1404)  Labs:  Recent Labs  09/24/13 1625 09/25/13 0923 09/26/13 1215  HGB 7.7* 11.1*  --   HCT 25.0* 35.1*  --   PLT 80* 89*  --   CREATININE 2.00* 1.90* 1.92*   Lab Results  Component Value Date   INR 1.80* 09/26/2013   INR 2.1 02/26/2013   INR 2.3 01/15/2013     Estimated Creatinine Clearance: 16.7 ml/min (by C-G formula based on Cr of 1.92).   Medications:  Scheduled:  . digoxin  0.25 mg Intravenous Daily  . metoprolol tartrate  50 mg Oral BID  . pneumococcal 23 valent vaccine  0.5 mL Intramuscular Tomorrow-1000  . psyllium  1 packet Oral Daily  . saccharomyces boulardii  250 mg Oral BID  . sodium chloride  3 mL Intravenous Q12H    Assessment: 79yo female with AFib, on Coumadin 2.5mg  daily x 5mg  TTS at home and admitted to [facility] with INR = 4 prior to transfer here.  Pt now s/p tranfusion, for Hg 7.7, with Hg 11.1 and abdominal CT (-)RP bleed, FOB (-) and to resume Coumadin.    Current INR 1.8.  Goal of Therapy:  INR 2-3 Monitor platelets by anticoagulation protocol: Yes   Plan:  1. Coumadin 2.5 mg x 1 - will start a little more slowly given recent drop in Hgb. 2. Continue daily PT/INR.  Tad Moore, BCPS  Clinical Pharmacist Pager [phone]  09/26/2013 4:33 PM

## 2013-09-26 NOTE — Progress Notes (Signed)
Patient ID: [name], female   DOB: 07-Dec-1923, 79 y.o.   MRN: [id number] Subjective:  No chest pain. Dyspnea improved. Still weak Objective:  Vital Signs in the last 24 hours: Temp:  [97.3 F (36.3 C)-97.9 F (36.6 C)] 97.9 F (36.6 C) (12/14 0627) Pulse Rate:  [104-130] 130 (12/14 0627) Resp:  [18-20] 20 (12/14 0627) BP: (93-144)/(50-91) 144/79 mmHg (12/14 0627) SpO2:  [96 %-100 %] 100 % (12/14 0627) Weight:  [146 lb 2.6 oz (66.3 kg)] 146 lb 2.6 oz (66.3 kg) (12/14 0627)  Intake/Output from previous day: 12/13 0701 - 12/14 0700 In: 532.5 [P.O.:420; I.V.:100; Blood:12.5] Out: 1600 [Urine:1600] Intake/Output from this shift: Total I/O In: 0  Out: 100 [Urine:100]  Physical Exam: stable chronically ill appearing elderly woman, NAD HEENT: Unremarkable Neck:  No JVD, no thyromegally Back:  No CVA tenderness Lungs:  Clear with no wheezes HEART:  IRegular tachy rhythm, no murmurs, no rubs, no clicks Abd:  soft, positive bowel sounds, no organomegally, no rebound, no guarding Ext:  2 plus pulses, no edema, no cyanosis, no clubbing Skin:  No rashes no nodules Neuro:  CN II through XII intact, motor grossly intact  Lab Results:  Recent Labs  09/24/13 1625 09/25/13 0923  WBC 6.6 9.6  HGB 7.7* 11.1*  PLT 80* 89*    Recent Labs  09/24/13 1625 09/25/13 0923  NA 136 137  K 4.7 4.9  CL 104 104  CO2 22 22  GLUCOSE 119* 108*  BUN 53* 53*  CREATININE 2.00* 1.90*   No results found for this basename: TROPONINI, CK, MB,  in the last 72 hours Hepatic Function Panel  Recent Labs  09/25/13 0923  PROT 6.4  ALBUMIN 3.0*  AST 13  ALT 6  ALKPHOS 67  BILITOT 0.6   No results found for this basename: CHOL,  in the last 72 hours No results found for this basename: PROTIME,  in the last 72 hours  Imaging: Ct Abdomen Pelvis Wo Contrast  09/25/2013   CLINICAL DATA:  Left lower quadrant tenderness, anemia, evaluate for retroperitoneal hemorrhage  EXAM: CT ABDOMEN AND  PELVIS WITHOUT CONTRAST  TECHNIQUE: Multidetector CT imaging of the abdomen and pelvis was performed following the standard protocol without intravenous contrast.  COMPARISON:  01/11/2008  FINDINGS: Coronary atherosclerosis. Left subclavian pacemaker, incompletely visualized.  Moderate right and small left pleural effusions. Associated lower lobe opacities, likely atelectasis. Mild interstitial edema is suspected (series 4/ image 3).  Unenhanced liver, pancreas, and adrenal glands are within normal limits.  Splenomegaly, measuring 20.6 cm in craniocaudal dimension.  Status post cholecystectomy. No intrahepatic or extrahepatic ductal dilatation.  Bilateral renal cortical atrophy with multiple renal cysts, including a 2.7 cm lateral left upper pole renal cyst (series 3/ image 31) and a 2.3 cm medial right lower pole renal cyst (series 3/image 40). No renal calculi or hydronephrosis.  No evidence of bowel obstruction. Extensive colonic diverticulosis, without associated inflammatory changes.  Atherosclerotic calcifications of the abdominal aorta and branch vessels.  Small volume abdominopelvic ascites, likely simple. Perihepatic ascites measures just above simple fluid density, although this likely spuriously elevated due to streak artifact from the patient's arms.  Body wall edema.  No evidence of retroperitoneal hemorrhage.  No suspicious abdominopelvic lymphadenopathy.  Bladder is decompressed by indwelling Foley catheter.  Uterus and bilateral ovaries are poorly evaluated.  Tiny right and small left fat containing inguinal hernias. Associated 2.8 x 3.2 cm fluid collection within the left hernia (series 3/image 88).  Degenerative  changes of the visualized thoracolumbar spine. Exaggerated lower thoracic kyphosis.  IMPRESSION: No evidence of retroperitoneal hemorrhage.  Splenomegaly, measuring 20.6 cm in craniocaudal dimension.  Moderate right and small left pleural effusions with possible mild interstitial edema.   Small volume abdominopelvic ascites, simple.  Body wall edema.   Electronically Signed   By: Charline Bills M.D.   On: 09/25/2013 18:14    Cardiac Studies: Tele - atrial fib with an RVR Assessment/Plan:  1. Atrial fib with an RVR 2. Acute on chronic renal insufficiency 3. Diarrhea of over 3 months duration, possibly due to Protonix 4. Acute on chronic diastolic CHF 5. Anemia  Rec: as BP a bit better will increase AV nodal blocking meds. Hopefully dc of protonix will help with diarrhea. Workup ongoing. She has anorexia and needs encouragement to eat. Discussed with family.     LOS: 2 days    Gregg Taylor,M.D. 09/26/2013, 10:58 AM

## 2013-09-26 NOTE — Progress Notes (Signed)
Patient is currently resting comfortably, no signs of distress noted. Will continue to monitor. Troy Sine

## 2013-09-26 NOTE — Progress Notes (Signed)
RN who performed swallow evaluation advised me that pt. Failed the swallowing evaluation. MD notified and pt. Made NPO per MD's orders pending SLP Therapy evaluation. Pt.'s family advised of results of bedside swallow evaluation and of her NPO status until further notice.

## 2013-09-26 NOTE — Progress Notes (Signed)
ANTICOAGULATION CONSULT NOTE - Follow Up Consult  Pharmacy Consult for Coumadin Indication: atrial fibrillation  Allergies  Allergen Reactions  . Penicillins Rash    Patient Measurements: Height: 4\' 10"  (147.3 cm) Weight: 146 lb 2.6 oz (66.3 kg) (bed scale ) IBW/kg (Calculated) : 40.9 Heparin Dosing Weight:   Vital Signs: Temp: 97.9 F (36.6 C) (12/14 0627) Temp src: Oral (12/14 0627) BP: 131/93 mmHg (12/14 1404) Pulse Rate: 128 (12/14 1404)  Labs:  Recent Labs  09/24/13 1625 09/25/13 0923 09/26/13 1215  HGB 7.7* 11.1*  --   HCT 25.0* 35.1*  --   PLT 80* 89*  --   CREATININE 2.00* 1.90* 1.92*    Estimated Creatinine Clearance: 16.7 ml/min (by C-G formula based on Cr of 1.92).   Medications:  Scheduled:  . digoxin  0.25 mg Intravenous Daily  . metoprolol tartrate  50 mg Oral BID  . pneumococcal 23 valent vaccine  0.5 mL Intramuscular Tomorrow-1000  . psyllium  1 packet Oral Daily  . saccharomyces boulardii  250 mg Oral BID  . sodium chloride  3 mL Intravenous Q12H    Assessment: 79yo female with AFib, on Coumadin 2.5mg  daily x 5mg  TTS at home and admitted to [facility] with INR = 4 prior to transfer here.  Pt now s/p tranfusion, for Hg 7.7, with Hg 11.1 and abdominal CT (-)RP bleed, FOB (-) and to resume Coumadin.  No INR has been checked since transfer.  Goal of Therapy:  INR 2-3 Monitor platelets by anticoagulation protocol: Yes   Plan:  INR now and daily  Marisue Humble, PharmD Clinical Pharmacist [facility]

## 2013-09-27 ENCOUNTER — Encounter (HOSPITAL_COMMUNITY): Payer: Self-pay | Admitting: *Deleted

## 2013-09-27 ENCOUNTER — Inpatient Hospital Stay (HOSPITAL_COMMUNITY): Payer: Medicare Other

## 2013-09-27 DIAGNOSIS — I495 Sick sinus syndrome: Secondary | ICD-10-CM

## 2013-09-27 DIAGNOSIS — Z950 Presence of cardiac pacemaker: Secondary | ICD-10-CM

## 2013-09-27 DIAGNOSIS — Z7901 Long term (current) use of anticoagulants: Secondary | ICD-10-CM

## 2013-09-27 LAB — BASIC METABOLIC PANEL
BUN: 52 mg/dL — ABNORMAL HIGH (ref 6–23)
Chloride: 97 mEq/L (ref 96–112)
Creatinine, Ser: 1.44 mg/dL — ABNORMAL HIGH (ref 0.50–1.10)
GFR calc Af Amer: 37 mL/min — ABNORMAL LOW (ref >90)
GFR calc non Af Amer: 32 mL/min — ABNORMAL LOW (ref >90)

## 2013-09-27 LAB — CLOSTRIDIUM DIFFICILE BY PCR: Toxigenic C. Difficile by PCR: NEGATIVE

## 2013-09-27 LAB — PROTIME-INR: Prothrombin Time: 20.9 seconds — ABNORMAL HIGH (ref 11.6–15.2)

## 2013-09-27 MED ORDER — METOPROLOL TARTRATE 100 MG PO TABS
100.0000 mg | ORAL_TABLET | Freq: Two times a day (BID) | ORAL | Status: DC
  Administered 2013-09-27 – 2013-10-01 (×9): 100 mg via ORAL
  Filled 2013-09-27 (×10): qty 1

## 2013-09-27 MED ORDER — SODIUM CHLORIDE 0.9 % IV SOLN: 250.0000 mL | INTRAVENOUS | Status: AC | PRN

## 2013-09-27 MED ORDER — WARFARIN SODIUM 2.5 MG PO TABS
2.5000 mg | ORAL_TABLET | Freq: Once | ORAL | Status: AC
  Administered 2013-09-27: 17:00:00 2.5 mg via ORAL
  Filled 2013-09-27: qty 1

## 2013-09-27 MED ORDER — SODIUM CHLORIDE 0.9 % IV SOLN: 250.0000 mL | INTRAVENOUS | Status: DC | PRN

## 2013-09-27 NOTE — Progress Notes (Signed)
Physical Therapy Treatment Patient Details Name: [name] MRN: [id number] DOB: Mar 06, 1924 Today's Date: 09/27/2013 Time: 1478-2956 PT Time Calculation (min): 26 min  PT Assessment / Plan / Recommendation  History of Present Illness patient admitted with Afib, diarrhea and anemia.  Workup still in progress.   PT Comments   Pt progressing with mobility able to ambulate in room to door and back today. Pt continues to report fatigue and not back to baseline of assisting spouse of 85yrs. Will follow acutely and as long as family can assist at home she could return vs ST-SNF. Recommend daily mobility with nursing.  Follow Up Recommendations  Supervision/Assistance - 24 hour;Home health PT;SNF (pending progress and family assist)     Does the patient have the potential to tolerate intense rehabilitation     Barriers to Discharge        Equipment Recommendations       Recommendations for Other Services    Frequency     Progress towards PT Goals Progress towards PT goals: Progressing toward goals  Plan Current plan remains appropriate    Precautions / Restrictions Precautions Precautions: Fall   Pertinent Vitals/Pain No pain HR 102 sats 98% on RA    Mobility  Bed Mobility Bed Mobility: Not assessed Details for Bed Mobility Assistance: pt in recliner before and after session Transfers Sit to Stand: 4: Min assist;From chair/3-in-1;With upper extremity assist Stand to Sit: 4: Min assist;To chair/3-in-1;To bed;With upper extremity assist Ambulation/Gait Ambulation/Gait Assistance: 4: Min assist Ambulation Distance (Feet): 44 Feet Assistive device: Rolling walker Ambulation/Gait Assistance Details: cues for posture, positiion in RW and assist to avoid obstacles, pt pushes RW too far away and has difficulty steering without pivot wheels Gait Pattern: Step-through pattern;Decreased stride length;Trunk flexed Gait velocity: decreased Stairs: No    Exercises General Exercises  - Lower Extremity Long Arc Quad: AROM;Seated;Both;15 reps Hip ABduction/ADduction: AROM;Seated;Both;10 reps Hip Flexion/Marching: AROM;Seated;Both;15 reps Toe Raises: AROM;Seated;Both;15 reps Heel Raises: AROM;Seated;Both;15 reps   PT Diagnosis:    PT Problem List:   PT Treatment Interventions:     PT Goals (current goals can now be found in the care plan section)    Visit Information  Last PT Received On: 09/27/13 Assistance Needed: +1 History of Present Illness: patient admitted with Afib, diarrhea and anemia.  Workup still in progress.    Subjective Data      Cognition  Cognition Arousal/Alertness: Awake/alert Behavior During Therapy: WFL for tasks assessed/performed Overall Cognitive Status: Within Functional Limits for tasks assessed    Balance     End of Session PT - End of Session Equipment Utilized During Treatment: Gait belt Activity Tolerance: Patient tolerated treatment well Patient left: in chair;with call bell/phone within reach;with family/visitor present Nurse Communication: Mobility status   GP     Delorse Lek 09/27/2013, 3:06 PM Delaney Meigs, PT [phone]

## 2013-09-27 NOTE — Progress Notes (Signed)
Pt a/o, no c/o pain, pt NPO d/t failed swallow eval, pt stable, will continue to monitor

## 2013-09-27 NOTE — Progress Notes (Signed)
Patient ID: [name], female   DOB: 09-25-1924, 79 y.o.   MRN: [id number] Subjective:  No chest pain. Dyspnea improved. Still weak  Objective:  Vital Signs in the last 24 hours: Temp:  [97.4 F (36.3 C)-98.2 F (36.8 C)] 98.2 F (36.8 C) (12/15 0351) Pulse Rate:  [84-128] 90 (12/15 0351) Resp:  [20] 20 (12/15 0351) BP: (107-131)/(72-96) 107/87 mmHg (12/15 0351) SpO2:  [93 %-97 %] 93 % (12/15 0351) Weight:  [146 lb 9.7 oz (66.5 kg)] 146 lb 9.7 oz (66.5 kg) (12/15 0351)  Intake/Output from previous day: 12/14 0701 - 12/15 0700 In: 120 [P.O.:120] Out: 525 [Urine:525] Intake/Output from this shift:    Physical Exam: stable chronically ill appearing elderly woman, NAD HEENT: Unremarkable Neck:  No JVD, no thyromegally Back:  No CVA tenderness Lungs:  Clear with no wheezes HEART:  IRegular tachy rhythm, no murmurs, no rubs, no clicks Abd:  soft, positive bowel sounds, no organomegally, no rebound, no guarding Ext:  2 plus pulses, no edema, no cyanosis, no clubbing Skin:  No rashes no nodules Neuro:  CN II through XII intact, motor grossly intact  Lab Results:  Recent Labs  09/24/13 1625 09/25/13 0923  WBC 6.6 9.6  HGB 7.7* 11.1*  PLT 80* 89*    Recent Labs  09/26/13 1215 09/27/13 0359  NA 138 137  K 5.1 3.6  CL 104 97  CO2 22 30  GLUCOSE 103* 102*  BUN 59* 52*  CREATININE 1.92* 1.44*   No results found for this basename: TROPONINI, CK, MB,  in the last 72 hours Hepatic Function Panel  Recent Labs  09/25/13 0923  PROT 6.4  ALBUMIN 3.0*  AST 13  ALT 6  ALKPHOS 67  BILITOT 0.6   No results found for this basename: CHOL,  in the last 72 hours No results found for this basename: PROTIME,  in the last 72 hours  Imaging: Ct Abdomen Pelvis Wo Contrast  09/25/2013   CLINICAL DATA:  Left lower quadrant tenderness, anemia, evaluate for retroperitoneal hemorrhage  EXAM: CT ABDOMEN AND PELVIS WITHOUT CONTRAST  TECHNIQUE: Multidetector CT imaging of the  abdomen and pelvis was performed following the standard protocol without intravenous contrast.  COMPARISON:  01/11/2008  FINDINGS: Coronary atherosclerosis. Left subclavian pacemaker, incompletely visualized.  Moderate right and small left pleural effusions. Associated lower lobe opacities, likely atelectasis. Mild interstitial edema is suspected (series 4/ image 3).  Unenhanced liver, pancreas, and adrenal glands are within normal limits.  Splenomegaly, measuring 20.6 cm in craniocaudal dimension.  Status post cholecystectomy. No intrahepatic or extrahepatic ductal dilatation.  Bilateral renal cortical atrophy with multiple renal cysts, including a 2.7 cm lateral left upper pole renal cyst (series 3/ image 31) and a 2.3 cm medial right lower pole renal cyst (series 3/image 40). No renal calculi or hydronephrosis.  No evidence of bowel obstruction. Extensive colonic diverticulosis, without associated inflammatory changes.  Atherosclerotic calcifications of the abdominal aorta and branch vessels.  Small volume abdominopelvic ascites, likely simple. Perihepatic ascites measures just above simple fluid density, although this likely spuriously elevated due to streak artifact from the patient's arms.  Body wall edema.  No evidence of retroperitoneal hemorrhage.  No suspicious abdominopelvic lymphadenopathy.  Bladder is decompressed by indwelling Foley catheter.  Uterus and bilateral ovaries are poorly evaluated.  Tiny right and small left fat containing inguinal hernias. Associated 2.8 x 3.2 cm fluid collection within the left hernia (series 3/image 88).  Degenerative changes of the visualized thoracolumbar spine. Exaggerated  lower thoracic kyphosis.  IMPRESSION: No evidence of retroperitoneal hemorrhage.  Splenomegaly, measuring 20.6 cm in craniocaudal dimension.  Moderate right and small left pleural effusions with possible mild interstitial edema.  Small volume abdominopelvic ascites, simple.  Body wall edema.    Electronically Signed   By: Charline Bills M.D.   On: 09/25/2013 18:14    Cardiac Studies: Tele - atrial fib with V rates 100-120  Assessment/Plan:  1. Atrial fib with an RVR 2. Acute on chronic renal insufficiency 3. Diarrhea of over 3 months duration, possibly due to Protonix 4. Acute on chronic diastolic CHF 5. Anemia  Renal failure is improving with hydration.  I suspect that RVR will continue to improve as treatement of her underlying medical disease improves.  Will interrogate her pacemaker today. Increase metoprolol to 100mg  BID as BP allows. Continue anticoagulation.     LOS: 3 days    Case Vassell,M.D. 09/27/2013, 8:28 AM

## 2013-09-27 NOTE — Procedures (Signed)
Objective Swallowing Evaluation:    Patient Details  Name: [name] MRN: [id number] Date of Birth: [birth year]  Today's Date: 09/27/2013 Time: 1300-1335 SLP Time Calculation (min): 35 min  Past Medical History:  Past Medical History  Diagnosis Date  . GERD (gastroesophageal reflux disease)   . Carotid stenosis     a. 92011 Carotid u/s: Right 50-69%, left <50%  . Atrial fibrillation     a. chronic coumadin;  b. 07/2011 Echo: EF 65%, triv MR, mildly dil LA.  . Tachy-brady syndrome     a. 06/2010: SJM Accent RF DR model [id number] (serial number [id number])  . Essential hypertension, benign   . Hypothyroidism     a. Dx 09/2013   Past Surgical History:  Past Surgical History  Procedure Laterality Date  . Total knee arthroplasty      Right  . Laparoscopic cholecystectomy    . Pacemaker insertion  06/29/10    SJM implanted by Dr Johney Frame for tachy/brady syndrome   HPI:  Past medical history of atrial fibrillation and tachybradycardia syndrome status post pacemaker, that went to [facility] because of dyspnea and generalized weakness which has progressively gotten worse. She's she was found to be in atrial fibrillation with RVR was started on diltiazem drip and held no blood pressures in the 90s, also hemoglobin was checked which was 7.5 (previously 10), due to not having cardiology over the weekend and as per [facility] request to [place] [facility]. She relates she continues to be weak and mildly short of breath.also some shortness of breath with minimal exertion.     Assessment / Plan / Recommendation Clinical Impression  Dysphagia Diagnosis: Mild pharyngeal phase dysphagia Clinical impression: Pt. had only a mild pharyngeal dysphagia characterized by decreased tongue base contraction to the pharyngeal wall and decreased pharyngeal perestalsis, resulting residue of mechanical substance in the valleculae, which cleared with a repeat dry swallow.  The pill lodged in the valleculae and would not  clear with liquids, but did clear with a bite of puree.  The pill also briefly lodged in the distal esophagus, but cleared with sip of thin liquid.  There was no aspiration or penetration with any consistency, even with consecutive large swallows of thin via straw.    Treatment Recommendation  No treatment recommended at this time    Diet Recommendation Dysphagia 3 (Mechanical Soft);Thin liquid   Liquid Administration via: Straw Medication Administration: Whole meds with puree Supervision: Patient able to self feed;Staff to assist with self feeding;Full supervision/cueing for compensatory strategies Compensations: Slow rate;Small sips/bites;Follow solids with liquid Postural Changes and/or Swallow Maneuvers: Seated upright 90 degrees    Other  Recommendations Oral Care Recommendations: Oral care BID;Staff/trained caregiver to provide oral care Other Recommendations: Clarify dietary restrictions   Follow Up Recommendations  24 hour supervision/assistance    Frequency and Duration        Pertinent Vitals/Pain n/a    SLP Swallow Goals  n/a   General HPI: Past medical history of atrial fibrillation and tachybradycardia syndrome status post pacemaker, that went to [facility] because of dyspnea and generalized weakness which has progressively gotten worse. She's she was found to be in atrial fibrillation with RVR was started on diltiazem drip and held no blood pressures in the 90s, also hemoglobin was checked which was 7.5 (previously 10), due to not having cardiology over the weekend and as per [facility] request to [place] [facility]. She relates she continues to be weak and mildly short of breath.also some shortness of breath  with minimal exertion. Reason for Referral: Objectively evaluate swallowing function (r/o silent aspiration) Previous Swallow Assessment: BSE no overt s/s of aspiration, but pt. reports choking episodes at times and increased respiratory effort during meals..  Objective eval  to r/o silent aspiration. Diet Prior to this Study: NPO Temperature Spikes Noted: No Respiratory Status: Room air History of Recent Intubation: No Behavior/Cognition: Alert;Cooperative;Pleasant mood;Requires cueing Oral Cavity - Dentition: Missing dentition;Dentures, not available;Poor condition Oral Motor / Sensory Function: Within functional limits Self-Feeding Abilities: Needs assist Patient Positioning: Upright in chair Baseline Vocal Quality: Clear Volitional Cough: Weak Volitional Swallow: Able to elicit Anatomy: Within functional limits Pharyngeal Secretions: Not observed secondary MBS    Reason for Referral Objectively evaluate swallowing function (r/o silent aspiration)   Oral Phase Oral Preparation/Oral Phase Oral Phase: WFL   Pharyngeal Phase Pharyngeal Phase Pharyngeal Phase: Impaired Pharyngeal - Solids Pharyngeal - Regular: Pharyngeal residue - valleculae Pharyngeal - Pill: Pharyngeal residue - valleculae  Cervical Esophageal Phase    GO    Cervical Esophageal Phase Cervical Esophageal Phase: WFL (Pill lodged in distal esophagus, but cleared with next swall)         Maryjo Rochester T 09/27/2013, 1:44 PM

## 2013-09-27 NOTE — Clinical Documentation Improvement (Signed)
Admitted with AF with RVR, acute on chronic diastolic CHF, AKI and CKD.   Patient is a white female  Creatinine Levels this admit range from 1.90 - 2.00  GFR range this admit 21 - 23.  Please clarify the Stage of CKD you feel the patient is in.  _______CKD Stage I - GFR > OR = 90 _______CKD Stage II - GFR 60-80 _______CKD Stage III - GFR 30-59 _______CKD Stage IV - GFR 15-29 _______CKD Stage V - GFR < 15 _______ESRD (End Stage Renal Disease)    Thank You, Shellee Milo ,RN Clinical Documentation Specialist:  [phone]  [facility]- Health Information Management

## 2013-09-27 NOTE — Clinical Documentation Improvement (Signed)
Normocytic anemia is documented but this codes to unspecified anemia. Patient noted to have left flank bruise/ecchymosis.   Hgb 7.7  Transfused PRBC's  Post transfusion Hgb = 11  Please further specify the type of anemia if known  Acute Blood Loss Anemia Acute on chronic blood loss anemia Chronic blood loss anemia Other Condition   Thank You, Shellee Milo ,RN Clinical Documentation Specialist:  [phone]  [facility]- Health Information Management

## 2013-09-27 NOTE — Progress Notes (Signed)
[facility] PROGRESS NOTE Interim History: 79 y.o. female  Past medical history of atrial fibrillation and tachybradycardia syndrome status post pacemaker, that went to [facility] because of dyspnea and generalized weakness which has progressively gotten worse. She's she was found to be in atrial fibrillation with RVR was started on diltiazem drip and held no blood pressures in the 90s, also hemoglobin was checked which was 7.5 (previously 10), due to not having cardiology over the weekend and as per family request to [place] [facility]  Assessment/Plan: Atrial fibrillation with RVR - RVR, cont metoprolol, use metoprolol IV PRN. - Appreciate Cardiology assistance. - Hold lasix now in AKI. - Baseline Cr < 1.0;Cr. 2.0 ->1.7-> 1.4.   AKI (acute kidney injury) - Most likely due to over diuresis and diarrhea.  - Cr. Is improving. Start gentle IV fluids - Fena < 1%.  Acute confusional State/Delirium: - AVOID ativan. - Use haldol PRN. - Most likely due to ativan and AKI.  Diarrhea: - Recently started on PPI. Will go ahead and hold it. - Negative C.dif, pro-biotics and metamucil. - Avoid sorbitol sweeteners.  Normocytic Anemia: - FOBT x1 negative. CT abdomen and pelvis negative for bleed. - S/p 2 units  PRBC 12.12. &12.13.2014  Hypothyroidism: - cont synthroid.  Essential hypertension, benign - Bp improved.   Code Status: full  Family Communication: daughter and husband  Disposition Plan: inpatient    Consultants:  cardiology  Procedures:  none  Antibiotics:  None  HPI/Subjective: Sleepy relates she did not have a good night sleep. Objective: Filed Vitals:   09/26/13 1800 09/26/13 1900 09/26/13 2057 09/27/13 0351  BP: 131/89 129/78 121/96 107/87  Pulse: 100 102 84 90  Temp:   97.4 F (36.3 C) 98.2 F (36.8 C)  TempSrc:   Oral Oral  Resp:   20 20  Height:      Weight:    66.5 kg (146 lb 9.7 oz)  SpO2:   97% 93%    Intake/Output Summary (Last 24  hours) at 09/27/13 1024 Last data filed at 09/27/13 0900  Gross per 24 hour  Intake    120 ml  Output    425 ml  Net   -305 ml   Filed Weights   09/25/13 0456 09/26/13 0627 09/27/13 0351  Weight: 67.178 kg (148 lb 1.6 oz) 66.3 kg (146 lb 2.6 oz) 66.5 kg (146 lb 9.7 oz)    Exam:  General: Alert, awake, oriented x3, in no acute distress.  HEENT: No bruits, no goiter. -JVD Heart: Regular rate and rhythm, without murmurs, rubs, gallops.  Lungs: Good air movement, clear to auscultation. Abdomen: Soft, nontender, nondistended, positive bowel sounds.   Data Reviewed: Basic Metabolic Panel:  Recent Labs Lab 09/24/13 1625 09/25/13 0923 09/26/13 1215 09/27/13 0359  NA 136 137 138 137  K 4.7 4.9 5.1 3.6  CL 104 104 104 97  CO2 22 22 22 30   GLUCOSE 119* 108* 103* 102*  BUN 53* 53* 59* 52*  CREATININE 2.00* 1.90* 1.92* 1.44*  CALCIUM 8.5 8.5 8.8 8.4   Liver Function Tests:  Recent Labs Lab 09/25/13 0923  AST 13  ALT 6  ALKPHOS 67  BILITOT 0.6  PROT 6.4  ALBUMIN 3.0*   No results found for this basename: LIPASE, AMYLASE,  in the last 168 hours No results found for this basename: AMMONIA,  in the last 168 hours CBC:  Recent Labs Lab 09/24/13 1625 09/25/13 0923  WBC 6.6 9.6  HGB 7.7* 11.1*  HCT  25.0* 35.1*  MCV 90.9 90.5  PLT 80* 89*   Cardiac Enzymes: No results found for this basename: CKTOTAL, CKMB, CKMBINDEX, TROPONINI,  in the last 168 hours BNP (last 3 results) No results found for this basename: PROBNP,  in the last 8760 hours CBG:  Recent Labs Lab 09/24/13 1603  GLUCAP 124*    No results found for this or any previous visit (from the past 240 hour(s)).   Studies: Ct Abdomen Pelvis Wo Contrast  09/25/2013   CLINICAL DATA:  Left lower quadrant tenderness, anemia, evaluate for retroperitoneal hemorrhage  EXAM: CT ABDOMEN AND PELVIS WITHOUT CONTRAST  TECHNIQUE: Multidetector CT imaging of the abdomen and pelvis was performed following the standard  protocol without intravenous contrast.  COMPARISON:  01/11/2008  FINDINGS: Coronary atherosclerosis. Left subclavian pacemaker, incompletely visualized.  Moderate right and small left pleural effusions. Associated lower lobe opacities, likely atelectasis. Mild interstitial edema is suspected (series 4/ image 3).  Unenhanced liver, pancreas, and adrenal glands are within normal limits.  Splenomegaly, measuring 20.6 cm in craniocaudal dimension.  Status post cholecystectomy. No intrahepatic or extrahepatic ductal dilatation.  Bilateral renal cortical atrophy with multiple renal cysts, including a 2.7 cm lateral left upper pole renal cyst (series 3/ image 31) and a 2.3 cm medial right lower pole renal cyst (series 3/image 40). No renal calculi or hydronephrosis.  No evidence of bowel obstruction. Extensive colonic diverticulosis, without associated inflammatory changes.  Atherosclerotic calcifications of the abdominal aorta and branch vessels.  Small volume abdominopelvic ascites, likely simple. Perihepatic ascites measures just above simple fluid density, although this likely spuriously elevated due to streak artifact from the patient's arms.  Body wall edema.  No evidence of retroperitoneal hemorrhage.  No suspicious abdominopelvic lymphadenopathy.  Bladder is decompressed by indwelling Foley catheter.  Uterus and bilateral ovaries are poorly evaluated.  Tiny right and small left fat containing inguinal hernias. Associated 2.8 x 3.2 cm fluid collection within the left hernia (series 3/image 88).  Degenerative changes of the visualized thoracolumbar spine. Exaggerated lower thoracic kyphosis.  IMPRESSION: No evidence of retroperitoneal hemorrhage.  Splenomegaly, measuring 20.6 cm in craniocaudal dimension.  Moderate right and small left pleural effusions with possible mild interstitial edema.  Small volume abdominopelvic ascites, simple.  Body wall edema.   Electronically Signed   By: Charline Bills M.D.   On:  09/25/2013 18:14    Scheduled Meds: . digoxin  0.25 mg Intravenous Daily  . metoprolol tartrate  100 mg Oral BID  . pneumococcal 23 valent vaccine  0.5 mL Intramuscular Tomorrow-1000  . psyllium  1 packet Oral Daily  . saccharomyces boulardii  250 mg Oral BID  . sodium chloride  3 mL Intravenous Q12H  . warfarin  2.5 mg Oral ONCE-1800  . Warfarin - Pharmacist Dosing Inpatient   Does not apply q1800   Continuous Infusions: . diltiazem (CARDIZEM) infusion 5 mg/hr (09/27/13 0141)     Marinda Elk  [facility] Pager [phone]. If 8PM-8AM, please contact night-coverage at [email or web address], password [facility] 09/27/2013, 10:24 AM  LOS: 3 days

## 2013-09-27 NOTE — Evaluation (Signed)
Clinical/Bedside Swallow Evaluation Patient Details  Name: [name] MRN: [id number] Date of Birth: 09/17/1924  Today's Date: 09/27/2013 Time: 0925-1005 SLP Time Calculation (min): 40 min  Past Medical History:  Past Medical History  Diagnosis Date  . GERD (gastroesophageal reflux disease)   . Carotid stenosis     a. 92011 Carotid u/s: Right 50-69%, left <50%  . Atrial fibrillation     a. chronic coumadin;  b. 07/2011 Echo: EF 65%, triv MR, mildly dil LA.  . Tachy-brady syndrome     a. 06/2010: SJM Accent RF DR model [id number] (serial number [id number])  . Essential hypertension, benign   . Hypothyroidism     a. Dx 09/2013   Past Surgical History:  Past Surgical History  Procedure Laterality Date  . Total knee arthroplasty      Right  . Laparoscopic cholecystectomy    . Pacemaker insertion  06/29/10    SJM implanted by Dr Johney Frame for tachy/brady syndrome   HPI:  Past medical history of atrial fibrillation and tachybradycardia syndrome status post pacemaker, that went to [facility] because of dyspnea and generalized weakness which has progressively gotten worse. She's she was found to be in atrial fibrillation with RVR was started on diltiazem drip and held no blood pressures in the 90s, also hemoglobin was checked which was 7.5 (previously 10), due to not having cardiology over the weekend and as per [place] request to [place] [facility]. She relates she continues to be weak and mildly short of breath.also some shortness of breath with minimal exertion.   Assessment / Plan / Recommendation Clinical Impression  Pt. did not show overt s/s of aspiration at bedside, however, she did stop chewing frequently, while chewing a small piece of cracker, to take deep breaths through her mouth.  Pt. reports that she "gets strangled" frequently during meals, and that her shortness of breath worsens during meals.  Objective evaluation is recommended to r/o aspiration and determine safest diet  consistency.    Aspiration Risk  Moderate    Diet Recommendation NPO except meds   Medication Administration: Whole meds with puree    Other  Recommendations Recommended Consults: MBS   Follow Up Recommendations       Frequency and Duration        Pertinent Vitals/Pain Afebrile; LS diminished; no recent CXR.    Swallow Study Prior Functional Status       General Date of Onset: 08/28/13 HPI: Past medical history of atrial fibrillation and tachybradycardia syndrome status post pacemaker, that went to [facility] because of dyspnea and generalized weakness which has progressively gotten worse. She's she was found to be in atrial fibrillation with RVR was started on diltiazem drip and held no blood pressures in the 90s, also hemoglobin was checked which was 7.5 (previously 10), due to not having cardiology over the weekend and as per family request to [place] [facility]. She relates she continues to be weak and mildly short of breath.also some shortness of breath with minimal exertion. Type of Study: Bedside swallow evaluation Previous Swallow Assessment: None Diet Prior to this Study: NPO Temperature Spikes Noted: No Respiratory Status: Room air History of Recent Intubation: No Behavior/Cognition: Alert;Cooperative;Pleasant mood;Requires cueing Oral Cavity - Dentition: Missing dentition;Dentures, not available;Poor condition Self-Feeding Abilities: Needs assist Patient Positioning: Upright in chair Baseline Vocal Quality: Clear Volitional Cough: Weak Volitional Swallow: Able to elicit    Oral/Motor/Sensory Function Overall Oral Motor/Sensory Function: Impaired Labial ROM: Within Functional Limits Labial Symmetry: Within Functional Limits Labial  Strength: Within Functional Limits Lingual ROM: Reduced right;Reduced left Lingual Symmetry: Within Functional Limits Lingual Strength: Reduced Facial ROM: Within Functional Limits Facial Symmetry: Within Functional Limits Facial  Strength: Within Functional Limits Mandible: Within Functional Limits   Ice Chips Ice chips: Within functional limits Presentation: Spoon   Thin Liquid Thin Liquid: Within functional limits Presentation: Spoon;Cup;Straw Other Comments: Increased SOB    Nectar Thick Nectar Thick Liquid: Not tested   Honey Thick Honey Thick Liquid: Not tested   Puree Puree: Within functional limits Presentation: Spoon   Solid   GO    Solid: Impaired Oral Phase Impairments: Reduced lingual movement/coordination;Impaired anterior to posterior transit Pharyngeal Phase Impairments:  (Stops chewing to take breaths through mouth frequently.)       [name] T 09/27/2013,10:06 AM

## 2013-09-28 ENCOUNTER — Inpatient Hospital Stay (HOSPITAL_COMMUNITY): Payer: Medicare Other

## 2013-09-28 LAB — TYPE AND SCREEN
ABO/RH(D): A POS
DAT, IgG: NEGATIVE
Unit division: 0
Unit division: 0

## 2013-09-28 LAB — BASIC METABOLIC PANEL
BUN: 62 mg/dL — ABNORMAL HIGH (ref 6–23)
Calcium: 8.6 mg/dL (ref 8.4–10.5)
Chloride: 107 mEq/L (ref 96–112)
Creatinine, Ser: 1.53 mg/dL — ABNORMAL HIGH (ref 0.50–1.10)
GFR calc non Af Amer: 29 mL/min — ABNORMAL LOW (ref >90)
Glucose, Bld: 93 mg/dL (ref 70–99)
Sodium: 140 mEq/L (ref 135–145)

## 2013-09-28 LAB — PROTIME-INR
INR: 2.16 — ABNORMAL HIGH (ref 0.00–1.49)
Prothrombin Time: 23.4 seconds — ABNORMAL HIGH (ref 11.6–15.2)

## 2013-09-28 MED ORDER — FUROSEMIDE 20 MG PO TABS
20.0000 mg | ORAL_TABLET | Freq: Every day | ORAL | Status: DC
  Administered 2013-09-29 – 2013-10-01 (×3): 20 mg via ORAL
  Filled 2013-09-28 (×3): qty 1

## 2013-09-28 MED ORDER — LEVOFLOXACIN IN D5W 750 MG/150ML IV SOLN: 750.0000 mg | Freq: Once | INTRAVENOUS | Status: DC

## 2013-09-28 MED ORDER — ALBUTEROL SULFATE (5 MG/ML) 0.5% IN NEBU
2.5000 mg | INHALATION_SOLUTION | Freq: Four times a day (QID) | RESPIRATORY_TRACT | Status: DC | PRN
  Administered 2013-09-28: 2.5 mg via RESPIRATORY_TRACT
  Filled 2013-09-28 (×2): qty 0.5

## 2013-09-28 MED ORDER — WARFARIN SODIUM 2.5 MG PO TABS
2.5000 mg | ORAL_TABLET | Freq: Once | ORAL | Status: AC
  Administered 2013-09-28: 19:00:00 2.5 mg via ORAL
  Filled 2013-09-28: qty 1

## 2013-09-28 MED ORDER — FUROSEMIDE 10 MG/ML IJ SOLN
INTRAMUSCULAR | Status: AC
  Filled 2013-09-28: qty 4

## 2013-09-28 MED ORDER — CARVEDILOL 6.25 MG PO TABS: 6.2500 mg | ORAL_TABLET | Freq: Two times a day (BID) | ORAL | Status: DC

## 2013-09-28 MED ORDER — LEVOFLOXACIN 500 MG PO TABS: 500.0000 mg | ORAL_TABLET | Freq: Every day | ORAL | Status: DC

## 2013-09-28 MED ORDER — FAMOTIDINE 20 MG PO TABS
20.0000 mg | ORAL_TABLET | Freq: Two times a day (BID) | ORAL | Status: DC
  Administered 2013-09-28 – 2013-09-29 (×4): 20 mg via ORAL
  Filled 2013-09-28 (×6): qty 1

## 2013-09-28 MED ORDER — SODIUM CHLORIDE 0.9 % IV SOLN
INTRAVENOUS | Status: DC | PRN
  Administered 2013-09-28: 1000 mL via INTRAVENOUS

## 2013-09-28 MED ORDER — FUROSEMIDE 10 MG/ML IJ SOLN: 40.0000 mg | Freq: Once | INTRAMUSCULAR | Status: DC

## 2013-09-28 MED ORDER — FUROSEMIDE 10 MG/ML IJ SOLN
20.0000 mg | Freq: Once | INTRAMUSCULAR | Status: AC
  Administered 2013-09-28: 13:00:00 20 mg via INTRAVENOUS

## 2013-09-28 NOTE — Progress Notes (Addendum)
[facility] PROGRESS NOTE Interim History: 79 y.o. female  Past medical history of atrial fibrillation and tachybradycardia syndrome status post pacemaker, that went to [facility] because of dyspnea and generalized weakness which has progressively gotten worse. She's she was found to be in atrial fibrillation with RVR was started on diltiazem drip and held no blood pressures in the 90s, also hemoglobin was checked which was 7.5 (previously 10), due to not having cardiology over the weekend and as per [facility]  Assessment/Plan: Atrial fibrillation with RVR - D/c coreg, cont metoprolol 100 mg BID, use metoprolol IV PRN. - Appreciate Cardiology assistance Dr. Ladona Ridgel. Cont coumadin. - Baseline Cr < 1.0;Cr. 2.0 ->1.7-> 1.4. Check a b-met in am. - home in am if cont to be rate controlled. - Repeated CXR compatible with small pleural effusion, she relates no SOB. Afebrile and leukocytosis, repeat CBC. Will hold on antibiotics as clinically she is improving.   AKI (acute kidney injury) - Most likely due to over diuresis and diarrhea.  - Cr. Is improving.  - resume home dose lasix.  Acute confusional State/Delirium: - AVOID ativan. - Use haldol PRN. - Most likely due to ativan and AKI. -Now resolved.  Diarrhea: - Recently started on PPI. Will go ahead and hold it. Start famotidine. - Negative C.dif, pro-biotics and metamucil. - Avoid sorbitol sweeteners.  Normocytic Anemia: - FOBT x1 negative. CT abdomen and pelvis negative for bleed. - S/p 2 units  PRBC 12.12. &12.13.2014  Hypothyroidism: - cont synthroid.  Essential hypertension, benign - Bp improved.   Code Status: full  Family Communication: daughter and husband  Disposition Plan: inpatient    Consultants:  cardiology  Procedures:  none  Antibiotics:  None  HPI/Subjective: Sleepy relates she did not have a good night sleep. Awake more cheer full than the day before. - No sob was  able to get up from bed to chair with minimal assistance. Subjective: Filed Vitals:   09/28/13 0513 09/28/13 0748 09/28/13 0755 09/28/13 1123  BP: 124/74   140/78  Pulse: 75   88  Temp: 97.3 F (36.3 C)     TempSrc: Oral     Resp: 18 18 18 18   Height:      Weight: 60.2 kg (132 lb 11.5 oz)     SpO2: 95% 99% 94% 94%    Intake/Output Summary (Last 24 hours) at 09/28/13 1210 Last data filed at 09/28/13 1159  Gross per 24 hour  Intake 573.09 ml  Output    502 ml  Net  71.09 ml   Filed Weights   09/26/13 0627 09/27/13 0351 09/28/13 0513  Weight: 66.3 kg (146 lb 2.6 oz) 66.5 kg (146 lb 9.7 oz) 60.2 kg (132 lb 11.5 oz)    Exam:  General: Alert, awake, oriented x3, in no acute distress.  HEENT: No bruits, no goiter. - JVD Heart: Regular rate and rhythm, without murmurs, rubs, gallops.  Lungs: Good air movement, clear to auscultation. Abdomen: Soft, nontender, nondistended, positive bowel sounds.   Data Reviewed: Basic Metabolic Panel:  Recent Labs Lab 09/24/13 1625 09/25/13 0923 09/26/13 1215 09/27/13 0359 09/28/13 0530  NA 136 137 138 137 140  K 4.7 4.9 5.1 3.6 4.8  CL 104 104 104 97 107  CO2 22 22 22 30 20   GLUCOSE 119* 108* 103* 102* 93  BUN 53* 53* 59* 52* 62*  CREATININE 2.00* 1.90* 1.92* 1.44* 1.53*  CALCIUM 8.5 8.5 8.8 8.4 8.6   Liver Function Tests:  Recent Labs Lab 09/25/13 0923  AST 13  ALT 6  ALKPHOS 67  BILITOT 0.6  PROT 6.4  ALBUMIN 3.0*   No results found for this basename: LIPASE, AMYLASE,  in the last 168 hours No results found for this basename: AMMONIA,  in the last 168 hours CBC:  Recent Labs Lab 09/24/13 1625 09/25/13 0923  WBC 6.6 9.6  HGB 7.7* 11.1*  HCT 25.0* 35.1*  MCV 90.9 90.5  PLT 80* 89*   Cardiac Enzymes: No results found for this basename: CKTOTAL, CKMB, CKMBINDEX, TROPONINI,  in the last 168 hours BNP (last 3 results) No results found for this basename: PROBNP,  in the last 8760 hours CBG:  Recent Labs Lab  09/24/13 1603  GLUCAP 124*    Recent Results (from the past 240 hour(s))  CLOSTRIDIUM DIFFICILE BY PCR     Status: None   Collection Time    09/27/13  1:54 AM      Result Value Range Status   C difficile by pcr NEGATIVE  NEGATIVE Final     Studies: Dg Chest 1 View  09/27/2013   CLINICAL DATA:  Aspiration  EXAM: CHEST - 1 VIEW  COMPARISON:  09/23/2013.  FINDINGS: Progressive consolidation of the mid to lower lung zones. This may be partially explained by posteriorly layering pleural fluid. Basilar atelectasis/ infiltrates suspected. Pulmonary edema may contribute to this appearance. No gross pneumothorax.  Sequential pacemaker is in place entering from the left. Leads unchanged in position.  Calcified aorta.  IMPRESSION: Progressive consolidation of the mid to lower lung zones. This may be partially explained by posteriorly layering pleural fluid.  Basilar atelectasis/ infiltrates suspected.  Pulmonary edema may contribute to this appearance.   Electronically Signed   By: Bridgett Larsson M.D.   On: 09/27/2013 14:24   Dg Swallowing Func-speech Pathology  09/27/2013   Lenor Derrick, CCC-SLP     09/27/2013  1:45 PM Objective Swallowing Evaluation:    Patient Details  Name: [name] MRN: [id number] Date of Birth: June 16, 1924  Today's Date: 09/27/2013 Time: 1300-1335 SLP Time Calculation (min): 35 min  Past Medical History:  Past Medical History  Diagnosis Date  . GERD (gastroesophageal reflux disease)   . Carotid stenosis     a. 92011 Carotid u/s: Right 50-69%, left <50%  . Atrial fibrillation     a. chronic coumadin;  b. 07/2011 Echo: EF 65%, triv MR, mildly  dil LA.  . Tachy-brady syndrome     a. 06/2010: SJM [employer] RF DR model WUJ811 (serial number  [id number])  . Essential hypertension, benign   . Hypothyroidism     a. Dx 09/2013   Past Surgical History:  Past Surgical History  Procedure Laterality Date  . Total knee arthroplasty      Right  . Laparoscopic cholecystectomy    . Pacemaker insertion   06/29/10    SJM implanted by Dr Johney Frame for tachy/brady syndrome   HPI:  Past medical history of atrial fibrillation and tachybradycardia  syndrome status post pacemaker, that went to [facility] because of  dyspnea and generalized weakness which has progressively gotten  worse. She's she was found to be in atrial fibrillation with RVR  was started on diltiazem drip and held no blood pressures in the  90s, also hemoglobin was checked which was 7.5 (previously 10),  due to not having cardiology over the weekend and as per [facility]  [facility]. She relates she continues to be weak  and  mildly short of breath.also some shortness of breath with  minimal exertion.     Assessment / Plan / Recommendation Clinical Impression  Dysphagia Diagnosis: Mild pharyngeal phase dysphagia Clinical impression: Pt. had only a mild pharyngeal dysphagia  characterized by decreased tongue base contraction to the  pharyngeal wall and decreased pharyngeal perestalsis, resulting  residue of mechanical substance in the valleculae, which cleared  with a repeat dry swallow.  The pill lodged in the valleculae and  would not clear with liquids, but did clear with a bite of puree.   The pill also briefly lodged in the distal esophagus, but  cleared with sip of thin liquid.  There was no aspiration or  penetration with any consistency, even with consecutive large  swallows of thin via straw.    Treatment Recommendation  No treatment recommended at this time    Diet Recommendation Dysphagia 3 (Mechanical Soft);Thin liquid   Liquid Administration via: Straw Medication Administration: Whole meds with puree Supervision: Patient able to self feed;Staff to assist with self  feeding;Full supervision/cueing for compensatory strategies Compensations: Slow rate;Small sips/bites;Follow solids with  liquid Postural Changes and/or Swallow Maneuvers: Seated upright 90  degrees    Other  Recommendations Oral Care Recommendations: Oral care   BID;Staff/trained caregiver to provide oral care Other Recommendations: Clarify dietary restrictions   Follow Up Recommendations  24 hour supervision/assistance    Frequency and Duration        Pertinent Vitals/Pain n/a    SLP Swallow Goals  n/a   General HPI: Past medical history of atrial fibrillation and  tachybradycardia syndrome status post pacemaker, that went to  [facility] because of dyspnea and generalized weakness which has  progressively gotten worse. She's she was found to be in atrial  fibrillation with RVR was started on diltiazem drip and held no  blood pressures in the 90s, also hemoglobin was checked which was  7.5 (previously 10), due to not having cardiology over the  weekend and as per [facility] to [facility]. She  relates she continues to be weak and mildly short of breath.also  some shortness of breath with minimal exertion. Reason for Referral: Objectively evaluate swallowing function  (r/o silent aspiration) Previous Swallow Assessment: BSE no overt s/s of aspiration, but  pt. reports choking episodes at times and increased respiratory  effort during meals..  Objective eval to r/o silent aspiration. Diet Prior to this Study: NPO Temperature Spikes Noted: No Respiratory Status: Room air History of Recent Intubation: No Behavior/Cognition: Alert;Cooperative;Pleasant mood;Requires  cueing Oral Cavity - Dentition: Missing dentition;Dentures, not  available;Poor condition Oral Motor / Sensory Function: Within functional limits Self-Feeding Abilities: Needs assist Patient Positioning: Upright in chair Baseline Vocal Quality: Clear Volitional Cough: Weak Volitional Swallow: Able to elicit Anatomy: Within functional limits Pharyngeal Secretions: Not observed secondary MBS    Reason for Referral Objectively evaluate swallowing function (r/o  silent aspiration)   Oral Phase Oral Preparation/Oral Phase Oral Phase: WFL   Pharyngeal Phase Pharyngeal Phase Pharyngeal Phase: Impaired Pharyngeal -  Solids Pharyngeal - Regular: Pharyngeal residue - valleculae Pharyngeal - Pill: Pharyngeal residue - valleculae  Cervical Esophageal Phase    GO    Cervical Esophageal Phase Cervical Esophageal Phase: WFL (Pill lodged in distal esophagus,  but cleared with next swall)         Willis, Lori T 09/27/2013, 1:44 PM     Scheduled Meds: . metoprolol tartrate  100 mg Oral BID  . pneumococcal 23 valent vaccine  0.5 mL  Intramuscular Tomorrow-1000  . psyllium  1 packet Oral Daily  . saccharomyces boulardii  250 mg Oral BID  . sodium chloride  3 mL Intravenous Q12H  . warfarin  2.5 mg Oral ONCE-1800  . Warfarin - Pharmacist Dosing Inpatient   Does not apply q1800   Continuous Infusions:     Marinda Elk  [facility] Pager [phone]. If 8PM-8AM, please contact night-coverage at [email or web address], password [facility] 09/28/2013, 12:10 PM  LOS: 4 days

## 2013-09-28 NOTE — Clinical Social Work Placement (Addendum)
    Clinical Social Work Department CLINICAL SOCIAL WORK PLACEMENT NOTE 10/07/2013  Patient:  [name], [name]  Account Number:  [id number] Admit date:  09/24/2013  Clinical Social Worker:  Lupita Leash Ranell Finelli, LCSWA  Date/time:  09/28/2013 02:15 PM  Clinical Social Work is seeking post-discharge placement for this patient at the following level of care:   SKILLED NURSING   (*CSW will update this form in Epic as items are completed)   09/28/2013  Patient/family provided with [facility] Department of Clinical Social Work's list of facilities offering this level of care within the geographic area requested by the patient (or if unable, by the patient's family).  09/28/2013  Patient/family informed of their freedom to choose among providers that offer the needed level of care, that participate in Medicare, Medicaid or managed care program needed by the patient, have an available bed and are willing to accept the patient.  09/28/2013  Patient/family informed of [facility]' ownership interest in [facility], as well as of the fact that they are under no obligation to receive care at this facility.  PASARR submitted to EDS on 09/28/2013 PASARR number received from EDS on 09/28/2013  FL2 transmitted to all facilities in geographic area requested by pt/family on  09/28/2013 FL2 transmitted to all facilities within larger geographic area on   Patient informed that his/her managed care company has contracts with or will negotiate with  certain facilities, including the following:   [employer]- auth request initiated 09/28/13     Patient/family informed of bed offers received:  09/30/2013 Patient chooses bed at [facility] Physician recommends and patient chooses bed at    Patient to be transferred to [facility] on  10/01/2013 Patient to be transferred to facility by Ambulance  Sharin Mons)  The following physician request were entered in Epic:   Additional  Comments: 10/01/13  DC to SNF today. [employer] Berkley Harvey is in place. OK per patient, family, SNF and patient's nurse who will call report. No futher CSW needs identified. CSW signing off.  Lorri Frederick. Marv Alfrey, LCSWA  [phone]

## 2013-09-28 NOTE — Progress Notes (Addendum)
SUBJECTIVE: Still with dyspnea on exertion and fatigue.  Worked with PT yesterday and was able to ambulate to door.   Device interrogation demonstrates new onset afib beginning 09-24-2013. For the 9 months prior to that has been in SR.   Labs pending this morning.   CURRENT MEDICATIONS: . digoxin  0.25 mg Intravenous Daily  . metoprolol tartrate  100 mg Oral BID  . pneumococcal 23 valent vaccine  0.5 mL Intramuscular Tomorrow-1000  . psyllium  1 packet Oral Daily  . saccharomyces boulardii  250 mg Oral BID  . sodium chloride  3 mL Intravenous Q12H  . Warfarin - Pharmacist Dosing Inpatient   Does not apply q1800   . diltiazem (CARDIZEM) infusion 5 mg/hr (09/27/13 0141)    OBJECTIVE: Physical Exam: Filed Vitals:   09/27/13 1845 09/27/13 2113 09/28/13 0208 09/28/13 0513  BP: 148/69 139/82 148/55 124/74  Pulse: 84 80 73 75  Temp: 97.1 F (36.2 C) 97.4 F (36.3 C) 97.1 F (36.2 C) 97.3 F (36.3 C)  TempSrc: Oral Oral Oral Oral  Resp: 18 18 18 18   Height:      Weight:      SpO2: 93% 94% 92% 95%    Intake/Output Summary (Last 24 hours) at 09/28/13 0606 Last data filed at 09/28/13 0300  Gross per 24 hour  Intake 306.42 ml  Output    302 ml  Net   4.42 ml    Telemetry reveals atrial fibrillation, ventricular rates   GEN- The patient is well appearing, alert and oriented x 3 today.   Head- normocephalic, atraumatic Eyes-  Sclera clear, conjunctiva pink Ears- hearing intact Oropharynx- clear Neck- supple, no JVP Lymph- no cervical lymphadenopathy Lungs- Clear to ausculation bilaterally, normal work of breathing Heart- Regular rate and rhythm, no murmurs, rubs or gallops, PMI not laterally displaced GI- soft, NT, ND, + BS Extremities- no clubbing, cyanosis, or edema Skin- no rash or lesion Psych- euthymic mood, full affect Neuro- strength and sensation are intact  LABS: Basic Metabolic Panel:  Recent Labs  16/10/96 1215 09/27/13 0359  NA 138 137  K 5.1  3.6  CL 104 97  CO2 22 30  GLUCOSE 103* 102*  BUN 59* 52*  CREATININE 1.92* 1.44*  CALCIUM 8.8 8.4   Liver Function Tests:  Recent Labs  09/25/13 0923  AST 13  ALT 6  ALKPHOS 67  BILITOT 0.6  PROT 6.4  ALBUMIN 3.0*   CBC:  Recent Labs  09/25/13 0923  WBC 9.6  HGB 11.1*  HCT 35.1*  MCV 90.5  PLT 89*    RADIOLOGY: Ct Abdomen Pelvis Wo Contrast 09/25/2013   CLINICAL DATA:  Left lower quadrant tenderness, anemia, evaluate for retroperitoneal hemorrhage  EXAM: CT ABDOMEN AND PELVIS WITHOUT CONTRAST  TECHNIQUE: Multidetector CT imaging of the abdomen and pelvis was performed following the standard protocol without intravenous contrast.  COMPARISON:  01/11/2008  FINDINGS: Coronary atherosclerosis. Left subclavian pacemaker, incompletely visualized.  Moderate right and small left pleural effusions. Associated lower lobe opacities, likely atelectasis. Mild interstitial edema is suspected (series 4/ image 3).  Unenhanced liver, pancreas, and adrenal glands are within normal limits.  Splenomegaly, measuring 20.6 cm in craniocaudal dimension.  Status post cholecystectomy. No intrahepatic or extrahepatic ductal dilatation.  Bilateral renal cortical atrophy with multiple renal cysts, including a 2.7 cm lateral left upper pole renal cyst (series 3/ image 31) and a 2.3 cm medial right lower pole renal cyst (series 3/image 40). No renal calculi or hydronephrosis.  No evidence of bowel obstruction. Extensive colonic diverticulosis, without associated inflammatory changes.  Atherosclerotic calcifications of the abdominal aorta and branch vessels.  Small volume abdominopelvic ascites, likely simple. Perihepatic ascites measures just above simple fluid density, although this likely spuriously elevated due to streak artifact from the patient's arms.  Body wall edema.  No evidence of retroperitoneal hemorrhage.  No suspicious abdominopelvic lymphadenopathy.  Bladder is decompressed by indwelling Foley  catheter.  Uterus and bilateral ovaries are poorly evaluated.  Tiny right and small left fat containing inguinal hernias. Associated 2.8 x 3.2 cm fluid collection within the left hernia (series 3/image 88).  Degenerative changes of the visualized thoracolumbar spine. Exaggerated lower thoracic kyphosis.  IMPRESSION: No evidence of retroperitoneal hemorrhage.  Splenomegaly, measuring 20.6 cm in craniocaudal dimension.  Moderate right and small left pleural effusions with possible mild interstitial edema.  Small volume abdominopelvic ascites, simple.  Body wall edema.   Electronically Signed   By: Charline Bills M.D.   On: 09/25/2013 18:14   Dg Chest 1 View 09/27/2013   CLINICAL DATA:  Aspiration  EXAM: CHEST - 1 VIEW  COMPARISON:  09/23/2013.  FINDINGS: Progressive consolidation of the mid to lower lung zones. This may be partially explained by posteriorly layering pleural fluid. Basilar atelectasis/ infiltrates suspected. Pulmonary edema may contribute to this appearance. No gross pneumothorax.  Sequential pacemaker is in place entering from the left. Leads unchanged in position.  Calcified aorta.  IMPRESSION: Progressive consolidation of the mid to lower lung zones. This may be partially explained by posteriorly layering pleural fluid.  Basilar atelectasis/ infiltrates suspected.  Pulmonary edema may contribute to this appearance.   Electronically Signed   By: Bridgett Larsson M.D.   On: 09/27/2013 14:24    ASSESSMENT AND PLAN:  Principal Problem:   Atrial fibrillation with RVR Active Problems:   BRADYCARDIA-TACHYCARDIA SYNDROME   CAROTID ARTERY STENOSIS   PACEMAKER-[employer]   Essential hypertension, benign   Anemia   Dyspnea   Hypothyroidism   AKI (acute kidney injury)   Acute confusional state  Rec: As patient's ventricular rate is much better controlled and kidney function improving, I would suggest continue of current meds except will stop digoxin and IV cardizem, advance activity as tolerated.  I would not at this point suggest DCCV. Only medication which might be appropriate for maintenance of NSR is amiodarone and I do not think she would tolerate this. Would reconsider this treatment option as an outpatient or if rate cannot be controlled. Will recheck cxr. It is difficult to know her volume status.  Leonia Reeves.D.

## 2013-09-28 NOTE — Care Management Note (Addendum)
  Page 1 of 1   09/28/2013     10:20:57 AM   CARE MANAGEMENT NOTE 09/28/2013  Patient:  [name], RHUDE   Account Number:  [id number]  Date Initiated:  09/28/2013  Documentation initiated by:  [facility]  Subjective/Objective Assessment:   79 y.o. female Hx of atrial fibrillation and tachybradycardia syndrome status post pacemaker, went to [facility] because of dyspnea and generalized weakness which has progressively gotten worse. Home with spouse     Action/Plan:   Diltiazem drip//Home with self care   Anticipated DC Date:  10/01/2013   Anticipated DC Plan:        DC Planning Services  CM consult      Choice offered to / List presented to:             Status of service:   Medicare Important Message given?   (If response is "NO", the following Medicare IM given date fields will be blank) Date Medicare IM given:   Date Additional Medicare IM given:    Discharge Disposition:    Per UR Regulation:    If discussed at Long Length of Stay Meetings, dates discussed:    Comments:  09/28/13 1015 Oletta Cohn, RN, BSN, [place] [phone]   Contact info:   Name                     Relation Home                           Work Mobile   Hodges,Kim           Daughter [phone]   Dianelys, [place]          Spouse [phone]

## 2013-09-28 NOTE — Progress Notes (Signed)
ANTICOAGULATION CONSULT NOTE - Follow Up Consult  Pharmacy Consult for Coumadin Indication: atrial fibrillation  Allergies  Allergen Reactions  . Penicillins Rash    Patient Measurements: Height: 4\' 10"  (147.3 cm) Weight: 132 lb 11.5 oz (60.2 kg) (scale b) IBW/kg (Calculated) : 40.9  Vital Signs: Temp: 97.3 F (36.3 C) (12/16 0513) Temp src: Oral (12/16 0513) BP: 124/74 mmHg (12/16 0513) Pulse Rate: 75 (12/16 0513)  Labs:  Recent Labs  09/26/13 1215 09/26/13 1540 09/27/13 0359 09/28/13 0530  LABPROT  --  20.4* 20.9* 23.4*  INR  --  1.80* 1.86* 2.16*  CREATININE 1.92*  --  1.44* 1.53*    Estimated Creatinine Clearance: 19.9 ml/min (by C-G formula based on Cr of 1.53).   Medications:  Scheduled:  . metoprolol tartrate  100 mg Oral BID  . pneumococcal 23 valent vaccine  0.5 mL Intramuscular Tomorrow-1000  . psyllium  1 packet Oral Daily  . saccharomyces boulardii  250 mg Oral BID  . sodium chloride  3 mL Intravenous Q12H  . warfarin  2.5 mg Oral ONCE-1800  . Warfarin - Pharmacist Dosing Inpatient   Does not apply q1800    Assessment: 50 yoF with PMH afib and tachybradycardia syndrome s/p PM, that went to [facility] because of dyspnea and generalized weakness which has progressed.   INR 4 at [facility] FOB(-) 12/12, To CT abd/pelvis (-) RP bleed. Hg 11.1- after transfusion. Low Hg driving AFib. No hematemesis, melanotic stools.   Coumadin PTA 2.5 mg MWFSun, 5mg  TTSat  INR 12/15 1.86 INR 12/16 2.16  Goal of Therapy:  INR 2-3 Monitor platelets by anticoagulation protocol: Yes   Plan:  Coumadin 2.5mg  x 1 tonight Daily INR, monitor for signs of bleeding  Nyoka Alcoser B. Artelia Laroche, PharmD Clinical Pharmacist - Resident Pager: [phone] Phone: [phone] 09/28/2013 11:18 AM

## 2013-09-28 NOTE — Progress Notes (Signed)
Physical Therapy Treatment Patient Details Name: [name] MRN: [id number] DOB: [birth year] Today's Date: 09/28/2013 Time: 4098-1191 PT Time Calculation (min): 29 min  PT Assessment / Plan / Recommendation  History of Present Illness patient admitted with Afib, diarrhea and anemia.  Workup still in progress.   PT Comments   Patient tolerated treatment well today despite being very drowsy at start of session. Spouse and daughter present at end of session and husband reports not being able to sufficiently care for her at the home and requested placement for her where she receive the care she currently requires. Will continue to follow acutely and monitor progression toward her goals.  Follow Up Recommendations  SNF;Supervision/Assistance - 24 hour     Does the patient have the potential to tolerate intense rehabilitation     Barriers to Discharge        Equipment Recommendations       Recommendations for Other Services    Frequency Min 3X/week   Progress towards PT Goals Progress towards PT goals: Progressing toward goals  Plan Current plan remains appropriate    Precautions / Restrictions Precautions Precautions: Fall Restrictions Weight Bearing Restrictions: No   Pertinent Vitals/Pain SpO2 on RA 98% at rest; 90-98% on RA with activity; no pain reported;    Mobility  Bed Mobility Bed Mobility: Rolling Left;Left Sidelying to Sit;Sitting - Scoot to [place] of Bed Rolling Left: 4: Min assist Left Sidelying to Sit: 4: Min assist Sitting - Scoot to [place] of Bed: 4: Min assist Details for Bed Mobility Assistance: needed cues to initiate movement, assist to place hands and to scoot hips forward; Transfers Transfers: Sit to Stand;Stand to Sit Sit to Stand: 4: Min assist;From bed;With upper extremity assist Stand to Sit: 4: Min assist;To chair/3-in-1;With upper extremity assist Details for Transfer Assistance: needed cues for hand placement, assist for safety,   Ambulation/Gait Ambulation/Gait Assistance: 4: Min assist Ambulation Distance (Feet): 50 Feet Assistive device: Rolling walker Ambulation/Gait Assistance Details: cues for posture, to stay inside RW and for safer hand placement on walker, some assistance needed in controlling RW Gait Pattern: Step-through pattern;Decreased stride length;Trunk flexed Gait velocity: decreased General Gait Details: pt stands and walks with extreme hyperkyphosis; cues to extend trunk at the hips as much as is possible Stairs: No    Exercises General Exercises - Lower Extremity Long Arc Quad: AROM;Both;15 reps;Seated Hip ABduction/ADduction: AROM;Both;15 reps;Seated Hip Flexion/Marching: AROM;Both;15 reps;Seated Toe Raises: AROM;Both;15 reps;Seated Heel Raises: AROM;Both;15 reps;Seated   PT Diagnosis:    PT Problem List:   PT Treatment Interventions:     PT Goals (current goals can now be found in the care plan section)    Visit Information  Last PT Received On: 09/28/13 Assistance Needed: +1 History of Present Illness: patient admitted with Afib, diarrhea and anemia.  Workup still in progress.    Subjective Data      Cognition  Cognition Arousal/Alertness: Lethargic Behavior During Therapy: WFL for tasks assessed/performed Overall Cognitive Status: Within Functional Limits for tasks assessed    Balance     End of Session PT - End of Session Equipment Utilized During Treatment: Gait belt Activity Tolerance: Patient tolerated treatment well Patient left: in chair;with call bell/phone within reach;with family/visitor present Nurse Communication: Mobility status   GP     Willette Pa, SPT 09/28/2013, 10:18 AM

## 2013-09-28 NOTE — Progress Notes (Signed)
Pt in chair eating breakfast, Pt o3x,  No complaints of sob or CP,. IV dressing changed d/t leak. Pt on 3l of O2 night RN unaware of why, states she has been on RA. Move Pt to 1L o2 stat 94%. Will continue to monitor

## 2013-09-28 NOTE — Progress Notes (Signed)
Seen and agree with SPT note with below addition  Pt has been progressively getting weaker for last 3wks. Was sponge bathing independently in standing and dressing herself and is unable to perform those activities at this time and spouse unable to provide care for pt and ST-SNF recommended for strengthening and mobility to decrease burden of care.  Delaney Meigs, PT [phone]

## 2013-09-28 NOTE — Progress Notes (Signed)
Utilization Review Completed Makye Radle J. Veleria Barnhardt, RN, BSN, NCM [phone]  

## 2013-09-28 NOTE — Clinical Social Work Psychosocial (Signed)
     Clinical Social Work Department BRIEF PSYCHOSOCIAL ASSESSMENT 09/28/2013  Patient:  [name], [name]     Account Number:  [id number]     Admit date:  09/24/2013  Clinical Social Worker:  Tiburcio Pea  Date/Time:  09/28/2013 02:06 PM  Referred by:  Physician  Date Referred:  09/28/2013 Referred for  SNF Placement   Other Referral:   Interview type:  Other - See comment Other interview type:   Patient, Husband, daughter Tally Joe    PSYCHOSOCIAL DATA Living Status:  HUSBAND Admitted from facility:   Level of care:   Primary support name:  Geoge "Junior" Osburn  ([id number] Primary support relationship to patient:  SPOUSE Degree of support available:   Strong support    DaughterTally Joe  [phone]    CURRENT CONCERNS Current Concerns  Post-Acute Placement   Other Concerns:    SOCIAL WORK ASSESSMENT / PLAN 79 year old female- admitted from home where she lives with her husband.  Per Physical Therapy- they recommend short term SNF for patient prior to returning home. Husband is strongly requesting [facility] as is close to where they live and he feels that patient will be more accepting of placement.  Bed search process discussed and initiated. Awaiting Bed offers. Fl2 placed on chart for MD's signature.   Assessment/plan status:  Psychosocial Support/Ongoing Assessment of Needs Other assessment/ plan:   Information/referral to community resources:   SNF bed list provided  [facility] auth requested.    PATIENTS/FAMILYS RESPONSE TO PLAN OF CARE: Patient is alert and oriented; very pleasant lady. She wants to return homje but husband cannot manage her care at this time.  He strongly supports SNF placement as does their daughter Selena Batten.  CSW will provide support  and assist with placement of patient.  They are appreciative of CSW's visit and assistance.  Lorri Frederick. Baker Kogler, LCSWA  [phone]

## 2013-09-29 DIAGNOSIS — R1319 Other dysphagia: Secondary | ICD-10-CM

## 2013-09-29 DIAGNOSIS — J690 Pneumonitis due to inhalation of food and vomit: Secondary | ICD-10-CM | POA: Diagnosis present

## 2013-09-29 LAB — BASIC METABOLIC PANEL
BUN: 59 mg/dL — ABNORMAL HIGH (ref 6–23)
Chloride: 108 mEq/L (ref 96–112)
GFR calc Af Amer: 37 mL/min — ABNORMAL LOW (ref >90)
GFR calc non Af Amer: 32 mL/min — ABNORMAL LOW (ref >90)
Glucose, Bld: 100 mg/dL — ABNORMAL HIGH (ref 70–99)
Potassium: 4.5 mEq/L (ref 3.5–5.1)
Sodium: 141 mEq/L (ref 135–145)

## 2013-09-29 LAB — CBC
HCT: 37.9 % (ref 36.0–46.0)
MCHC: 30.9 g/dL (ref 30.0–36.0)
MCV: 92.4 fL (ref 78.0–100.0)
Platelets: 68 10*3/uL — ABNORMAL LOW (ref 150–400)
RDW: 16.6 % — ABNORMAL HIGH (ref 11.5–15.5)
WBC: 13.1 10*3/uL — ABNORMAL HIGH (ref 4.0–10.5)

## 2013-09-29 MED ORDER — WARFARIN SODIUM 2.5 MG PO TABS
2.5000 mg | ORAL_TABLET | Freq: Once | ORAL | Status: AC
  Administered 2013-09-29: 2.5 mg via ORAL
  Filled 2013-09-29: qty 1

## 2013-09-29 MED ORDER — LEVOFLOXACIN 750 MG PO TABS
750.0000 mg | ORAL_TABLET | ORAL | Status: DC
Start: 2013-09-29 — End: 2013-10-01
  Administered 2013-09-29: 750 mg via ORAL
  Filled 2013-09-29 (×2): qty 1

## 2013-09-29 NOTE — Progress Notes (Signed)
SUBJECTIVE: Patient feels improved today - shortness of breath better.   CURRENT MEDICATIONS: . famotidine  20 mg Oral BID  . furosemide  20 mg Oral Daily  . metoprolol tartrate  100 mg Oral BID  . pneumococcal 23 valent vaccine  0.5 mL Intramuscular Tomorrow-1000  . psyllium  1 packet Oral Daily  . saccharomyces boulardii  250 mg Oral BID  . sodium chloride  3 mL Intravenous Q12H  . Warfarin - Pharmacist Dosing Inpatient   Does not apply q1800      OBJECTIVE: Physical Exam: Filed Vitals:   09/28/13 1405 09/28/13 1815 09/28/13 2017 09/29/13 0258  BP: 128/66 121/72 133/71 140/96  Pulse: 61 89 102 92  Temp: 97.9 F (36.6 C) 98.2 F (36.8 C) 98.4 F (36.9 C) 98.9 F (37.2 C)  TempSrc: Oral Oral Oral Oral  Resp: 19 18 22 20   Height:      Weight:      SpO2: 94% 92% 93% 96%    Intake/Output Summary (Last 24 hours) at 09/29/13 1610 Last data filed at 09/29/13 0050  Gross per 24 hour  Intake 745.67 ml  Output    800 ml  Net -54.33 ml    Telemetry reveals atrial fibrillation, ventricular rates controlled  GEN- The patient is well appearing, alert and oriented x 3 today.   Head- normocephalic, atraumatic Eyes-  Sclera clear, conjunctiva pink Ears- hearing intact Neck- supple, no JVP Lungs- Clear to ausculation bilaterally, normal work of breathing Heart- Regular rate and rhythm, no murmurs, rubs or gallops, PMI not laterally displaced GI- soft, NT, ND, + BS Extremities- no clubbing, cyanosis, or edema Skin- no rash or lesion Psych- euthymic mood, full affect Neuro- strength and sensation are intact  LABS: Basic Metabolic Panel:  Recent Labs  96/04/54 0530 09/29/13 0415  NA 140 141  K 4.8 4.5  CL 107 108  CO2 20 22  GLUCOSE 93 100*  BUN 62* 59*  CREATININE 1.53* 1.42*  CALCIUM 8.6 8.7    RADIOLOGY: Ct Abdomen Pelvis Wo Contrast 09/25/2013   CLINICAL DATA:  Left lower quadrant tenderness, anemia, evaluate for retroperitoneal hemorrhage  EXAM: CT  ABDOMEN AND PELVIS WITHOUT CONTRAST  TECHNIQUE: Multidetector CT imaging of the abdomen and pelvis was performed following the standard protocol without intravenous contrast.  COMPARISON:  01/11/2008  FINDINGS: Coronary atherosclerosis. Left subclavian pacemaker, incompletely visualized.  Moderate right and small left pleural effusions. Associated lower lobe opacities, likely atelectasis. Mild interstitial edema is suspected (series 4/ image 3).  Unenhanced liver, pancreas, and adrenal glands are within normal limits.  Splenomegaly, measuring 20.6 cm in craniocaudal dimension.  Status post cholecystectomy. No intrahepatic or extrahepatic ductal dilatation.  Bilateral renal cortical atrophy with multiple renal cysts, including a 2.7 cm lateral left upper pole renal cyst (series 3/ image 31) and a 2.3 cm medial right lower pole renal cyst (series 3/image 40). No renal calculi or hydronephrosis.  No evidence of bowel obstruction. Extensive colonic diverticulosis, without associated inflammatory changes.  Atherosclerotic calcifications of the abdominal aorta and branch vessels.  Small volume abdominopelvic ascites, likely simple. Perihepatic ascites measures just above simple fluid density, although this likely spuriously elevated due to streak artifact from the patient's arms.  Body wall edema.  No evidence of retroperitoneal hemorrhage.  No suspicious abdominopelvic lymphadenopathy.  Bladder is decompressed by indwelling Foley catheter.  Uterus and bilateral ovaries are poorly evaluated.  Tiny right and small left fat containing inguinal hernias. Associated 2.8 x 3.2 cm fluid collection  within the left hernia (series 3/image 88).  Degenerative changes of the visualized thoracolumbar spine. Exaggerated lower thoracic kyphosis.  IMPRESSION: No evidence of retroperitoneal hemorrhage.  Splenomegaly, measuring 20.6 cm in craniocaudal dimension.  Moderate right and small left pleural effusions with possible mild  interstitial edema.  Small volume abdominopelvic ascites, simple.  Body wall edema.   Electronically Signed   By: Charline Bills M.D.   On: 09/25/2013 18:14   Dg Chest 1 View 09/27/2013   CLINICAL DATA:  Aspiration  EXAM: CHEST - 1 VIEW  COMPARISON:  09/23/2013.  FINDINGS: Progressive consolidation of the mid to lower lung zones. This may be partially explained by posteriorly layering pleural fluid. Basilar atelectasis/ infiltrates suspected. Pulmonary edema may contribute to this appearance. No gross pneumothorax.  Sequential pacemaker is in place entering from the left. Leads unchanged in position.  Calcified aorta.  IMPRESSION: Progressive consolidation of the mid to lower lung zones. This may be partially explained by posteriorly layering pleural fluid.  Basilar atelectasis/ infiltrates suspected.  Pulmonary edema may contribute to this appearance.   Electronically Signed   By: Bridgett Larsson M.D.   On: 09/27/2013 14:24    ASSESSMENT AND PLAN:  Principal Problem:   Atrial fibrillation with RVR Active Problems:   BRADYCARDIA-TACHYCARDIA SYNDROME   CAROTID ARTERY STENOSIS   PACEMAKER-[employer]   Essential hypertension, benign   Anemia   Dyspnea   Hypothyroidism   AKI (acute kidney injury)   Acute confusional state  Rec: As patient's ventricular rate is much better controlled and kidney function improving, I would suggest continue of current meds except will stop digoxin and IV cardizem, advance activity as tolerated. I would not at this point suggest DCCV. Only medication which might be appropriate for maintenance of NSR is amiodarone and I do not think she would tolerate this. Would reconsider this treatment option as an outpatient or if rate cannot be controlled. Will recheck cxr. It is difficult to know her volume status though I suspect she is euvolemic now. Ok from my perspective to discharge home on metoprolol at her current dose.   Leonia Reeves.D.

## 2013-09-29 NOTE — H&P (Deleted)
[facility] PROGRESS NOTE   [name] BJY:[id number] DOB: 08/24/1924 DOA: 09/24/2013 PCP: Kirstie Peri, MD  Brief narrative: [name] is an 79 y.o. female with a PMH of atrial fibrillation and tachybradycardia syndrome status post pacemaker, that went to [facility] because of dyspnea and generalized weakness which has progressively gotten worse. She's she was found to be in atrial fibrillation with RVR was started on diltiazem drip and held no blood pressures in the 90s, also hemoglobin was checked which was 7.5 (previously 10), due to not having cardiology over the weekend and as per family request to transfer to [facility].   Assessment/Plan: Atrial fibrillation with RVR  - Off Coreg, cont metoprolol 100 mg BID, use metoprolol IV PRN. Digoxin stopped and IV Cardizem stopped by cardiology. - Appreciate Cardiology assistance Dr. Ladona Ridgel. Cont coumadin. No plans for DCCV.  Cleared for D/C but awaiting insurance authorization to return to SNF. - Baseline Cr < 1.0;Cr. 2.0 ->1.7-> 1.4.   - Repeated CXR compatible with small pleural effusion, she relates no SOB. Afebrile but with leukocytosis and dysphagia, will start Levaquin for possible aspiration.  AKI (acute kidney injury)  - Most likely due to over diuresis and diarrhea.  - Creatinine stable.  - Continue home dose lasix.  Acute confusional State/Delirium:  - AVOID ativan.  - Use haldol PRN.  - Most likely due to ativan and AKI.  -Now resolved.  Diarrhea:  - Recently started on PPI. On hold. Continue famotidine.  - Negative C.dif, continue pro-biotics and metamucil.  - Avoid sorbitol sweeteners.  Acute blood loss anemia:  - FOBT x1 negative. CT abdomen and pelvis negative for bleed.  - S/p 2 units PRBC 12.12. &12.13.2014 with an appropriate rise in hemoglobin. Hypothyroidism:  - cont synthroid.  Essential hypertension, benign  - Bp improved.  Dysphagia / Possible Aspiration PNA - Seen by ST, diet recommendations noted,  dysphagia III. - Added empiric Levaquin for possible Aspiration PNA.  Code Status: Full  Family Communication: Daughter and husband updated at bedside.  Disposition Plan: SNF   IV access:  Peripheral IV  Medical Consultants:  Cardiology.  Other Consultants:  Physical therapy  Anti-infectives:  Levaquin 09/29/13--->  HPI/Subjective: [name] still is complaining of some dyspnea, but it has improved since admission.  Occasional cough.  Weak.  Wants to go back to SNF.  Objective: Filed Vitals:   09/28/13 2017 09/29/13 0258 09/29/13 0644 09/29/13 0942  BP: 133/71 140/96 151/68 143/77  Pulse: 102 92 87 99  Temp: 98.4 F (36.9 C) 98.9 F (37.2 C) 98.2 F (36.8 C)   TempSrc: Oral Oral Oral   Resp: 22 20 20    Height:      Weight:   65.862 kg (145 lb 3.2 oz)   SpO2: 93% 96% 97%     Intake/Output Summary (Last 24 hours) at 09/29/13 1337 Last data filed at 09/29/13 1226  Gross per 24 hour  Intake    720 ml  Output   1076 ml  Net   -356 ml    Exam: Gen:  NAD Cardiovascular:  RRR, No M/R/G Respiratory:  Lungs CTAB Gastrointestinal:  Abdomen soft, NT/ND, + BS Extremities:  No C/E/C  Data Reviewed: Basic Metabolic Panel:  Recent Labs Lab 09/25/13 0923 09/26/13 1215 09/27/13 0359 09/28/13 0530 09/29/13 0415  NA 137 138 137 140 141  K 4.9 5.1 3.6 4.8 4.5  CL 104 104 97 107 108  CO2 22 22 30 20 22   GLUCOSE 108* 103* 102*  93 100*  BUN 53* 59* 52* 62* 59*  CREATININE 1.90* 1.92* 1.44* 1.53* 1.42*  CALCIUM 8.5 8.8 8.4 8.6 8.7   GFR Estimated Creatinine Clearance: 22.4 ml/min (by C-G formula based on Cr of 1.42). Liver Function Tests:  Recent Labs Lab 09/25/13 0923  AST 13  ALT 6  ALKPHOS 67  BILITOT 0.6  PROT 6.4  ALBUMIN 3.0*   Coagulation profile  Recent Labs Lab 09/26/13 1540 09/27/13 0359 09/28/13 0530 09/29/13 0415  INR 1.80* 1.86* 2.16* 2.32*    CBC:  Recent Labs Lab 09/24/13 1625 09/25/13 0923 09/29/13 0415  WBC 6.6  9.6 13.1*  HGB 7.7* 11.1* 11.7*  HCT 25.0* 35.1* 37.9  MCV 90.9 90.5 92.4  PLT 80* 89* 68*   CBG:  Recent Labs Lab 09/24/13 1603  GLUCAP 124*   Microbiology Recent Results (from the past 240 hour(s))  CLOSTRIDIUM DIFFICILE BY PCR     Status: None   Collection Time    09/27/13  1:54 AM      Result Value Range Status   C difficile by pcr NEGATIVE  NEGATIVE Final     Procedures and Diagnostic Studies: Ct Abdomen Pelvis Wo Contrast  09/25/2013   CLINICAL DATA:  Left lower quadrant tenderness, anemia, evaluate for retroperitoneal hemorrhage  EXAM: CT ABDOMEN AND PELVIS WITHOUT CONTRAST  TECHNIQUE: Multidetector CT imaging of the abdomen and pelvis was performed following the standard protocol without intravenous contrast.  COMPARISON:  01/11/2008  FINDINGS: Coronary atherosclerosis. Left subclavian pacemaker, incompletely visualized.  Moderate right and small left pleural effusions. Associated lower lobe opacities, likely atelectasis. Mild interstitial edema is suspected (series 4/ image 3).  Unenhanced liver, pancreas, and adrenal glands are within normal limits.  Splenomegaly, measuring 20.6 cm in craniocaudal dimension.  Status post cholecystectomy. No intrahepatic or extrahepatic ductal dilatation.  Bilateral renal cortical atrophy with multiple renal cysts, including a 2.7 cm lateral left upper pole renal cyst (series 3/ image 31) and a 2.3 cm medial right lower pole renal cyst (series 3/image 40). No renal calculi or hydronephrosis.  No evidence of bowel obstruction. Extensive colonic diverticulosis, without associated inflammatory changes.  Atherosclerotic calcifications of the abdominal aorta and branch vessels.  Small volume abdominopelvic ascites, likely simple. Perihepatic ascites measures just above simple fluid density, although this likely spuriously elevated due to streak artifact from the patient's arms.  Body wall edema.  No evidence of retroperitoneal hemorrhage.  No suspicious  abdominopelvic lymphadenopathy.  Bladder is decompressed by indwelling Foley catheter.  Uterus and bilateral ovaries are poorly evaluated.  Tiny right and small left fat containing inguinal hernias. Associated 2.8 x 3.2 cm fluid collection within the left hernia (series 3/image 88).  Degenerative changes of the visualized thoracolumbar spine. Exaggerated lower thoracic kyphosis.  IMPRESSION: No evidence of retroperitoneal hemorrhage.  Splenomegaly, measuring 20.6 cm in craniocaudal dimension.  Moderate right and small left pleural effusions with possible mild interstitial edema.  Small volume abdominopelvic ascites, simple.  Body wall edema.   Electronically Signed   By: Charline Bills M.D.   On: 09/25/2013 18:14   Dg Chest 1 View  09/27/2013   CLINICAL DATA:  Aspiration  EXAM: CHEST - 1 VIEW  COMPARISON:  09/23/2013.  FINDINGS: Progressive consolidation of the mid to lower lung zones. This may be partially explained by posteriorly layering pleural fluid. Basilar atelectasis/ infiltrates suspected. Pulmonary edema may contribute to this appearance. No gross pneumothorax.  Sequential pacemaker is in place entering from the left. Leads unchanged in position.  Calcified aorta.  IMPRESSION: Progressive consolidation of the mid to lower lung zones. This may be partially explained by posteriorly layering pleural fluid.  Basilar atelectasis/ infiltrates suspected.  Pulmonary edema may contribute to this appearance.   Electronically Signed   By: Bridgett Larsson M.D.   On: 09/27/2013 14:24   Dg Chest 2 View  09/28/2013   CLINICAL DATA:  Followup CHF.  EXAM: CHEST  2 VIEW  COMPARISON:  09/27/2013  FINDINGS: There is central vascular congestion, hazy perihilar airspace opacity and more confluent lower lung zone opacity, with moderate pleural effusions obscuring the hemidiaphragms. Moderate enlargement of the cardiac silhouette. Mediastinum normal in contour.  Bony thorax is demineralized.  Left anterior chest wall  sequential pacemaker is stable in well positioned.  IMPRESSION: Cardiomegaly and moderate bilateral pleural effusions. There is associated lung base opacity that is most likely atelectasis. Hazy perihilar opacities noted on the frontal view is likely due to layering pleural fluid, supported by the lateral view. No overt pulmonary edema.  Allowing for differences in patient positioning and technique, there has been no significant change from the previous day's study.   Electronically Signed   By: Amie Portland M.D.   On: 09/28/2013 13:02   Dg Swallowing Func-speech Pathology  09/27/2013   Lenor Derrick, CCC-SLP     09/27/2013  1:45 PM Objective Swallowing Evaluation:    Patient Details  Name: TAMMRA PRESSMAN MRN: [id number] Date of Birth: April 09, 1924  Today's Date: 09/27/2013 Time: 1300-1335 SLP Time Calculation (min): 35 min  Past Medical History:  Past Medical History  Diagnosis Date  . GERD (gastroesophageal reflux disease)   . Carotid stenosis     a. 92011 Carotid u/s: Right 50-69%, left <50%  . Atrial fibrillation     a. chronic coumadin;  b. 07/2011 Echo: EF 65%, triv MR, mildly  dil LA.  . Tachy-brady syndrome     a. 06/2010: SJM [employer] RF DR model WUJ811 (serial number  [id number])  . Essential hypertension, benign   . Hypothyroidism     a. Dx 09/2013   Past Surgical History:  Past Surgical History  Procedure Laterality Date  . Total knee arthroplasty      Right  . Laparoscopic cholecystectomy    . Pacemaker insertion  06/29/10    SJM implanted by Dr Johney Frame for tachy/brady syndrome   HPI:  Past medical history of atrial fibrillation and tachybradycardia  syndrome status post pacemaker, that went to [facility] because of  dyspnea and generalized weakness which has progressively gotten  worse. She's she was found to be in atrial fibrillation with RVR  was started on diltiazem drip and held no blood pressures in the  90s, also hemoglobin was checked which was 7.5 (previously 10),  due to not having cardiology over the  weekend and as per [facility]. She relates she continues to be weak  and mildly short of breath.also some shortness of breath with  minimal exertion.     Assessment / Plan / Recommendation Clinical Impression  Dysphagia Diagnosis: Mild pharyngeal phase dysphagia Clinical impression: Pt. had only a mild pharyngeal dysphagia  characterized by decreased tongue base contraction to the  pharyngeal wall and decreased pharyngeal perestalsis, resulting  residue of mechanical substance in the valleculae, which cleared  with a repeat dry swallow.  The pill lodged in the valleculae and  would not clear with liquids, but did clear with a bite of puree.   The pill also briefly  lodged in the distal esophagus, but  cleared with sip of thin liquid.  There was no aspiration or  penetration with any consistency, even with consecutive large  swallows of thin via straw.    Treatment Recommendation  No treatment recommended at this time    Diet Recommendation Dysphagia 3 (Mechanical Soft);Thin liquid   Liquid Administration via: Straw Medication Administration: Whole meds with puree Supervision: Patient able to self feed;Staff to assist with self  feeding;Full supervision/cueing for compensatory strategies Compensations: Slow rate;Small sips/bites;Follow solids with  liquid Postural Changes and/or Swallow Maneuvers: Seated upright 90  degrees    Other  Recommendations Oral Care Recommendations: Oral care  BID;Staff/trained caregiver to provide oral care Other Recommendations: Clarify dietary restrictions   Follow Up Recommendations  24 hour supervision/assistance    Frequency and Duration        Pertinent Vitals/Pain n/a    SLP Swallow Goals  n/a   General HPI: Past medical history of atrial fibrillation and  tachybradycardia syndrome status post pacemaker, that went to  [facility] because of dyspnea and generalized weakness which has  progressively gotten worse. She's she was found to be in atrial  fibrillation  with RVR was started on diltiazem drip and held no  blood pressures in the 90s, also hemoglobin was checked which was  7.5 (previously 10), due to not having cardiology over the  weekend and as per family request to [facility]. She  relates she continues to be weak and mildly short of breath.also  some shortness of breath with minimal exertion. Reason for Referral: Objectively evaluate swallowing function  (r/o silent aspiration) Previous Swallow Assessment: BSE no overt s/s of aspiration, but  pt. reports choking episodes at times and increased respiratory  effort during meals..  Objective eval to r/o silent aspiration. Diet Prior to this Study: NPO Temperature Spikes Noted: No Respiratory Status: Room air History of Recent Intubation: No Behavior/Cognition: Alert;Cooperative;Pleasant mood;Requires  cueing Oral Cavity - Dentition: Missing dentition;Dentures, not  available;Poor condition Oral Motor / Sensory Function: Within functional limits Self-Feeding Abilities: Needs assist Patient Positioning: Upright in chair Baseline Vocal Quality: Clear Volitional Cough: Weak Volitional Swallow: Able to elicit Anatomy: Within functional limits Pharyngeal Secretions: Not observed secondary MBS    Reason for Referral Objectively evaluate swallowing function (r/o  silent aspiration)   Oral Phase Oral Preparation/Oral Phase Oral Phase: WFL   Pharyngeal Phase Pharyngeal Phase Pharyngeal Phase: Impaired Pharyngeal - Solids Pharyngeal - Regular: Pharyngeal residue - valleculae Pharyngeal - Pill: Pharyngeal residue - valleculae  Cervical Esophageal Phase    GO    Cervical Esophageal Phase Cervical Esophageal Phase: WFL (Pill lodged in distal esophagus,  but cleared with next swall)         Willis, Lori T 09/27/2013, 1:44 PM     Scheduled Meds: . famotidine  20 mg Oral BID  . furosemide  20 mg Oral Daily  . metoprolol tartrate  100 mg Oral BID  . pneumococcal 23 valent vaccine  0.5 mL Intramuscular Tomorrow-1000  .  psyllium  1 packet Oral Daily  . saccharomyces boulardii  250 mg Oral BID  . sodium chloride  3 mL Intravenous Q12H  . warfarin  2.5 mg Oral ONCE-1800  . Warfarin - Pharmacist Dosing Inpatient   Does not apply q1800   Continuous Infusions:   Time spent: 25 minutes.   LOS: 5 days   RAMA,CHRISTINA  [facility] Pager [phone].   *Please note that the hospitalists switch teams on Wednesdays. Please  call the flow manager at [phone] if you are having difficulty reaching the hospitalist taking care of this patient as she can update you and provide the most up-to-date pager number of provider caring for the patient. If 8PM-8AM, please contact night-coverage at [email or web address], password [facility]  09/29/2013, 1:37 PM

## 2013-09-29 NOTE — Progress Notes (Signed)
Pt a/o, no c/o pain, pts HR in the 80's, no c/o SOB, vss, will continue to monitor

## 2013-09-29 NOTE — Progress Notes (Addendum)
ANTICOAGULATION/ANTIBIOTIC CONSULT NOTE   Pharmacy Consult for Coumadin and po levaquin Indication: atrial fibrillation and ?asp pna  Allergies  Allergen Reactions  . Penicillins Rash    Patient Measurements: Height: 4\' 10"  (147.3 cm) Weight: 145 lb 3.2 oz (65.862 kg) (scale C) IBW/kg (Calculated) : 40.9  Vital Signs: Temp: 98.2 F (36.8 C) (12/17 0644) Temp src: Oral (12/17 0644) BP: 143/77 mmHg (12/17 0942) Pulse Rate: 99 (12/17 0942)  Labs:  Recent Labs  09/27/13 0359 09/28/13 0530 09/29/13 0415  HGB  --   --  11.7*  HCT  --   --  37.9  PLT  --   --  68*  LABPROT 20.9* 23.4* 24.7*  INR 1.86* 2.16* 2.32*  CREATININE 1.44* 1.53* 1.42*    Estimated Creatinine Clearance: 22.4 ml/min (by C-G formula based on Cr of 1.42).  Assessment: 20 yoF on coumadin for afib. INR remains therapeutic and trending up. Hgb stable, but plt down to 68 (baseline this admit=80; 3 yrs ago=120). No bleeding noted. Coumadin PTA 2.5 mg MWFSun, 5mg  TTSat  Goal of Therapy:  INR 2-3 Monitor platelets by anticoagulation protocol: Yes   Plan:  Coumadin 2.5mg  x 1 tonight Daily INR, monitor for signs of bleeding Will follow plt trend closely  Christoper Fabian, PharmD, BCPS Clinical pharmacist, pager [phone] 09/29/2013 10:39 AM   Addendum 1415 Consult received for po levaquin per pharmacy for ?asp pna. Wbc wnl. Wbc slightly elevated to 13.1. No cx. CrCl ~22 ml/min.  Plan: 1) Levaquin 750mg  po q48h. 2) Will f/u renal function, pt's clinical condition  Christoper Fabian, PharmD, BCPS Clinical pharmacist, pager [phone] 09/29/2013 2:16 PM

## 2013-09-30 LAB — CBC
HCT: 35.4 % — ABNORMAL LOW (ref 36.0–46.0)
Hemoglobin: 11.2 g/dL — ABNORMAL LOW (ref 12.0–15.0)
MCH: 29.1 pg (ref 26.0–34.0)
MCHC: 31.6 g/dL (ref 30.0–36.0)
MCV: 91.9 fL (ref 78.0–100.0)
Platelets: 53 10*3/uL — ABNORMAL LOW (ref 150–400)
RBC: 3.85 MIL/uL — ABNORMAL LOW (ref 3.87–5.11)
RDW: 16.5 % — ABNORMAL HIGH (ref 11.5–15.5)
WBC: 11.2 10*3/uL — ABNORMAL HIGH (ref 4.0–10.5)

## 2013-09-30 LAB — BASIC METABOLIC PANEL
BUN: 52 mg/dL — ABNORMAL HIGH (ref 6–23)
CO2: 22 mEq/L (ref 19–32)
Calcium: 8.7 mg/dL (ref 8.4–10.5)
Chloride: 108 mEq/L (ref 96–112)
Creatinine, Ser: 1.35 mg/dL — ABNORMAL HIGH (ref 0.50–1.10)
GFR calc Af Amer: 40 mL/min — ABNORMAL LOW (ref >90)
GFR calc non Af Amer: 34 mL/min — ABNORMAL LOW (ref >90)
Glucose, Bld: 94 mg/dL (ref 70–99)
Potassium: 4.6 mEq/L (ref 3.5–5.1)
Sodium: 138 mEq/L (ref 135–145)

## 2013-09-30 LAB — PROTIME-INR: Prothrombin Time: 28.3 seconds — ABNORMAL HIGH (ref 11.6–15.2)

## 2013-09-30 MED ORDER — FAMOTIDINE 20 MG PO TABS: 20.0000 mg | ORAL_TABLET | Freq: Every day | ORAL | Status: DC

## 2013-09-30 MED ORDER — FAMOTIDINE 20 MG PO TABS
20.0000 mg | ORAL_TABLET | Freq: Every day | ORAL | Status: DC
  Administered 2013-09-30 – 2013-10-01 (×2): 20 mg via ORAL
  Filled 2013-09-30 (×2): qty 1

## 2013-09-30 MED ORDER — SACCHAROMYCES BOULARDII 250 MG PO CAPS: 250.0000 mg | ORAL_CAPSULE | Freq: Two times a day (BID) | ORAL | Status: DC

## 2013-09-30 MED ORDER — METOPROLOL TARTRATE 100 MG PO TABS: 100.0000 mg | ORAL_TABLET | Freq: Two times a day (BID) | ORAL | Status: DC

## 2013-09-30 MED ORDER — LEVOFLOXACIN 750 MG PO TABS: 750.0000 mg | ORAL_TABLET | ORAL | Status: DC

## 2013-09-30 MED ORDER — WARFARIN SODIUM 1 MG PO TABS
1.0000 mg | ORAL_TABLET | Freq: Once | ORAL | Status: AC
  Administered 2013-09-30: 18:00:00 1 mg via ORAL
  Filled 2013-09-30: qty 1

## 2013-09-30 MED ORDER — PSYLLIUM 95 % PO PACK: 1.0000 | PACK | Freq: Every day | ORAL | Status: DC

## 2013-09-30 NOTE — Progress Notes (Signed)
ANTICOAGULATION CONSULT NOTE   Pharmacy Consult for Coumadin  Indication: atrial fibrillation  Allergies  Allergen Reactions  . Penicillins Rash    Patient Measurements: Height: 4\' 10"  (147.3 cm) Weight: 144 lb 9.6 oz (65.59 kg) (scale b) IBW/kg (Calculated) : 40.9  Vital Signs: Temp: 97.2 F (36.2 C) (12/18 0533) Temp src: Oral (12/18 0533) BP: 149/69 mmHg (12/18 0533) Pulse Rate: 52 (12/18 0533)  Labs:  Recent Labs  09/28/13 0530 09/29/13 0415 09/30/13 0420  HGB  --  11.7* 11.2*  HCT  --  37.9 35.4*  PLT  --  68* 53*  LABPROT 23.4* 24.7* 28.3*  INR 2.16* 2.32* 2.77*  CREATININE 1.53* 1.42* 1.35*    Estimated Creatinine Clearance: 23.5 ml/min (by C-G formula based on Cr of 1.35).  Assessment: 28 yoF on coumadin for afib. INR remains therapeutic and trending up. Hgb stable, but plt down to 53 (baseline this admit=80; 3 yrs ago=120). No bleeding noted. Pt started on levaquin 12/17 which will increase effects of coumadin so will dose conservatively tonight. (Coumadin PTA 2.5 mg MWFSun, 5mg  TTSat)  Goal of Therapy:  INR 2-3 Monitor platelets by anticoagulation protocol: Yes   Plan:  Coumadin 1 mg x 1 tonight Daily INR, monitor for signs of bleeding Will follow plt trend closely (?need to hold coumadin if continue downward trend)  Christoper Fabian, PharmD, BCPS Clinical pharmacist, pager [phone] 09/30/2013 10:20 AM

## 2013-09-30 NOTE — Progress Notes (Signed)
[facility] PROGRESS NOTE     [name] ZOX:[id number] DOB: 1924/10/05 DOA: 09/24/2013 PCP: Kirstie Peri, MD   Brief narrative: AAMILAH AUGENSTEIN is an 79 y.o. female with a PMH of atrial fibrillation and tachybradycardia syndrome status post pacemaker, that went to [facility] because of dyspnea and generalized weakness which has progressively gotten worse. She's she was found to be in atrial fibrillation with RVR was started on diltiazem drip and held no blood pressures in the 90s, also hemoglobin was checked which was 7.5 (previously 10), due to not having cardiology over the weekend and as per family request to transfer to [facility].     Assessment/Plan: Atrial fibrillation with RVR   - Off Coreg, cont metoprolol 100 mg BID, use metoprolol IV PRN. Digoxin stopped and IV Cardizem stopped by cardiology. - Appreciate Cardiology assistance Dr. Ladona Ridgel. Cont coumadin. No plans for DCCV.  Cleared for D/C but awaiting insurance authorization to return to SNF. - Baseline Cr < 1.0;Cr. 2.0 ->1.7-> 1.4.    - Repeated CXR compatible with small pleural effusion, she relates no SOB. Afebrile but with leukocytosis and dysphagia, will start Levaquin for possible aspiration.   AKI (acute kidney injury)   - Most likely due to over diuresis and diarrhea.   - Creatinine stable.   - Continue home dose lasix.   Acute confusional State/Delirium:   - AVOID ativan.   - Use haldol PRN.   - Most likely due to ativan and AKI.   -Now resolved.   Diarrhea:   - Recently started on PPI. On hold. Continue famotidine.   - Negative C.dif, continue pro-biotics and metamucil.   - Avoid sorbitol sweeteners.   Acute blood loss anemia:   - FOBT x1 negative. CT abdomen and pelvis negative for bleed.   - S/p 2 units PRBC 12.12. &12.13.2014 with an appropriate rise in hemoglobin. Hypothyroidism:   - cont synthroid.   Essential hypertension, benign   - Bp improved.   Dysphagia / Possible Aspiration PNA - Seen by ST,  diet recommendations noted, dysphagia III. - Added empiric Levaquin for possible Aspiration PNA.   Code Status: Full   Family Communication: Daughter and husband updated at bedside.   Disposition Plan: SNF     IV access: Peripheral IV   Medical Consultants: Cardiology.   Other Consultants: Physical therapy   Anti-infectives: Levaquin 09/29/13--->   HPI/Subjective: [name] still is complaining of some dyspnea, but it has improved since admission.  Occasional cough.  Weak.  Wants to go back to SNF.   Objective: Filed Vitals:     09/28/13 2017  09/29/13 0258  09/29/13 0644  09/29/13 0942   BP:  133/71  140/96  151/68  143/77   Pulse:  102  92  87  99   Temp:  98.4 F (36.9 C)  98.9 F (37.2 C)  98.2 F (36.8 C)     TempSrc:  Oral  Oral  Oral     Resp:  22  20  20      Height:           Weight:      65.862 kg (145 lb 3.2 oz)     SpO2:  93%  96%  97%      Intake/Output Summary (Last 24 hours) at 09/29/13 1337 Last data filed at 09/29/13 1226   Gross per 24 hour   Intake     720 ml   Output    1076 ml   Net    -  356 ml      Exam: Gen:  NAD Cardiovascular:  RRR, No M/R/G Respiratory:  Lungs CTAB Gastrointestinal:  Abdomen soft, NT/ND, + BS Extremities:  No C/E/C   Data Reviewed: Basic Metabolic Panel: Recent Labs Lab  09/25/13 0923  09/26/13 1215  09/27/13 0359  09/28/13 0530  09/29/13 0415   NA  137  138  137  140  141   K  4.9  5.1  3.6  4.8  4.5   CL  104  104  97  107  108   CO2  22  22  30  20  22    GLUCOSE  108*  103*  102*  93  100*   BUN  53*  59*  52*  62*  59*   CREATININE  1.90*  1.92*  1.44*  1.53*  1.42*   CALCIUM  8.5  8.8  8.4  8.6  8.7    GFR Estimated Creatinine Clearance: 22.4 ml/min (by C-G formula based on Cr of 1.42). Liver Function Tests: Recent Labs Lab  09/25/13 0923   AST  13   ALT  6   ALKPHOS  67   BILITOT  0.6   PROT  6.4   ALBUMIN  3.0*    Coagulation profile Recent Labs Lab  09/26/13 1540   09/27/13 0359  09/28/13 0530  09/29/13 0415   INR  1.80*  1.86*  2.16*  2.32*      CBC: Recent Labs Lab  09/24/13 1625  09/25/13 0923  09/29/13 0415   WBC  6.6  9.6  13.1*   HGB  7.7*  11.1*  11.7*   HCT  25.0*  35.1*  37.9   MCV  90.9  90.5  92.4   PLT  80*  89*  68*    CBG: Recent Labs Lab  09/24/13 1603   GLUCAP  124*    Microbiology Recent Results (from the past 240 hour(s))   CLOSTRIDIUM DIFFICILE BY PCR     Status: None     Collection Time      09/27/13  1:54 AM       Result  Value  Range  Status     C difficile by pcr  NEGATIVE   NEGATIVE  Final        Procedures and Diagnostic Studies: Ct Abdomen Pelvis Wo Contrast   09/25/2013   CLINICAL DATA:  Left lower quadrant tenderness, anemia, evaluate for retroperitoneal hemorrhage  EXAM: CT ABDOMEN AND PELVIS WITHOUT CONTRAST  TECHNIQUE: Multidetector CT imaging of the abdomen and pelvis was performed following the standard protocol without intravenous contrast.  COMPARISON: 01/11/2008  FINDINGS: Coronary atherosclerosis. Left subclavian pacemaker, incompletely visualized.  Moderate right and small left pleural effusions. Associated lower lobe opacities, likely atelectasis. Mild interstitial edema is suspected (series 4/ image 3).  Unenhanced liver, pancreas, and adrenal glands are within normal limits.  Splenomegaly, measuring 20.6 cm in craniocaudal dimension.  Status post cholecystectomy. No intrahepatic or extrahepatic ductal dilatation.  Bilateral renal cortical atrophy with multiple renal cysts, including a 2.7 cm lateral left upper pole renal cyst (series 3/ image 31) and a 2.3 cm medial right lower pole renal cyst (series 3/image 40). No renal calculi or hydronephrosis.  No evidence of bowel obstruction. Extensive colonic diverticulosis, without associated inflammatory changes.  Atherosclerotic calcifications of the abdominal aorta and branch vessels.  Small volume abdominopelvic ascites, likely simple. Perihepatic  ascites measures just above simple fluid density, although this likely spuriously  elevated due to streak artifact from the patient's arms.  Body wall edema.  No evidence of retroperitoneal hemorrhage.  No suspicious abdominopelvic lymphadenopathy.  Bladder is decompressed by indwelling Foley catheter.  Uterus and bilateral ovaries are poorly evaluated.  Tiny right and small left fat containing inguinal hernias. Associated 2.8 x 3.2 cm fluid collection within the left hernia (series 3/image 88).  Degenerative changes of the visualized thoracolumbar spine. Exaggerated lower thoracic kyphosis.  IMPRESSION: No evidence of retroperitoneal hemorrhage.  Splenomegaly, measuring 20.6 cm in craniocaudal dimension.  Moderate right and small left pleural effusions with possible mild interstitial edema.  Small volume abdominopelvic ascites, simple.  Body wall edema.   Electronically Signed   By: Charline Bills M.D.   On: 09/25/2013 18:14    Dg Chest 1 View   09/27/2013   CLINICAL DATA:  Aspiration  EXAM: CHEST - 1 VIEW  COMPARISON:  09/23/2013.  FINDINGS: Progressive consolidation of the mid to lower lung zones. This may be partially explained by posteriorly layering pleural fluid. Basilar atelectasis/ infiltrates suspected. Pulmonary edema may contribute to this appearance. No gross pneumothorax.  Sequential pacemaker is in place entering from the left. Leads unchanged in position.  Calcified aorta.  IMPRESSION: Progressive consolidation of the mid to lower lung zones. This may be partially explained by posteriorly layering pleural fluid.  Basilar atelectasis/ infiltrates suspected.  Pulmonary edema may contribute to this appearance.   Electronically Signed   By: Bridgett Larsson M.D.   On: 09/27/2013 14:24    Dg Chest 2 View   09/28/2013   CLINICAL DATA:  Followup CHF.  EXAM: CHEST  2 VIEW  COMPARISON: 09/27/2013  FINDINGS: There is central vascular congestion, hazy perihilar airspace opacity and more confluent  lower lung zone opacity, with moderate pleural effusions obscuring the hemidiaphragms. Moderate enlargement of the cardiac silhouette. Mediastinum normal in contour.  Bony thorax is demineralized.  Left anterior chest wall sequential pacemaker is stable in well positioned.  IMPRESSION: Cardiomegaly and moderate bilateral pleural effusions. There is associated lung base opacity that is most likely atelectasis. Hazy perihilar opacities noted on the frontal view is likely due to layering pleural fluid, supported by the lateral view. No overt pulmonary edema.  Allowing for differences in patient positioning and technique, there has been no significant change from the previous day's study.   Electronically Signed   By: Amie Portland M.D.   On: 09/28/2013 13:02    Dg Swallowing Func-speech Pathology   09/27/2013   Lenor Derrick, CCC-SLP     09/27/2013  1:45 PM Objective Swallowing Evaluation:    Patient Details  Name: [name] MRN: [id number] Date of Birth: 02-10-1924  Today's Date: 09/27/2013 Time: 1300-1335 SLP Time Calculation (min): 35 min  Past Medical History:  Past Medical History  Diagnosis Date  . GERD (gastroesophageal reflux disease)   . Carotid stenosis     a. 92011 Carotid u/s: Right 50-69%, left <50%  . Atrial fibrillation     a. chronic coumadin;  b. 07/2011 Echo: EF 65%, triv MR, mildly  dil LA.  . Tachy-brady syndrome     a. 06/2010: SJM Accent RF DR model NFA213 (serial number  [id number])  . Essential hypertension, benign   . Hypothyroidism    a. Dx 09/2013   Past Surgical History:  Past Surgical History  Procedure Laterality Date  . Total knee arthroplasty      Right  . Laparoscopic cholecystectomy    . Pacemaker insertion  06/29/10  SJM implanted by Dr Johney Frame for tachy/brady syndrome   HPI:  Past medical history of atrial fibrillation and tachybradycardia  syndrome status post pacemaker, that went to [facility] because of  dyspnea and generalized weakness which has progressively gotten  worse.  She's she was found to be in atrial fibrillation with RVR  was started on diltiazem drip and held no blood pressures in the  90s, also hemoglobin was checked which was 7.5 (previously 10),  due to not having cardiology over the weekend and as per [facility]. She relates she continues to be weak  and mildly short of breath.also some shortness of breath with  minimal exertion.     Assessment / Plan / Recommendation Clinical Impression  Dysphagia Diagnosis: Mild pharyngeal phase dysphagia Clinical impression: Pt. had only a mild pharyngeal dysphagia  characterized by decreased tongue base contraction to the  pharyngeal wall and decreased pharyngeal perestalsis, resulting  residue of mechanical substance in the valleculae, which cleared  with a repeat dry swallow.  The pill lodged in the valleculae and  would not clear with liquids, but did clear with a bite of puree.   The pill also briefly lodged in the distal esophagus, but  cleared with sip of thin liquid.  There was no aspiration or  penetration with any consistency, even with consecutive large  swallows of thin via straw.    Treatment Recommendation  No treatment recommended at this time    Diet Recommendation Dysphagia 3 (Mechanical Soft);Thin liquid   Liquid Administration via: Straw Medication Administration: Whole meds with puree Supervision: Patient able to self feed;Staff to assist with self  feeding;Full supervision/cueing for compensatory strategies Compensations: Slow rate;Small sips/bites;Follow solids with  liquid Postural Changes and/or Swallow Maneuvers: Seated upright 90  degrees    Other  Recommendations Oral Care Recommendations: Oral care  BID;Staff/trained caregiver to provide oral care Other Recommendations: Clarify dietary restrictions   Follow Up Recommendations  24 hour supervision/assistance    Frequency and Duration        Pertinent Vitals/Pain n/a    SLP Swallow Goals  n/a   General HPI: Past medical history of  atrial fibrillation and  tachybradycardia syndrome status post pacemaker, that went to  [facility] because of dyspnea and generalized weakness which has  progressively gotten worse. She's she was found to be in atrial  fibrillation with RVR was started on diltiazem drip and held no  blood pressures in the 90s, also hemoglobin was checked which was  7.5 (previously 10), due to not having cardiology over the  weekend and as per [facility] request to [facility]. She  relates she continues to be weak and mildly short of breath.also  some shortness of breath with minimal exertion. Reason for Referral: Objectively evaluate swallowing function  (r/o silent aspiration) Previous Swallow Assessment: BSE no overt s/s of aspiration, but  pt. reports choking episodes at times and increased respiratory  effort during meals..  Objective eval to r/o silent aspiration. Diet Prior to this Study: NPO Temperature Spikes Noted: No Respiratory Status: Room air History of Recent Intubation: No Behavior/Cognition: Alert;Cooperative;Pleasant mood;Requires  cueing Oral Cavity - Dentition: Missing dentition;Dentures, not  available;Poor condition Oral Motor / Sensory Function: Within functional limits Self-Feeding Abilities: Needs assist Patient Positioning: Upright in chair Baseline Vocal Quality: Clear Volitional Cough: Weak Volitional Swallow: Able to elicit Anatomy: Within functional limits Pharyngeal Secretions: Not observed secondary MBS    Reason for Referral Objectively evaluate swallowing function (r/o  silent aspiration)   Oral Phase Oral Preparation/Oral Phase Oral Phase: WFL   Pharyngeal Phase Pharyngeal Phase Pharyngeal Phase: Impaired Pharyngeal - Solids Pharyngeal - Regular: Pharyngeal residue - valleculae Pharyngeal - Pill: Pharyngeal residue - valleculae  Cervical Esophageal Phase    GO    Cervical Esophageal Phase Cervical Esophageal Phase: WFL (Pill lodged in distal esophagus,  but cleared with next swall)         Willis,  Lori T 09/27/2013, 1:44 PM        Scheduled Meds: .  famotidine   20 mg  Oral  BID   .  furosemide   20 mg  Oral  Daily   .  metoprolol tartrate   100 mg  Oral  BID   .  pneumococcal 23 valent vaccine   0.5 mL  Intramuscular  Tomorrow-1000   .  psyllium   1 packet  Oral  Daily   .  saccharomyces boulardii   250 mg  Oral  BID   .  sodium chloride   3 mL  Intravenous  Q12H   .  warfarin   2.5 mg  Oral  ONCE-1800   .  Warfarin - Pharmacist Dosing Inpatient     Does not apply  q1800    Continuous Infusions:    Time spent: 25 minutes.   LOS: 5 days    Haruo Stepanek     [facility] Pager [phone].    *Please note that the hospitalists switch teams on Wednesdays. Please call the flow manager at [phone] if you are having difficulty reaching the hospitalist taking care of this patient as she can update you and provide the most up-to-date pager number of provider caring for the patient. If 8PM-8AM, please contact night-coverage at [email or web address], password [email or web address]   09/29/2013, 1:37 PM                                Revision History...     Date/Time User Action   09/30/2013 5:03 PM Maryruth Bun Joey Hudock, MD Addend   09/30/2013 8:34 AM Maryruth Bun Matalie Romberger, MD Addend   09/30/2013 8:32 AM Maryruth Bun Roniyah Llorens, MD Incomplete Revision   09/29/2013 5:58 PM Maryruth Bun Presley Summerlin, MD Sign  View Details Report   Routing History...     Date/Time From To Method   09/30/2013 5:03 PM Maryruth Bun Asjah Rauda, MD Kirstie Peri, MD Fax

## 2013-09-30 NOTE — Discharge Summary (Signed)
Physician Discharge Summary  SHAKISHA ABEND ZOX:[id number] DOB: 14-Mar-1924 DOA: 09/24/2013  PCP: Kirstie Peri, MD  Admit date: 09/24/2013 Discharge date: 09/30/2013  Recommendations for Outpatient Follow-up:  1. Monitor INR daily while on levofloxacin. 2. Need close monitoring of her daily weights/intake and output records. Monitor renal function while adjusting diuretic therapy.  Discharge Diagnoses:  Principal Problem:    Atrial fibrillation with RVR Active Problems:    BRADYCARDIA-TACHYCARDIA SYNDROME    CAROTID ARTERY STENOSIS    PACEMAKER-St.Jude    Essential hypertension, benign    Acute Anemia    Dyspnea    Hypothyroidism    AKI (acute kidney injury)    Acute confusional state    Other dysphagia    Aspiration pneumonia    Acute on chronic systolic and diastolic CHF    Diarrhea   Discharge Condition: Improved.  Diet recommendation: Low-sodium, heart healthy. Dysphagia 3.  History of present illness:  [name] is an 79 y.o. female with a PMH of atrial fibrillation, tachybradycardia syndrome status post pacemaker, who presented to an outside hospital with generalized weakness and dyspnea. She was found to be in atrial fibrillation with rapid ventricular response and started on a diltiazem drip. She was also found to be anemic with a hemoglobin of 7.5. She was transferred to [facility] on 09/24/2013.  Hospital Course by problem:  Principal Problem:  Atrial fibrillation with RVR  Patient was admitted and continued on a diltiazem drip. She was treated with beta blocker, which was increased as blood pressure tolerated. Her atrial fibrillation was thought to be triggered from anemia, so she was transfused with 2 units of packed red blood cells. INR supratherapeutic on admission, so Coumadin held, but resumed on 09/26/2013. Digoxin and IV Cardizem discontinued by cardiology on 09/28/2013. No recommendations for DC cardioversion. Repeat chest x-ray  done 09/29/2013 which showed small pleural effusions. Cleared for discharge by cardiology on 09/29/2013 with recommendations to discharge home on metoprolol at her current dose.  Active Problems:  Acute on chronic systolic and diastolic CHF / dyspnea  2-D echo obtained 09/25/2013 and showed an EF of 35-40% with diffuse hypokinesis. Presumed diastolic dysfunction secondary to rapid atrial fibrillation contributory. EF was 65% 07/2011.   Will need close monitoring of her weight and intake/output. CAROTID ARTERY STENOSIS  Stable. PACEMAKER-St.Jude / BRADYCARDIA-TACHYCARDIA SYNDROME   Pacemaker interrogated by cardiology on 09/27/2013.  Essential hypertension, benign  Stable on antihypertensive medication. Acute Anemia  Fecal occult blood testing was negative. CT scan of the abdomen and pelvis was done to rule out retroperitoneal bleed, which was negative. Acute anemia may be in part, dilutional, no obvious blood loss. Hypothyroidism  Continue Synthroid.  AKI (acute kidney injury)  Thought to be secondary to diuretics. Lasix placed on hold. Baseline creatinine noted to be less than 1 on 09/24/2013. Okay to resume Lasix 10/01/2013 but would recommend close monitoring of renal function. Acute confusional state  Patient had intermittent confusion and required sedation with Ativan and Haldol. Mental status now back to baseline. Other dysphagia  Noted to have trouble swallowing on 09/26/2013. Speech therapy evaluation requested. Seen on 09/27/2013. Diet recommendations: Dysphasia 3.  Aspiration pneumonia  Given evidence of dysphasia, patient was started on empiric Levaquin 09/29/2013.  Diarrhea  Recently started on PPI, so this was held in case it was contributory. Treated with probiotics and Metamucil. C. difficile PCR negative.   Procedures:  2-D echocardiogram 09/25/2013: EF 35-40% with diffuse hypokinesis.  Consultations:  Dr. Lewayne Bunting, Cardiology  Discharge  Exam: Filed Vitals:    09/30/13 1129  BP: 160/69  Pulse: 105  Temp:   Resp:    Filed Vitals:   09/30/13 0533 09/30/13 1108 09/30/13 1109 09/30/13 1129  BP: 149/69  160/69 160/69  Pulse: 52 105 110 105  Temp: 97.2 F (36.2 C)     TempSrc: Oral     Resp: 18  20   Height:      Weight: 65.59 kg (144 lb 9.6 oz)     SpO2: 97%  99%     Gen:  NAD Cardiovascular:  RRR, No M/R/G Respiratory: Lungs CTAB Gastrointestinal: Abdomen soft, NT/ND with normal active bowel sounds. Extremities: No C/E/C   Discharge Instructions  Discharge Orders   Future Orders Complete By Expires   (HEART FAILURE PATIENTS) Call MD:  Anytime you have any of the following symptoms: 1) 3 pound weight gain in 24 hours or 5 pounds in 1 week 2) shortness of breath, with or without a dry hacking cough 3) swelling in the hands, feet or stomach 4) if you have to sleep on extra pillows at night in order to breathe.  As directed    Diet - low sodium heart healthy  As directed    Discharge instructions  As directed    Comments:     You were cared for by Dr. Hillery Aldo  (a hospitalist) during your hospital stay. If you have any questions about your discharge medications or the care you received while you were in the hospital after you are discharged, you can call the unit and ask to speak with the hospitalist on call if the hospitalist that took care of you is not available. Once you are discharged, your primary care physician will handle any further medical issues. Please note that NO REFILLS for any discharge medications will be authorized once you are discharged, as it is imperative that you return to your primary care physician (or establish a relationship with a primary care physician if you do not have one) for your aftercare needs so that they can reassess your need for medications and monitor your lab values.  Any outstanding tests can be reviewed by your PCP at your follow up visit.  It is also important to review any medicine changes with  your PCP.  Please bring these d/c instructions with you to your next visit so your physician can review these changes with you.  If you do not have a primary care physician, you can call [phone] for a physician referral.  It is highly recommended that you obtain a PCP for hospital follow up.   Increase activity slowly  As directed    Walk with assistance  As directed    Walker   As directed        Medication List    STOP taking these medications       benazepril 20 MG tablet  Commonly known as:  LOTENSIN     carvedilol 12.5 MG tablet  Commonly known as:  COREG     isosorbide mononitrate 30 MG 24 hr tablet  Commonly known as:  IMDUR     omeprazole 20 MG capsule  Commonly known as:  PRILOSEC      TAKE these medications       cholecalciferol 1000 UNITS tablet  Commonly known as:  VITAMIN D  Take 1,000 Units by mouth daily.     clotrimazole-betamethasone cream  Commonly known as:  LOTRISONE  Apply 1 application topically 2 (two) times  daily.     famotidine 20 MG tablet  Commonly known as:  PEPCID  Take 1 tablet (20 mg total) by mouth daily.     furosemide 20 MG tablet  Commonly known as:  LASIX  Take 20 mg by mouth daily.     levofloxacin 750 MG tablet  Commonly known as:  LEVAQUIN  Take 1 tablet (750 mg total) by mouth every other day.     metoprolol 100 MG tablet  Commonly known as:  LOPRESSOR  Take 1 tablet (100 mg total) by mouth 2 (two) times daily.     nitroGLYCERIN 0.4 MG SL tablet  Commonly known as:  NITROSTAT  Place 0.4 mg under the tongue every 5 (five) minutes as needed.     psyllium 95 % Pack  Commonly known as:  HYDROCIL/METAMUCIL  Take 1 packet by mouth daily.     saccharomyces boulardii 250 MG capsule  Commonly known as:  FLORASTOR  Take 1 capsule (250 mg total) by mouth 2 (two) times daily.     warfarin 2.5 MG tablet  Commonly known as:  COUMADIN  Take 2.5 mg by mouth See admin instructions. Take 2.5 mg by mouth on Sunday, Monday,  Wednesday and Friday. And take 5 mg on Tuesday, Thursday, and Saturday as directed.          The results of significant diagnostics from this hospitalization (including imaging, microbiology, ancillary and laboratory) are listed below for reference.    Significant Diagnostic Studies: Ct Abdomen Pelvis Wo Contrast  09/25/2013   CLINICAL DATA:  Left lower quadrant tenderness, anemia, evaluate for retroperitoneal hemorrhage  EXAM: CT ABDOMEN AND PELVIS WITHOUT CONTRAST  TECHNIQUE: Multidetector CT imaging of the abdomen and pelvis was performed following the standard protocol without intravenous contrast.  COMPARISON:  01/11/2008  FINDINGS: Coronary atherosclerosis. Left subclavian pacemaker, incompletely visualized.  Moderate right and small left pleural effusions. Associated lower lobe opacities, likely atelectasis. Mild interstitial edema is suspected (series 4/ image 3).  Unenhanced liver, pancreas, and adrenal glands are within normal limits.  Splenomegaly, measuring 20.6 cm in craniocaudal dimension.  Status post cholecystectomy. No intrahepatic or extrahepatic ductal dilatation.  Bilateral renal cortical atrophy with multiple renal cysts, including a 2.7 cm lateral left upper pole renal cyst (series 3/ image 31) and a 2.3 cm medial right lower pole renal cyst (series 3/image 40). No renal calculi or hydronephrosis.  No evidence of bowel obstruction. Extensive colonic diverticulosis, without associated inflammatory changes.  Atherosclerotic calcifications of the abdominal aorta and branch vessels.  Small volume abdominopelvic ascites, likely simple. Perihepatic ascites measures just above simple fluid density, although this likely spuriously elevated due to streak artifact from the patient's arms.  Body wall edema.  No evidence of retroperitoneal hemorrhage.  No suspicious abdominopelvic lymphadenopathy.  Bladder is decompressed by indwelling Foley catheter.  Uterus and bilateral ovaries are poorly  evaluated.  Tiny right and small left fat containing inguinal hernias. Associated 2.8 x 3.2 cm fluid collection within the left hernia (series 3/image 88).  Degenerative changes of the visualized thoracolumbar spine. Exaggerated lower thoracic kyphosis.  IMPRESSION: No evidence of retroperitoneal hemorrhage.  Splenomegaly, measuring 20.6 cm in craniocaudal dimension.  Moderate right and small left pleural effusions with possible mild interstitial edema.  Small volume abdominopelvic ascites, simple.  Body wall edema.   Electronically Signed   By: Charline Bills M.D.   On: 09/25/2013 18:14   Dg Chest 1 View  09/27/2013   CLINICAL DATA:  Aspiration  EXAM: CHEST -  1 VIEW  COMPARISON:  09/23/2013.  FINDINGS: Progressive consolidation of the mid to lower lung zones. This may be partially explained by posteriorly layering pleural fluid. Basilar atelectasis/ infiltrates suspected. Pulmonary edema may contribute to this appearance. No gross pneumothorax.  Sequential pacemaker is in place entering from the left. Leads unchanged in position.  Calcified aorta.  IMPRESSION: Progressive consolidation of the mid to lower lung zones. This may be partially explained by posteriorly layering pleural fluid.  Basilar atelectasis/ infiltrates suspected.  Pulmonary edema may contribute to this appearance.   Electronically Signed   By: Bridgett Larsson M.D.   On: 09/27/2013 14:24   Dg Chest 2 View  09/28/2013   CLINICAL DATA:  Followup CHF.  EXAM: CHEST  2 VIEW  COMPARISON:  09/27/2013  FINDINGS: There is central vascular congestion, hazy perihilar airspace opacity and more confluent lower lung zone opacity, with moderate pleural effusions obscuring the hemidiaphragms. Moderate enlargement of the cardiac silhouette. Mediastinum normal in contour.  Bony thorax is demineralized.  Left anterior chest wall sequential pacemaker is stable in well positioned.  IMPRESSION: Cardiomegaly and moderate bilateral pleural effusions. There is  associated lung base opacity that is most likely atelectasis. Hazy perihilar opacities noted on the frontal view is likely due to layering pleural fluid, supported by the lateral view. No overt pulmonary edema.  Allowing for differences in patient positioning and technique, there has been no significant change from the previous day's study.   Electronically Signed   By: Amie Portland M.D.   On: 09/28/2013 13:02   Dg Swallowing Func-speech Pathology  09/27/2013   Lenor Derrick, CCC-SLP     09/27/2013  1:45 PM Objective Swallowing Evaluation:    Patient Details  Name: FOY MUNGIA MRN: [id number] Date of Birth: 05-08-1924  Today's Date: 09/27/2013 Time: 1300-1335 SLP Time Calculation (min): 35 min  Past Medical History:  Past Medical History  Diagnosis Date  . GERD (gastroesophageal reflux disease)   . Carotid stenosis     a. 92011 Carotid u/s: Right 50-69%, left <50%  . Atrial fibrillation     a. chronic coumadin;  b. 07/2011 Echo: EF 65%, triv MR, mildly  dil LA.  . Tachy-brady syndrome     a. 06/2010: SJM Acc[employer] RF DR model FAO130 (serial number  [id number])  . Essential hypertension, benign   . Hypothyroidism     a. Dx 09/2013   Past Surgical History:  Past Surgical History  Procedure Laterality Date  . Total knee arthroplasty      Right  . Laparoscopic cholecystectomy    . Pacemaker insertion  06/29/10    SJM implanted by Dr Johney Frame for tachy/brady syndrome   HPI:  Past medical history of atrial fibrillation and tachybradycardia  syndrome status post pacemaker, that went to [facility] because of  dyspnea and generalized weakness which has progressively gotten  worse. She's she was found to be in atrial fibrillation with RVR  was started on diltiazem drip and held no blood pressures in the  90s, also hemoglobin was checked which was 7.5 (previously 10),  due to not having cardiology over the weekend and as per family  request to [place] [facility]. She relates she continues to be weak  and mildly short of breath.also  some shortness of breath with  minimal exertion.     Assessment / Plan / Recommendation Clinical Impression  Dysphagia Diagnosis: Mild pharyngeal phase dysphagia Clinical impression: Pt. had only a mild pharyngeal dysphagia  characterized by decreased tongue  base contraction to the  pharyngeal wall and decreased pharyngeal perestalsis, resulting  residue of mechanical substance in the valleculae, which cleared  with a repeat dry swallow.  The pill lodged in the valleculae and  would not clear with liquids, but did clear with a bite of puree.   The pill also briefly lodged in the distal esophagus, but  cleared with sip of thin liquid.  There was no aspiration or  penetration with any consistency, even with consecutive large  swallows of thin via straw.    Treatment Recommendation  No treatment recommended at this time    Diet Recommendation Dysphagia 3 (Mechanical Soft);Thin liquid   Liquid Administration via: Straw Medication Administration: Whole meds with puree Supervision: Patient able to self feed;Staff to assist with self  feeding;Full supervision/cueing for compensatory strategies Compensations: Slow rate;Small sips/bites;Follow solids with  liquid Postural Changes and/or Swallow Maneuvers: Seated upright 90  degrees    Other  Recommendations Oral Care Recommendations: Oral care  BID;Staff/trained caregiver to provide oral care Other Recommendations: Clarify dietary restrictions   Follow Up Recommendations  24 hour supervision/assistance    Frequency and Duration        Pertinent Vitals/Pain n/a    SLP Swallow Goals  n/a   General HPI: Past medical history of atrial fibrillation and  tachybradycardia syndrome status post pacemaker, that went to  [facility] because of dyspnea and generalized weakness which has  progressively gotten worse. She's she was found to be in atrial  fibrillation with RVR was started on diltiazem drip and held no  blood pressures in the 90s, also hemoglobin was checked which was  7.5  (previously 10), due to not having cardiology over the  weekend and as per family request to transfer to [facility]. She  relates she continues to be weak and mildly short of breath.also  some shortness of breath with minimal exertion. Reason for Referral: Objectively evaluate swallowing function  (r/o silent aspiration) Previous Swallow Assessment: BSE no overt s/s of aspiration, but  pt. reports choking episodes at times and increased respiratory  effort during meals..  Objective eval to r/o silent aspiration. Diet Prior to this Study: NPO Temperature Spikes Noted: No Respiratory Status: Room air History of Recent Intubation: No Behavior/Cognition: Alert;Cooperative;Pleasant mood;Requires  cueing Oral Cavity - Dentition: Missing dentition;Dentures, not  available;Poor condition Oral Motor / Sensory Function: Within functional limits Self-Feeding Abilities: Needs assist Patient Positioning: Upright in chair Baseline Vocal Quality: Clear Volitional Cough: Weak Volitional Swallow: Able to elicit Anatomy: Within functional limits Pharyngeal Secretions: Not observed secondary MBS    Reason for Referral Objectively evaluate swallowing function (r/o  silent aspiration)   Oral Phase Oral Preparation/Oral Phase Oral Phase: WFL   Pharyngeal Phase Pharyngeal Phase Pharyngeal Phase: Impaired Pharyngeal - Solids Pharyngeal - Regular: Pharyngeal residue - valleculae Pharyngeal - Pill: Pharyngeal residue - valleculae  Cervical Esophageal Phase    GO    Cervical Esophageal Phase Cervical Esophageal Phase: WFL (Pill lodged in distal esophagus,  but cleared with next swall)         Maryjo Rochester T 09/27/2013, 1:44 PM     Labs:  Basic Metabolic Panel:  Recent Labs Lab 09/26/13 1215 09/27/13 0359 09/28/13 0530 09/29/13 0415 09/30/13 0420  NA 138 137 140 141 138  K 5.1 3.6 4.8 4.5 4.6  CL 104 97 107 108 108  CO2 22 30 20 22 22   GLUCOSE 103* 102* 93 100* 94  BUN 59* 52* 62* 59* 52*  CREATININE 1.92*  1.44* 1.53* 1.42* 1.35*   CALCIUM 8.8 8.4 8.6 8.7 8.7   GFR Estimated Creatinine Clearance: 23.5 ml/min (by C-G formula based on Cr of 1.35). Liver Function Tests:  Recent Labs Lab 09/25/13 0923  AST 13  ALT 6  ALKPHOS 67  BILITOT 0.6  PROT 6.4  ALBUMIN 3.0*   Coagulation profile  Recent Labs Lab 09/26/13 1540 09/27/13 0359 09/28/13 0530 09/29/13 0415 09/30/13 0420  INR 1.80* 1.86* 2.16* 2.32* 2.77*    CBC:  Recent Labs Lab 09/24/13 1625 09/25/13 0923 09/29/13 0415 09/30/13 0420  WBC 6.6 9.6 13.1* 11.2*  HGB 7.7* 11.1* 11.7* 11.2*  HCT 25.0* 35.1* 37.9 35.4*  MCV 90.9 90.5 92.4 91.9  PLT 80* 89* 68* 53*   CBG:  Recent Labs Lab 09/24/13 1603  GLUCAP 124*   Microbiology Recent Results (from the past 240 hour(s))  CLOSTRIDIUM DIFFICILE BY PCR     Status: None   Collection Time    09/27/13  1:54 AM      Result Value Range Status   C difficile by pcr NEGATIVE  NEGATIVE Final    Time coordinating discharge: 35 minutes.  Signed:  RAMA,CHRISTINA  Pager [phone] [facility] 09/30/2013, 2:24 PM

## 2013-09-30 NOTE — Progress Notes (Deleted)
[facility] PROGRESS NOTE     DIANE HANEL ZOX:[id number] DOB: 02/18/1924 DOA: 09/24/2013 PCP: Kirstie Peri, MD   Brief narrative: [name] is an 79 y.o. female with a PMH of atrial fibrillation and tachybradycardia syndrome status post pacemaker, that went to [facility] because of dyspnea and generalized weakness which has progressively gotten worse. She's she was found to be in atrial fibrillation with RVR was started on diltiazem drip and held no blood pressures in the 90s, also hemoglobin was checked which was 7.5 (previously 10), due to not having cardiology over the weekend and as per family request to transfer to [facility].     Assessment/Plan: Atrial fibrillation with RVR   - Off Coreg, cont metoprolol 100 mg BID, use metoprolol IV PRN. Digoxin stopped and IV Cardizem stopped by cardiology. - Appreciate Cardiology assistance Dr. Ladona Ridgel. Cont coumadin. No plans for DCCV.  Cleared for D/C but awaiting insurance authorization to return to SNF. - Baseline Cr < 1.0;Cr. 2.0 ->1.7-> 1.4.    - Repeated CXR compatible with small pleural effusion, she relates no SOB. Afebrile but with leukocytosis and dysphagia, will start Levaquin for possible aspiration.   AKI (acute kidney injury)   - Most likely due to over diuresis and diarrhea.   - Creatinine stable.   - Continue home dose lasix.   Acute confusional State/Delirium:   - AVOID ativan.   - Use haldol PRN.   - Most likely due to ativan and AKI.   -Now resolved.   Diarrhea:   - Recently started on PPI. On hold. Continue famotidine.   - Negative C.dif, continue pro-biotics and metamucil.   - Avoid sorbitol sweeteners.   Normocytic Anemia:   - FOBT x1 negative. CT abdomen and pelvis negative for bleed.   - S/p 2 units PRBC 12.12. &12.13.2014   Hypothyroidism:   - cont synthroid.   Essential hypertension, benign   - Bp improved.   Dysphagia / Possible Aspiration PNA - Seen by ST, diet recommendations noted, dysphagia  III. - Added empiric Levaquin for possible Aspiration PNA.   Code Status: Full   Family Communication: Daughter and husband updated at bedside.   Disposition Plan: SNF     IV access: Peripheral IV   Medical Consultants: Cardiology.   Other Consultants: Physical therapy   Anti-infectives: Levaquin 09/29/13--->   HPI/Subjective: [name] still is complaining of some dyspnea, but it has improved since admission.  Occasional cough.  Weak.  Wants to go back to SNF.   Objective: Filed Vitals:     09/28/13 2017  09/29/13 0258  09/29/13 0644  09/29/13 0942   BP:  133/71  140/96  151/68  143/77   Pulse:  102  92  87  99   Temp:  98.4 F (36.9 C)  98.9 F (37.2 C)  98.2 F (36.8 C)     TempSrc:  Oral  Oral  Oral     Resp:  22  20  20      Height:           Weight:      65.862 kg (145 lb 3.2 oz)     SpO2:  93%  96%  97%      Intake/Output Summary (Last 24 hours) at 09/29/13 1337 Last data filed at 09/29/13 1226   Gross per 24 hour   Intake     720 ml   Output    1076 ml   Net    -356 ml  Exam: Gen:  NAD Cardiovascular:  RRR, No M/R/G Respiratory:  Lungs CTAB Gastrointestinal:  Abdomen soft, NT/ND, + BS Extremities:  No C/E/C   Data Reviewed: Basic Metabolic Panel: Recent Labs Lab  09/25/13 0923  09/26/13 1215  09/27/13 0359  09/28/13 0530  09/29/13 0415   NA  137  138  137  140  141   K  4.9  5.1  3.6  4.8  4.5   CL  104  104  97  107  108   CO2  22  22  30  20  22    GLUCOSE  108*  103*  102*  93  100*   BUN  53*  59*  52*  62*  59*   CREATININE  1.90*  1.92*  1.44*  1.53*  1.42*   CALCIUM  8.5  8.8  8.4  8.6  8.7    GFR Estimated Creatinine Clearance: 22.4 ml/min (by C-G formula based on Cr of 1.42). Liver Function Tests: Recent Labs Lab  09/25/13 0923   AST  13   ALT  6   ALKPHOS  67   BILITOT  0.6   PROT  6.4   ALBUMIN  3.0*    Coagulation profile Recent Labs Lab  09/26/13 1540  09/27/13 0359  09/28/13 0530   09/29/13 0415   INR  1.80*  1.86*  2.16*  2.32*      CBC: Recent Labs Lab  09/24/13 1625  09/25/13 0923  09/29/13 0415   WBC  6.6  9.6  13.1*   HGB  7.7*  11.1*  11.7*   HCT  25.0*  35.1*  37.9   MCV  90.9  90.5  92.4   PLT  80*  89*  68*    CBG: Recent Labs Lab  09/24/13 1603   GLUCAP  124*    Microbiology Recent Results (from the past 240 hour(s))   CLOSTRIDIUM DIFFICILE BY PCR     Status: None     Collection Time      09/27/13  1:54 AM       Result  Value  Range  Status     C difficile by pcr  NEGATIVE   NEGATIVE  Final        Procedures and Diagnostic Studies: Ct Abdomen Pelvis Wo Contrast   09/25/2013   CLINICAL DATA:  Left lower quadrant tenderness, anemia, evaluate for retroperitoneal hemorrhage  EXAM: CT ABDOMEN AND PELVIS WITHOUT CONTRAST  TECHNIQUE: Multidetector CT imaging of the abdomen and pelvis was performed following the standard protocol without intravenous contrast.  COMPARISON: 01/11/2008  FINDINGS: Coronary atherosclerosis. Left subclavian pacemaker, incompletely visualized.  Moderate right and small left pleural effusions. Associated lower lobe opacities, likely atelectasis. Mild interstitial edema is suspected (series 4/ image 3).  Unenhanced liver, pancreas, and adrenal glands are within normal limits.  Splenomegaly, measuring 20.6 cm in craniocaudal dimension.  Status post cholecystectomy. No intrahepatic or extrahepatic ductal dilatation.  Bilateral renal cortical atrophy with multiple renal cysts, including a 2.7 cm lateral left upper pole renal cyst (series 3/ image 31) and a 2.3 cm medial right lower pole renal cyst (series 3/image 40). No renal calculi or hydronephrosis.  No evidence of bowel obstruction. Extensive colonic diverticulosis, without associated inflammatory changes.  Atherosclerotic calcifications of the abdominal aorta and branch vessels.  Small volume abdominopelvic ascites, likely simple. Perihepatic ascites measures just above  simple fluid density, although this likely spuriously elevated due to streak artifact from the  patient's arms.  Body wall edema.  No evidence of retroperitoneal hemorrhage.  No suspicious abdominopelvic lymphadenopathy.  Bladder is decompressed by indwelling Foley catheter.  Uterus and bilateral ovaries are poorly evaluated.  Tiny right and small left fat containing inguinal hernias. Associated 2.8 x 3.2 cm fluid collection within the left hernia (series 3/image 88).  Degenerative changes of the visualized thoracolumbar spine. Exaggerated lower thoracic kyphosis.  IMPRESSION: No evidence of retroperitoneal hemorrhage.  Splenomegaly, measuring 20.6 cm in craniocaudal dimension.  Moderate right and small left pleural effusions with possible mild interstitial edema.  Small volume abdominopelvic ascites, simple.  Body wall edema.   Electronically Signed   By: Charline Bills M.D.   On: 09/25/2013 18:14    Dg Chest 1 View   09/27/2013   CLINICAL DATA:  Aspiration  EXAM: CHEST - 1 VIEW  COMPARISON:  09/23/2013.  FINDINGS: Progressive consolidation of the mid to lower lung zones. This may be partially explained by posteriorly layering pleural fluid. Basilar atelectasis/ infiltrates suspected. Pulmonary edema may contribute to this appearance. No gross pneumothorax.  Sequential pacemaker is in place entering from the left. Leads unchanged in position.  Calcified aorta.  IMPRESSION: Progressive consolidation of the mid to lower lung zones. This may be partially explained by posteriorly layering pleural fluid.  Basilar atelectasis/ infiltrates suspected.  Pulmonary edema may contribute to this appearance.   Electronically Signed   By: Bridgett Larsson M.D.   On: 09/27/2013 14:24    Dg Chest 2 View   09/28/2013   CLINICAL DATA:  Followup CHF.  EXAM: CHEST  2 VIEW  COMPARISON: 09/27/2013  FINDINGS: There is central vascular congestion, hazy perihilar airspace opacity and more confluent lower lung zone opacity, with  moderate pleural effusions obscuring the hemidiaphragms. Moderate enlargement of the cardiac silhouette. Mediastinum normal in contour.  Bony thorax is demineralized.  Left anterior chest wall sequential pacemaker is stable in well positioned.  IMPRESSION: Cardiomegaly and moderate bilateral pleural effusions. There is associated lung base opacity that is most likely atelectasis. Hazy perihilar opacities noted on the frontal view is likely due to layering pleural fluid, supported by the lateral view. No overt pulmonary edema.  Allowing for differences in patient positioning and technique, there has been no significant change from the previous day's study.   Electronically Signed   By: Amie Portland M.D.   On: 09/28/2013 13:02    Dg Swallowing Func-speech Pathology   09/27/2013   Lenor Derrick, CCC-SLP     09/27/2013  1:45 PM Objective Swallowing Evaluation:    Patient Details  Name: BERIT RACZKOWSKI MRN: [id number] Date of Birth: 1924-02-04  Today's Date: 09/27/2013 Time: 1300-1335 SLP Time Calculation (min): 35 min  Past Medical History:  Past Medical History  Diagnosis Date  . GERD (gastroesophageal reflux disease)   . Carotid stenosis     a. 92011 Carotid u/s: Right 50-69%, left <50%  . Atrial fibrillation     a. chronic coumadin;  b. 07/2011 Echo: EF 65%, triv MR, mildly  dil LA.  . Tachy-brady syndrome     a. 06/2010: SJM Acc[employer] RF DR model YQM578 (serial number  [id number])  . Essential hypertension, benign   . Hypothyroidism    a. Dx 09/2013   Past Surgical History:  Past Surgical History  Procedure Laterality Date  . Total knee arthroplasty      Right  . Laparoscopic cholecystectomy    . Pacemaker insertion  06/29/10    SJM implanted by Dr Johney Frame  for tachy/brady syndrome   HPI:  Past medical history of atrial fibrillation and tachybradycardia  syndrome status post pacemaker, that went to [facility] because of  dyspnea and generalized weakness which has progressively gotten  worse. She's she was found to be in  atrial fibrillation with RVR  was started on diltiazem drip and held no blood pressures in the  90s, also hemoglobin was checked which was 7.5 (previously 10),  due to not having cardiology over the weekend and as per [facility]. She relates she continues to be weak  and mildly short of breath.also some shortness of breath with  minimal exertion.     Assessment / Plan / Recommendation Clinical Impression  Dysphagia Diagnosis: Mild pharyngeal phase dysphagia Clinical impression: Pt. had only a mild pharyngeal dysphagia  characterized by decreased tongue base contraction to the  pharyngeal wall and decreased pharyngeal perestalsis, resulting  residue of mechanical substance in the valleculae, which cleared  with a repeat dry swallow.  The pill lodged in the valleculae and  would not clear with liquids, but did clear with a bite of puree.   The pill also briefly lodged in the distal esophagus, but  cleared with sip of thin liquid.  There was no aspiration or  penetration with any consistency, even with consecutive large  swallows of thin via straw.    Treatment Recommendation  No treatment recommended at this time    Diet Recommendation Dysphagia 3 (Mechanical Soft);Thin liquid   Liquid Administration via: Straw Medication Administration: Whole meds with puree Supervision: Patient able to self feed;Staff to assist with self  feeding;Full supervision/cueing for compensatory strategies Compensations: Slow rate;Small sips/bites;Follow solids with  liquid Postural Changes and/or Swallow Maneuvers: Seated upright 90  degrees    Other  Recommendations Oral Care Recommendations: Oral care  BID;Staff/trained caregiver to provide oral care Other Recommendations: Clarify dietary restrictions   Follow Up Recommendations  24 hour supervision/assistance    Frequency and Duration        Pertinent Vitals/Pain n/a    SLP Swallow Goals  n/a   General HPI: Past medical history of atrial fibrillation and   tachybradycardia syndrome status post pacemaker, that went to  [facility] because of dyspnea and generalized weakness which has  progressively gotten worse. She's she was found to be in atrial  fibrillation with RVR was started on diltiazem drip and held no  blood pressures in the 90s, also hemoglobin was checked which was  7.5 (previously 10), due to not having cardiology over the  weekend and as per family request to [facility]. She  relates she continues to be weak and mildly short of breath.also  some shortness of breath with minimal exertion. Reason for Referral: Objectively evaluate swallowing function  (r/o silent aspiration) Previous Swallow Assessment: BSE no overt s/s of aspiration, but  pt. reports choking episodes at times and increased respiratory  effort during meals..  Objective eval to r/o silent aspiration. Diet Prior to this Study: NPO Temperature Spikes Noted: No Respiratory Status: Room air History of Recent Intubation: No Behavior/Cognition: Alert;Cooperative;Pleasant mood;Requires  cueing Oral Cavity - Dentition: Missing dentition;Dentures, not  available;Poor condition Oral Motor / Sensory Function: Within functional limits Self-Feeding Abilities: Needs assist Patient Positioning: Upright in chair Baseline Vocal Quality: Clear Volitional Cough: Weak Volitional Swallow: Able to elicit Anatomy: Within functional limits Pharyngeal Secretions: Not observed secondary MBS    Reason for Referral Objectively evaluate swallowing function (r/o  silent aspiration)  Oral Phase Oral Preparation/Oral Phase Oral Phase: WFL   Pharyngeal Phase Pharyngeal Phase Pharyngeal Phase: Impaired Pharyngeal - Solids Pharyngeal - Regular: Pharyngeal residue - valleculae Pharyngeal - Pill: Pharyngeal residue - valleculae  Cervical Esophageal Phase    GO    Cervical Esophageal Phase Cervical Esophageal Phase: WFL (Pill lodged in distal esophagus,  but cleared with next swall)         Willis, Lori T 09/27/2013, 1:44  PM        Scheduled Meds: .  famotidine   20 mg  Oral  BID   .  furosemide   20 mg  Oral  Daily   .  metoprolol tartrate   100 mg  Oral  BID   .  pneumococcal 23 valent vaccine   0.5 mL  Intramuscular  Tomorrow-1000   .  psyllium   1 packet  Oral  Daily   .  saccharomyces boulardii   250 mg  Oral  BID   .  sodium chloride   3 mL  Intravenous  Q12H   .  warfarin   2.5 mg  Oral  ONCE-1800   .  Warfarin - Pharmacist Dosing Inpatient     Does not apply  q1800    Continuous Infusions:    Time spent: 25 minutes.   LOS: 5 days    Myliyah Rebuck     [facility] Pager [phone].    *Please note that the hospitalists switch teams on Wednesdays. Please call the flow manager at [phone] if you are having difficulty reaching the hospitalist taking care of this patient as she can update you and provide the most up-to-date pager number of provider caring for the patient. If 8PM-8AM, please contact night-coverage at [email or web address], password [facility]   09/29/2013, 1:37 PM                                Routing History...     Date/Time From To Method   09/29/2013 5:58 PM Maryruth Bun Varonica Siharath, MD Kirstie Peri, MD Fax

## 2013-09-30 NOTE — Progress Notes (Addendum)
Pt. Had a nosebleed at start of my shift from the Oxygen, I was going to hook pt's Oxygen to fluids but pt. Stated she was breathing fine without the Oxygen and her O2 Sats were between 99-100 on Room Air so I left Oxygen off. Remained stable throughout the shift and showed no signs and symptoms of distress or discomfort. Pt. Refused to eat her meals brought for lunch and dinner but ate some apple sauce and fruits. I educated pt. On importance of good nutrition. Pt. And family verbalized understanding.

## 2013-09-30 NOTE — Progress Notes (Signed)
Physical Therapy Treatment Patient Details Name: [name] MRN: [id number] DOB: [birth year] Today's Date: 09/30/2013 Time: 1191-4782 PT Time Calculation (min): 25 min  PT Assessment / Plan / Recommendation  History of Present Illness patient admitted with Afib, diarrhea and anemia.  Workup still in progress.   PT Comments   Pt continues to progress with gait and transfers and demonstrates continued confusion, word finding difficulty and errors with problem solving. Spouse present throughout and states he has noted more confusion and need to finish pt's sentences for the last few weeks. Will continue to follow  Follow Up Recommendations  SNF;Supervision/Assistance - 24 hour     Does the patient have the potential to tolerate intense rehabilitation     Barriers to Discharge        Equipment Recommendations       Recommendations for Other Services    Frequency     Progress towards PT Goals Progress towards PT goals: Progressing toward goals  Plan Current plan remains appropriate    Precautions / Restrictions Precautions Precautions: Fall   Pertinent Vitals/Pain No pain HR 105    Mobility  Bed Mobility Bed Mobility: Not assessed Transfers Sit to Stand: 4: Min guard;From chair/3-in-1;With armrests Stand to Sit: 4: Min guard;To chair/3-in-1;With armrests Details for Transfer Assistance: needed cues for hand placement x 2 trials at chair and recliner Ambulation/Gait Ambulation/Gait Assistance: 4: Min assist Ambulation Distance (Feet): 85 Feet (85' and 15' after seated rest) Assistive device: Rolling walker Ambulation/Gait Assistance Details: cues for position in RW, pt with tendency to rest on forearms on RW Gait Pattern: Step-through pattern;Decreased stride length;Trunk flexed Gait velocity: decreased    Exercises General Exercises - Lower Extremity Ankle Circles/Pumps: AROM;Seated;Both;20 reps Long Arc Quad: AROM;Both;Seated;20 reps Hip ABduction/ADduction:  AROM;Both;Seated;20 reps Hip Flexion/Marching: AROM;Both;Seated;20 reps   PT Diagnosis:    PT Problem List:   PT Treatment Interventions:     PT Goals (current goals can now be found in the care plan section)    Visit Information  Last PT Received On: 09/30/13 Assistance Needed: +1 History of Present Illness: patient admitted with Afib, diarrhea and anemia.  Workup still in progress.    Subjective Data      Cognition  Cognition Arousal/Alertness: Awake/alert Behavior During Therapy: WFL for tasks assessed/performed Overall Cognitive Status: Impaired/Different from baseline Area of Impairment: Memory;Problem solving Memory: Decreased short-term memory Problem Solving: Slow processing    Balance     End of Session PT - End of Session Equipment Utilized During Treatment: Gait belt Activity Tolerance: Patient tolerated treatment well Patient left: in chair;with call bell/phone within reach;with family/visitor present Nurse Communication: Mobility status   GP     Delorse Lek 09/30/2013, 11:20 AM Delaney Meigs, PT [phone]

## 2013-09-30 NOTE — Progress Notes (Signed)
[facility] PROGRESS NOTE   [name]:[id number] DOB: 22-Aug-1924 DOA: 09/24/2013 PCP: Katrina Peri, MD  Brief narrative: [name] is an 79 y.o. female with a PMH of atrial fibrillation, tachybradycardia syndrome status post pacemaker, who presented to an outside hospital with generalized weakness and dyspnea. She was found to be in atrial fibrillation with rapid ventricular response and started on a diltiazem drip. She was also found to be anemic with a hemoglobin of 7.5. She was transferred to [facility] on 09/24/2013.  Assessment/Plan: Principal Problem:   Atrial fibrillation with RVR Patient was admitted and continued on a diltiazem drip. She was treated with beta blocker, which was increased as blood pressure tolerated. Her atrial fibrillation was thought to be triggered from anemia, so she was transfused with 2 units of packed red blood cells.  INR supratherapeutic on admission, so Coumadin held, but resumed on 09/26/2013. Digoxin and IV Cardizem discontinued by cardiology on 09/28/2013. No recommendations for DC cardioversion. Repeat chest x-ray done 09/29/2013 which showed small pleural effusions. Cleared for discharge by cardiology on 09/29/2013 with recommendations to discharge home on metoprolol at her current dose. Active Problems:   Acute on chronic systolic and diastolic CHF / dyspnea 2-D echo obtained 09/25/2013 and showed an EF of 35-40% with diffuse hypokinesis. Presumed diastolic dysfunction secondary to rapid atrial fibrillation contributory. EF was 65% 07/2011.   CAROTID ARTERY STENOSIS Stable.   PACEMAKER-[employer] / BRADYCARDIA-TACHYCARDIA SYNDROME Pacemaker interrogated by cardiology on 09/27/2013.   Essential hypertension, benign BP controlled.   Acute Anemia Fecal occult blood testing was negative. CT scan of the abdomen and pelvis was done to rule out retroperitoneal bleed, which was negative.   Hypothyroidism Continue Synthroid.   AKI  (acute kidney injury) Thought to be secondary to diuretics. Lasix placed on hold. Baseline creatinine noted to be less than 1 on 09/24/2013.   Acute confusional state Patient had intermittent confusion required sedation with Ativan and Haldol.   Other dysphagia Noted to have trouble swallowing on 09/26/2013. Speech therapy evaluation requested. Seen on 09/27/2013. Diet recommendations: Dysphasia 3.   Aspiration pneumonia Given evidence of dysphasia, patient was started on empiric Levaquin 09/29/2013.   Diarrhea Recently started on PPI, so this was held in case it was contributory. Treated with probiotics and Metamucil. C. difficile PCR negative  Code Status: Full. Family Communication: Husband updated at bedside. Disposition Plan: SNF   IV access:  Peripheral IV  Medical Consultants:  Dr. Lewayne Bunting, Cardiology.  Other Consultants:  Physical therapy: 24 hour supervision versus SNF.  Anti-infectives:  Levaquin 09/29/13--->  HPI/Subjective: [name] feels well today. She is sitting up in a chair and her family thinks she is back to her usual baseline. No complaints of chest pain or dyspnea. Appetite fair.  Objective: Filed Vitals:   09/29/13 1401 09/29/13 2141 09/30/13 0038 09/30/13 0533  BP: 144/96 150/65 128/85 149/69  Pulse: 88 95 100 52  Temp: 97.1 F (36.2 C) 97.4 F (36.3 C)  97.2 F (36.2 C)  TempSrc: Oral Oral  Oral  Resp: 20 20  18   Height:      Weight:    65.59 kg (144 lb 9.6 oz)  SpO2: 98% 100%  97%    Intake/Output Summary (Last 24 hours) at 09/30/13 0834 Last data filed at 09/30/13 0826  Gross per 24 hour  Intake    540 ml  Output   1071 ml  Net   -531 ml    Exam: Gen:  NAD  Cardiovascular:  RRR, No M/R/G Respiratory:  Lungs CTAB Gastrointestinal:  Abdomen soft, NT/ND, + BS Extremities:  No C/E/C  Data Reviewed: Basic Metabolic Panel:  Recent Labs Lab 09/26/13 1215 09/27/13 0359 09/28/13 0530 09/29/13 0415 09/30/13 0420  NA  138 137 140 141 138  K 5.1 3.6 4.8 4.5 4.6  CL 104 97 107 108 108  CO2 22 30 20 22 22   GLUCOSE 103* 102* 93 100* 94  BUN 59* 52* 62* 59* 52*  CREATININE 1.92* 1.44* 1.53* 1.42* 1.35*  CALCIUM 8.8 8.4 8.6 8.7 8.7   GFR Estimated Creatinine Clearance: 23.5 ml/min (by C-G formula based on Cr of 1.35). Liver Function Tests:  Recent Labs Lab 09/25/13 0923  AST 13  ALT 6  ALKPHOS 67  BILITOT 0.6  PROT 6.4  ALBUMIN 3.0*   Coagulation profile  Recent Labs Lab 09/26/13 1540 09/27/13 0359 09/28/13 0530 09/29/13 0415 09/30/13 0420  INR 1.80* 1.86* 2.16* 2.32* 2.77*    CBC:  Recent Labs Lab 09/24/13 1625 09/25/13 0923 09/29/13 0415 09/30/13 0420  WBC 6.6 9.6 13.1* 11.2*  HGB 7.7* 11.1* 11.7* 11.2*  HCT 25.0* 35.1* 37.9 35.4*  MCV 90.9 90.5 92.4 91.9  PLT 80* 89* 68* 53*   CBG:  Recent Labs Lab 09/24/13 1603  GLUCAP 124*   Microbiology Recent Results (from the past 240 hour(s))  CLOSTRIDIUM DIFFICILE BY PCR     Status: None   Collection Time    09/27/13  1:54 AM      Result Value Range Status   C difficile by pcr NEGATIVE  NEGATIVE Final     Procedures and Diagnostic Studies: Ct Abdomen Pelvis Wo Contrast  09/25/2013   CLINICAL DATA:  Left lower quadrant tenderness, anemia, evaluate for retroperitoneal hemorrhage  EXAM: CT ABDOMEN AND PELVIS WITHOUT CONTRAST  TECHNIQUE: Multidetector CT imaging of the abdomen and pelvis was performed following the standard protocol without intravenous contrast.  COMPARISON:  01/11/2008  FINDINGS: Coronary atherosclerosis. Left subclavian pacemaker, incompletely visualized.  Moderate right and small left pleural effusions. Associated lower lobe opacities, likely atelectasis. Mild interstitial edema is suspected (series 4/ image 3).  Unenhanced liver, pancreas, and adrenal glands are within normal limits.  Splenomegaly, measuring 20.6 cm in craniocaudal dimension.  Status post cholecystectomy. No intrahepatic or extrahepatic  ductal dilatation.  Bilateral renal cortical atrophy with multiple renal cysts, including a 2.7 cm lateral left upper pole renal cyst (series 3/ image 31) and a 2.3 cm medial right lower pole renal cyst (series 3/image 40). No renal calculi or hydronephrosis.  No evidence of bowel obstruction. Extensive colonic diverticulosis, without associated inflammatory changes.  Atherosclerotic calcifications of the abdominal aorta and branch vessels.  Small volume abdominopelvic ascites, likely simple. Perihepatic ascites measures just above simple fluid density, although this likely spuriously elevated due to streak artifact from the patient's arms.  Body wall edema.  No evidence of retroperitoneal hemorrhage.  No suspicious abdominopelvic lymphadenopathy.  Bladder is decompressed by indwelling Foley catheter.  Uterus and bilateral ovaries are poorly evaluated.  Tiny right and small left fat containing inguinal hernias. Associated 2.8 x 3.2 cm fluid collection within the left hernia (series 3/image 88).  Degenerative changes of the visualized thoracolumbar spine. Exaggerated lower thoracic kyphosis.  IMPRESSION: No evidence of retroperitoneal hemorrhage.  Splenomegaly, measuring 20.6 cm in craniocaudal dimension.  Moderate right and small left pleural effusions with possible mild interstitial edema.  Small volume abdominopelvic ascites, simple.  Body wall edema.   Electronically Signed   By:  Charline Bills M.D.   On: 09/25/2013 18:14   Dg Chest 1 View  09/27/2013   CLINICAL DATA:  Aspiration  EXAM: CHEST - 1 VIEW  COMPARISON:  09/23/2013.  FINDINGS: Progressive consolidation of the mid to lower lung zones. This may be partially explained by posteriorly layering pleural fluid. Basilar atelectasis/ infiltrates suspected. Pulmonary edema may contribute to this appearance. No gross pneumothorax.  Sequential pacemaker is in place entering from the left. Leads unchanged in position.  Calcified aorta.  IMPRESSION:  Progressive consolidation of the mid to lower lung zones. This may be partially explained by posteriorly layering pleural fluid.  Basilar atelectasis/ infiltrates suspected.  Pulmonary edema may contribute to this appearance.   Electronically Signed   By: Bridgett Larsson M.D.   On: 09/27/2013 14:24   Dg Chest 2 View  09/28/2013   CLINICAL DATA:  Followup CHF.  EXAM: CHEST  2 VIEW  COMPARISON:  09/27/2013  FINDINGS: There is central vascular congestion, hazy perihilar airspace opacity and more confluent lower lung zone opacity, with moderate pleural effusions obscuring the hemidiaphragms. Moderate enlargement of the cardiac silhouette. Mediastinum normal in contour.  Bony thorax is demineralized.  Left anterior chest wall sequential pacemaker is stable in well positioned.  IMPRESSION: Cardiomegaly and moderate bilateral pleural effusions. There is associated lung base opacity that is most likely atelectasis. Hazy perihilar opacities noted on the frontal view is likely due to layering pleural fluid, supported by the lateral view. No overt pulmonary edema.  Allowing for differences in patient positioning and technique, there has been no significant change from the previous day's study.   Electronically Signed   By: Amie Portland M.D.   On: 09/28/2013 13:02   Dg Swallowing Func-speech Pathology  09/27/2013   Lenor Derrick, CCC-SLP     09/27/2013  1:45 PM Objective Swallowing Evaluation:    Patient Details  Name: [name] MRN: [id number] Date of Birth: [birth year]  Today's Date: 09/27/2013 Time: 1300-1335 SLP Time Calculation (min): 35 min  Past Medical History:  Past Medical History  Diagnosis Date  . GERD (gastroesophageal reflux disease)   . Carotid stenosis     a. 92011 Carotid u/s: Right 50-69%, left <50%  . Atrial fibrillation     a. chronic coumadin;  b. 07/2011 Echo: EF 65%, triv MR, mildly  dil LA.  . Tachy-brady syndrome     a. 06/2010: SJM [employer] RF DR model WUJ811 (serial number  [id number])  . Essential  hypertension, benign   . Hypothyroidism     a. Dx 09/2013   Past Surgical History:  Past Surgical History  Procedure Laterality Date  . Total knee arthroplasty      Right  . Laparoscopic cholecystectomy    . Pacemaker insertion  06/29/10    SJM implanted by Dr Johney Frame for tachy/brady syndrome   HPI:  Past medical history of atrial fibrillation and tachybradycardia  syndrome status post pacemaker, that went to [facility] because of  dyspnea and generalized weakness which has progressively gotten  worse. She's she was found to be in atrial fibrillation with RVR  was started on diltiazem drip and held no blood pressures in the  90s, also hemoglobin was checked which was 7.5 (previously 10),  due to not having cardiology over the weekend and as per [facility]  request to [facility]. She relates she continues to be weak  and mildly short of breath.also some shortness of breath with  minimal exertion.     Assessment /  Plan / Recommendation Clinical Impression  Dysphagia Diagnosis: Mild pharyngeal phase dysphagia Clinical impression: Pt. had only a mild pharyngeal dysphagia  characterized by decreased tongue base contraction to the  pharyngeal wall and decreased pharyngeal perestalsis, resulting  residue of mechanical substance in the valleculae, which cleared  with a repeat dry swallow.  The pill lodged in the valleculae and  would not clear with liquids, but did clear with a bite of puree.   The pill also briefly lodged in the distal esophagus, but  cleared with sip of thin liquid.  There was no aspiration or  penetration with any consistency, even with consecutive large  swallows of thin via straw.    Treatment Recommendation  No treatment recommended at this time    Diet Recommendation Dysphagia 3 (Mechanical Soft);Thin liquid   Liquid Administration via: Straw Medication Administration: Whole meds with puree Supervision: Patient able to self feed;Staff to assist with self  feeding;Full supervision/cueing for  compensatory strategies Compensations: Slow rate;Small sips/bites;Follow solids with  liquid Postural Changes and/or Swallow Maneuvers: Seated upright 90  degrees    Other  Recommendations Oral Care Recommendations: Oral care  BID;Staff/trained caregiver to provide oral care Other Recommendations: Clarify dietary restrictions   Follow Up Recommendations  24 hour supervision/assistance    Frequency and Duration        Pertinent Vitals/Pain n/a    SLP Swallow Goals  n/a   General HPI: Past medical history of atrial fibrillation and  tachybradycardia syndrome status post pacemaker, that went to  [facility] because of dyspnea and generalized weakness which has  progressively gotten worse. She's she was found to be in atrial  fibrillation with RVR was started on diltiazem drip and held no  blood pressures in the 90s, also hemoglobin was checked which was  7.5 (previously 10), due to not having cardiology over the  weekend and as per family request to [facility]. She  relates she continues to be weak and mildly short of breath.also  some shortness of breath with minimal exertion. Reason for Referral: Objectively evaluate swallowing function  (r/o silent aspiration) Previous Swallow Assessment: BSE no overt s/s of aspiration, but  pt. reports choking episodes at times and increased respiratory  effort during meals..  Objective eval to r/o silent aspiration. Diet Prior to this Study: NPO Temperature Spikes Noted: No Respiratory Status: Room air History of Recent Intubation: No Behavior/Cognition: Alert;Cooperative;Pleasant mood;Requires  cueing Oral Cavity - Dentition: Missing dentition;Dentures, not  available;Poor condition Oral Motor / Sensory Function: Within functional limits Self-Feeding Abilities: Needs assist Patient Positioning: Upright in chair Baseline Vocal Quality: Clear Volitional Cough: Weak Volitional Swallow: Able to elicit Anatomy: Within functional limits Pharyngeal Secretions: Not observed secondary  MBS    Reason for Referral Objectively evaluate swallowing function (r/o  silent aspiration)   Oral Phase Oral Preparation/Oral Phase Oral Phase: WFL   Pharyngeal Phase Pharyngeal Phase Pharyngeal Phase: Impaired Pharyngeal - Solids Pharyngeal - Regular: Pharyngeal residue - valleculae Pharyngeal - Pill: Pharyngeal residue - valleculae  Cervical Esophageal Phase    GO    Cervical Esophageal Phase Cervical Esophageal Phase: WFL (Pill lodged in distal esophagus,  but cleared with next swall)         Willis, Lori T 09/27/2013, 1:44 PM     Scheduled Meds: . famotidine  20 mg Oral BID  . furosemide  20 mg Oral Daily  . levofloxacin  750 mg Oral Q48H  . metoprolol tartrate  100 mg Oral BID  . pneumococcal  23 valent vaccine  0.5 mL Intramuscular Tomorrow-1000  . psyllium  1 packet Oral Daily  . saccharomyces boulardii  250 mg Oral BID  . sodium chloride  3 mL Intravenous Q12H  . Warfarin - Pharmacist Dosing Inpatient   Does not apply q1800   Continuous Infusions:   Time spent: 25 minutes.   LOS: 6 days   RAMA,CHRISTINA  [facility] Pager [phone].   *Please note that the hospitalists switch teams on Wednesdays. Please call the flow manager at [phone] if you are having difficulty reaching the hospitalist taking care of this patient as she can update you and provide the most up-to-date pager number of provider caring for the patient. If 8PM-8AM, please contact night-coverage at [email or web address], password [facility]  09/30/2013, 8:34 AM

## 2013-10-01 LAB — BASIC METABOLIC PANEL
BUN: 44 mg/dL — ABNORMAL HIGH (ref 6–23)
Calcium: 8.9 mg/dL (ref 8.4–10.5)
Chloride: 106 mEq/L (ref 96–112)
Creatinine, Ser: 1.35 mg/dL — ABNORMAL HIGH (ref 0.50–1.10)
GFR calc Af Amer: 40 mL/min — ABNORMAL LOW (ref >90)
GFR calc non Af Amer: 34 mL/min — ABNORMAL LOW (ref >90)
Sodium: 140 mEq/L (ref 135–145)

## 2013-10-01 LAB — CBC
HCT: 39 % (ref 36.0–46.0)
MCH: 28.7 pg (ref 26.0–34.0)
MCHC: 31 g/dL (ref 30.0–36.0)
RDW: 16.7 % — ABNORMAL HIGH (ref 11.5–15.5)

## 2013-10-01 LAB — PROTIME-INR
INR: 2.99 — ABNORMAL HIGH (ref 0.00–1.49)
Prothrombin Time: 30 seconds — ABNORMAL HIGH (ref 11.6–15.2)

## 2013-10-01 NOTE — Progress Notes (Signed)
[facility] PROGRESS NOTE   CHESNIE CAPELL AVW:[id number] DOB: [birth year] DOA: 09/24/2013 PCP: Kirstie Peri, MD  Brief narrative: [name] is an 79 y.o. female with a PMH of atrial fibrillation, tachybradycardia syndrome status post pacemaker, who presented to an outside hospital with generalized weakness and dyspnea. She was found to be in atrial fibrillation with rapid ventricular response and started on a diltiazem drip. She was also found to be anemic with a hemoglobin of 7.5. She was transferred to [facility] on 09/24/2013.  Assessment/Plan: Principal Problem:   Atrial fibrillation with RVR Patient was admitted and continued on a diltiazem drip. She was treated with beta blocker, which was increased as blood pressure tolerated. Her atrial fibrillation was thought to be triggered from anemia, so she was transfused with 2 units of packed red blood cells.  INR supratherapeutic on admission, so Coumadin held, but resumed on 09/26/2013. Digoxin and IV Cardizem discontinued by cardiology on 09/28/2013. No recommendations for DC cardioversion. Repeat chest x-ray done 09/29/2013 which showed small pleural effusions. Cleared for discharge by cardiology on 09/29/2013 with recommendations to discharge home on metoprolol at her current dose.  Discharged yesterday, but could not go secondary to insurance authorization issues. Active Problems:   Acute on chronic systolic and diastolic CHF / dyspnea 2-D echo obtained 09/25/2013 and showed an EF of 35-40% with diffuse hypokinesis. Presumed diastolic dysfunction secondary to rapid atrial fibrillation contributory. EF was 65% 07/2011.   CAROTID ARTERY STENOSIS Stable.   PACEMAKER-[employer] / BRADYCARDIA-TACHYCARDIA SYNDROME Pacemaker interrogated by cardiology on 09/27/2013.   Essential hypertension, benign BP controlled.   Acute Anemia Fecal occult blood testing was negative. CT scan of the abdomen and pelvis was done to rule out  retroperitoneal bleed, which was negative.   Hypothyroidism Continue Synthroid.   AKI (acute kidney injury) Thought to be secondary to diuretics. Lasix placed on hold. Baseline creatinine noted to be less than 1 on 09/24/2013.   Acute confusional state Patient had intermittent confusion required sedation with Ativan and Haldol.   Other dysphagia Noted to have trouble swallowing on 09/26/2013. Speech therapy evaluation requested. Seen on 09/27/2013. Diet recommendations: Dysphasia 3.   Aspiration pneumonia Given evidence of dysphasia, patient was started on empiric Levaquin 09/29/2013.   Diarrhea Recently started on PPI, so this was held in case it was contributory. Treated with probiotics and Metamucil. C. difficile PCR negative  Code Status: Full. Family Communication: Husband updated at bedside. Disposition Plan: SNF   IV access:  Peripheral IV  Medical Consultants:  Dr. Lewayne Bunting, Cardiology.  Other Consultants:  Physical therapy: 24 hour supervision versus SNF.  Anti-infectives:  Levaquin 09/29/13--->  HPI/Subjective: [name] feels well today. She is anxious for discharge. She had pain "all over" last evening, but denies any pain at the present time. No complaints of constipation or dyspnea. She continues to have a diminished appetite.  Objective: Filed Vitals:   09/30/13 1446 09/30/13 2013 10/01/13 0417 10/01/13 1026  BP: 149/73 149/65 159/90 142/79  Pulse: 97 98 107 79  Temp: 98.8 F (37.1 C) 98 F (36.7 C) 97.5 F (36.4 C)   TempSrc: Oral Oral Oral   Resp: 18 18 18 18   Height:      Weight:   65.2 kg (143 lb 11.8 oz)   SpO2: 97% 97% 96% 99%    Intake/Output Summary (Last 24 hours) at 10/01/13 1431 Last data filed at 10/01/13 1300  Gross per 24 hour  Intake    520 ml  Output    675 ml  Net   -155 ml    Exam: Gen:  NAD Cardiovascular:  RRR, No M/R/G Respiratory:  Lungs CTAB Gastrointestinal:  Abdomen soft, NT/ND, + BS Extremities:  No  C/E/C  Data Reviewed: Basic Metabolic Panel:  Recent Labs Lab 09/27/13 0359 09/28/13 0530 09/29/13 0415 09/30/13 0420 10/01/13 0500  NA 137 140 141 138 140  K 3.6 4.8 4.5 4.6 4.4  CL 97 107 108 108 106  CO2 30 20 22 22 25   GLUCOSE 102* 93 100* 94 97  BUN 52* 62* 59* 52* 44*  CREATININE 1.44* 1.53* 1.42* 1.35* 1.35*  CALCIUM 8.4 8.6 8.7 8.7 8.9   GFR Estimated Creatinine Clearance: 23.5 ml/min (by C-G formula based on Cr of 1.35). Liver Function Tests:  Recent Labs Lab 09/25/13 0923  AST 13  ALT 6  ALKPHOS 67  BILITOT 0.6  PROT 6.4  ALBUMIN 3.0*   Coagulation profile  Recent Labs Lab 09/27/13 0359 09/28/13 0530 09/29/13 0415 09/30/13 0420 10/01/13 0500  INR 1.86* 2.16* 2.32* 2.77* 2.99*    CBC:  Recent Labs Lab 09/24/13 1625 09/25/13 0923 09/29/13 0415 09/30/13 0420 10/01/13 0500  WBC 6.6 9.6 13.1* 11.2* 16.0*  HGB 7.7* 11.1* 11.7* 11.2* 12.1  HCT 25.0* 35.1* 37.9 35.4* 39.0  MCV 90.9 90.5 92.4 91.9 92.6  PLT 80* 89* 68* 53* 53*   CBG:  Recent Labs Lab 09/24/13 1603  GLUCAP 124*   Microbiology Recent Results (from the past 240 hour(s))  CLOSTRIDIUM DIFFICILE BY PCR     Status: None   Collection Time    09/27/13  1:54 AM      Result Value Range Status   C difficile by pcr NEGATIVE  NEGATIVE Final     Procedures and Diagnostic Studies: Ct Abdomen Pelvis Wo Contrast  09/25/2013   CLINICAL DATA:  Left lower quadrant tenderness, anemia, evaluate for retroperitoneal hemorrhage  EXAM: CT ABDOMEN AND PELVIS WITHOUT CONTRAST  TECHNIQUE: Multidetector CT imaging of the abdomen and pelvis was performed following the standard protocol without intravenous contrast.  COMPARISON:  01/11/2008  FINDINGS: Coronary atherosclerosis. Left subclavian pacemaker, incompletely visualized.  Moderate right and small left pleural effusions. Associated lower lobe opacities, likely atelectasis. Mild interstitial edema is suspected (series 4/ image 3).  Unenhanced  liver, pancreas, and adrenal glands are within normal limits.  Splenomegaly, measuring 20.6 cm in craniocaudal dimension.  Status post cholecystectomy. No intrahepatic or extrahepatic ductal dilatation.  Bilateral renal cortical atrophy with multiple renal cysts, including a 2.7 cm lateral left upper pole renal cyst (series 3/ image 31) and a 2.3 cm medial right lower pole renal cyst (series 3/image 40). No renal calculi or hydronephrosis.  No evidence of bowel obstruction. Extensive colonic diverticulosis, without associated inflammatory changes.  Atherosclerotic calcifications of the abdominal aorta and branch vessels.  Small volume abdominopelvic ascites, likely simple. Perihepatic ascites measures just above simple fluid density, although this likely spuriously elevated due to streak artifact from the patient's arms.  Body wall edema.  No evidence of retroperitoneal hemorrhage.  No suspicious abdominopelvic lymphadenopathy.  Bladder is decompressed by indwelling Foley catheter.  Uterus and bilateral ovaries are poorly evaluated.  Tiny right and small left fat containing inguinal hernias. Associated 2.8 x 3.2 cm fluid collection within the left hernia (series 3/image 88).  Degenerative changes of the visualized thoracolumbar spine. Exaggerated lower thoracic kyphosis.  IMPRESSION: No evidence of retroperitoneal hemorrhage.  Splenomegaly, measuring 20.6 cm in craniocaudal dimension.  Moderate right and  small left pleural effusions with possible mild interstitial edema.  Small volume abdominopelvic ascites, simple.  Body wall edema.   Electronically Signed   By: Charline Bills M.D.   On: 09/25/2013 18:14   Dg Chest 1 View  09/27/2013   CLINICAL DATA:  Aspiration  EXAM: CHEST - 1 VIEW  COMPARISON:  09/23/2013.  FINDINGS: Progressive consolidation of the mid to lower lung zones. This may be partially explained by posteriorly layering pleural fluid. Basilar atelectasis/ infiltrates suspected. Pulmonary edema  may contribute to this appearance. No gross pneumothorax.  Sequential pacemaker is in place entering from the left. Leads unchanged in position.  Calcified aorta.  IMPRESSION: Progressive consolidation of the mid to lower lung zones. This may be partially explained by posteriorly layering pleural fluid.  Basilar atelectasis/ infiltrates suspected.  Pulmonary edema may contribute to this appearance.   Electronically Signed   By: Bridgett Larsson M.D.   On: 09/27/2013 14:24   Dg Chest 2 View  09/28/2013   CLINICAL DATA:  Followup CHF.  EXAM: CHEST  2 VIEW  COMPARISON:  09/27/2013  FINDINGS: There is central vascular congestion, hazy perihilar airspace opacity and more confluent lower lung zone opacity, with moderate pleural effusions obscuring the hemidiaphragms. Moderate enlargement of the cardiac silhouette. Mediastinum normal in contour.  Bony thorax is demineralized.  Left anterior chest wall sequential pacemaker is stable in well positioned.  IMPRESSION: Cardiomegaly and moderate bilateral pleural effusions. There is associated lung base opacity that is most likely atelectasis. Hazy perihilar opacities noted on the frontal view is likely due to layering pleural fluid, supported by the lateral view. No overt pulmonary edema.  Allowing for differences in patient positioning and technique, there has been no significant change from the previous day's study.   Electronically Signed   By: Amie Portland M.D.   On: 09/28/2013 13:02   Dg Swallowing Func-speech Pathology  09/27/2013   Lenor Derrick, CCC-SLP     09/27/2013  1:45 PM Objective Swallowing Evaluation:    Patient Details  Name: SAMYUKTHA BRAU MRN: [id number] Date of Birth: 01-Jun-1924  Today's Date: 09/27/2013 Time: 1300-1335 SLP Time Calculation (min): 35 min  Past Medical History:  Past Medical History  Diagnosis Date  . GERD (gastroesophageal reflux disease)   . Carotid stenosis     a. 92011 Carotid u/s: Right 50-69%, left <50%  . Atrial fibrillation     a.  chronic coumadin;  b. 07/2011 Echo: EF 65%, triv MR, mildly  dil LA.  . Tachy-brady syndrome     a. 06/2010: SJM [employer] RF DR model WUJ811 (serial number  [id number])  . Essential hypertension, benign   . Hypothyroidism     a. Dx 09/2013   Past Surgical History:  Past Surgical History  Procedure Laterality Date  . Total knee arthroplasty      Right  . Laparoscopic cholecystectomy    . Pacemaker insertion  06/29/10    SJM implanted by Dr Johney Frame for tachy/brady syndrome   HPI:  Past medical history of atrial fibrillation and tachybradycardia  syndrome status post pacemaker, that went to [facility] because of  dyspnea and generalized weakness which has progressively gotten  worse. She's she was found to be in atrial fibrillation with RVR  was started on diltiazem drip and held no blood pressures in the  90s, also hemoglobin was checked which was 7.5 (previously 10),  due to not having cardiology over the weekend and as per family  request [facility].  She relates she continues to be weak  and mildly short of breath.also some shortness of breath with  minimal exertion.     Assessment / Plan / Recommendation Clinical Impression  Dysphagia Diagnosis: Mild pharyngeal phase dysphagia Clinical impression: Pt. had only a mild pharyngeal dysphagia  characterized by decreased tongue base contraction to the  pharyngeal wall and decreased pharyngeal perestalsis, resulting  residue of mechanical substance in the valleculae, which cleared  with a repeat dry swallow.  The pill lodged in the valleculae and  would not clear with liquids, but did clear with a bite of puree.   The pill also briefly lodged in the distal esophagus, but  cleared with sip of thin liquid.  There was no aspiration or  penetration with any consistency, even with consecutive large  swallows of thin via straw.    Treatment Recommendation  No treatment recommended at this time    Diet Recommendation Dysphagia 3 (Mechanical Soft);Thin liquid   Liquid  Administration via: Straw Medication Administration: Whole meds with puree Supervision: Patient able to self feed;Staff to assist with self  feeding;Full supervision/cueing for compensatory strategies Compensations: Slow rate;Small sips/bites;Follow solids with  liquid Postural Changes and/or Swallow Maneuvers: Seated upright 90  degrees    Other  Recommendations Oral Care Recommendations: Oral care  BID;Staff/trained caregiver to provide oral care Other Recommendations: Clarify dietary restrictions   Follow Up Recommendations  24 hour supervision/assistance    Frequency and Duration        Pertinent Vitals/Pain n/a    SLP Swallow Goals  n/a   General HPI: Past medical history of atrial fibrillation and  tachybradycardia syndrome status post pacemaker, that went to  [facility] because of dyspnea and generalized weakness which has  progressively gotten worse. She's she was found to be in atrial  fibrillation with RVR was started on diltiazem drip and held no  blood pressures in the 90s, also hemoglobin was checked which was  7.5 (previously 10), due to not having cardiology over the  weekend and as per family request to [facility]. She  relates she continues to be weak and mildly short of breath.also  some shortness of breath with minimal exertion. Reason for Referral: Objectively evaluate swallowing function  (r/o silent aspiration) Previous Swallow Assessment: BSE no overt s/s of aspiration, but  pt. reports choking episodes at times and increased respiratory  effort during meals..  Objective eval to r/o silent aspiration. Diet Prior to this Study: NPO Temperature Spikes Noted: No Respiratory Status: Room air History of Recent Intubation: No Behavior/Cognition: Alert;Cooperative;Pleasant mood;Requires  cueing Oral Cavity - Dentition: Missing dentition;Dentures, not  available;Poor condition Oral Motor / Sensory Function: Within functional limits Self-Feeding Abilities: Needs assist Patient Positioning: Upright  in chair Baseline Vocal Quality: Clear Volitional Cough: Weak Volitional Swallow: Able to elicit Anatomy: Within functional limits Pharyngeal Secretions: Not observed secondary MBS    Reason for Referral Objectively evaluate swallowing function (r/o  silent aspiration)   Oral Phase Oral Preparation/Oral Phase Oral Phase: WFL   Pharyngeal Phase Pharyngeal Phase Pharyngeal Phase: Impaired Pharyngeal - Solids Pharyngeal - Regular: Pharyngeal residue - valleculae Pharyngeal - Pill: Pharyngeal residue - valleculae  Cervical Esophageal Phase    GO    Cervical Esophageal Phase Cervical Esophageal Phase: WFL (Pill lodged in distal esophagus,  but cleared with next swall)         Willis, Lori T 09/27/2013, 1:44 PM     Scheduled Meds: . famotidine  20 mg Oral Daily  .  furosemide  20 mg Oral Daily  . levofloxacin  750 mg Oral Q48H  . metoprolol tartrate  100 mg Oral BID  . pneumococcal 23 valent vaccine  0.5 mL Intramuscular Tomorrow-1000  . psyllium  1 packet Oral Daily  . saccharomyces boulardii  250 mg Oral BID  . sodium chloride  3 mL Intravenous Q12H  . Warfarin - Pharmacist Dosing Inpatient   Does not apply q1800   Continuous Infusions:   Time spent: 15 minutes.   LOS: 7 days   RAMA,CHRISTINA  [facility] Pager [phone].   *Please note that the hospitalists switch teams on Wednesdays. Please call the flow manager at [phone] if you are having difficulty reaching the hospitalist taking care of this patient as she can update you and provide the most up-to-date pager number of provider caring for the patient. If 8PM-8AM, please contact night-coverage at [email or web address], password [facility]  10/01/2013, 2:31 PM

## 2013-10-01 NOTE — Progress Notes (Signed)
Pt O3x, in chair on complaints of pain or sob, will continue to monitor

## 2013-10-01 NOTE — Progress Notes (Signed)
Call report to [phone]. Pt D/c to SNF.

## 2013-10-01 NOTE — Progress Notes (Addendum)
ANTICOAGULATION CONSULT NOTE   Pharmacy Consult for Coumadin  Indication: atrial fibrillation  Allergies  Allergen Reactions  . Penicillins Rash    Patient Measurements: Height: 4\' 10"  (147.3 cm) Weight: 143 lb 11.8 oz (65.2 kg) IBW/kg (Calculated) : 40.9  Vital Signs: Temp: 97.5 F (36.4 C) (12/19 0417) Temp src: Oral (12/19 0417) BP: 142/79 mmHg (12/19 1026) Pulse Rate: 79 (12/19 1026)  Labs:  Recent Labs  09/29/13 0415 09/30/13 0420 10/01/13 0500  HGB 11.7* 11.2* 12.1  HCT 37.9 35.4* 39.0  PLT 68* 53* 53*  LABPROT 24.7* 28.3* 30.0*  INR 2.32* 2.77* 2.99*  CREATININE 1.42* 1.35* 1.35*    Estimated Creatinine Clearance: 23.5 ml/min (by C-G formula based on Cr of 1.35).  Assessment: 79 yo F on Coumadin PTA for afib (2.5 mg on MWFSun and  5mg  on TTSat). INR remains therapeutic but trending steadily up to 2.99 despite reduction in dose to 1mg  yesterday. Likely seeing interaction with Levaquin started on 12/17.  H/H improving into wnl and plt low but stable at 53.  Goal of Therapy:  INR 2-3 Monitor platelets by anticoagulation protocol: Yes   Plan:  - Hold Coumadin dose tonight - Daily INR and CBC - monitor for s/s of bleeding - Will follow plt trend closely (?need to hold coumadin if further downward trend)  Shelba Flake. Achilles Dunk, PharmD Clinical Pharmacist - Resident Pager: [phone] Pharmacy: [phone] 10/01/2013 2:29 PM    Addendum ============================= Patient is discharged.  D/w Dr. Darnelle Catalan about plans for Coumadin - She has written orders for resumption of home regimen and daily INR while on Levaquin at outpatient facility.  She suggested communicating with Social Work to make facility aware of Coumadin plan and INR trend, so that facility providers may address prior to tonight's dose.  Relayed message to Case Manager that is communicating with facility for transfer.  Shelba Flake Achilles Dunk, PharmD Clinical Pharmacist - Resident Pager:  [phone] Pharmacy: [phone] 10/01/2013 2:42 PM

## 2013-10-19 ENCOUNTER — Ambulatory Visit: Payer: Self-pay | Admitting: *Deleted

## 2013-10-19 DIAGNOSIS — Z7901 Long term (current) use of anticoagulants: Secondary | ICD-10-CM

## 2013-10-19 DIAGNOSIS — I4891 Unspecified atrial fibrillation: Secondary | ICD-10-CM

## 2013-10-20 ENCOUNTER — Inpatient Hospital Stay
Admission: RE | Admit: 2013-10-20 | Discharge: 2013-12-16 | Disposition: A | Discharge status: Home / Self Care | Payer: Medicare Other | Source: Ambulatory Visit | Attending: Internal Medicine | Admitting: Internal Medicine

## 2013-10-20 DIAGNOSIS — R062 Wheezing: Secondary | ICD-10-CM

## 2013-10-20 DIAGNOSIS — W19XXXA Unspecified fall, initial encounter: Principal | ICD-10-CM

## 2013-10-25 ENCOUNTER — Other Ambulatory Visit (HOSPITAL_COMMUNITY): Payer: Self-pay | Admitting: Hematology and Oncology

## 2013-10-25 ENCOUNTER — Non-Acute Institutional Stay (SKILLED_NURSING_FACILITY): Payer: Medicare HMO | Admitting: Internal Medicine

## 2013-10-25 DIAGNOSIS — I4891 Unspecified atrial fibrillation: Secondary | ICD-10-CM

## 2013-10-25 DIAGNOSIS — I5041 Acute combined systolic (congestive) and diastolic (congestive) heart failure: Secondary | ICD-10-CM

## 2013-10-25 DIAGNOSIS — D696 Thrombocytopenia, unspecified: Secondary | ICD-10-CM | POA: Insufficient documentation

## 2013-10-25 DIAGNOSIS — I509 Heart failure, unspecified: Secondary | ICD-10-CM

## 2013-10-25 DIAGNOSIS — D62 Acute posthemorrhagic anemia: Secondary | ICD-10-CM

## 2013-10-25 NOTE — Progress Notes (Signed)
Patient ID: [name], female   DOB: 1923-11-23, 80 y.o.   MRN: [id number]  Facility; [facility] Chief complaint; admission to SNF post admit to [facility] from  1/5 to 1/7  History; this is a somewhat frail 80 year old woman who apparently lives with her husband in [place]. She was in [place] in December 2014 with anemia and was transfused 2 units of packed cells. She is on Coumadin for a history of atrial fibrillation. There was concern about a retroperitoneal bleed presenting with a hemoglobin of 7.7. CT scan of the abdomen was done that did not show a retroperitoneal bleed but it did document splenomegaly. Stools are guaiac negative repetitively.   She presented to [facility] after presenting I believe to her primary physician's office with weakness, shortness of breath. I believe she was found to be in atrial fibrillation with rapid ventricular response and congestive heart failure. Beyond this I really don't have a sense of what was done. She was noted to have difficulty walking.  A CBC done on 1/6 showed a white count of 10.3, hemoglobin of 10.3 and a platelet count of 40. Her MCV and MCHC were normal. Hi note looking back into her record in December that she also had low platelet counts although not as low as 40. I do not see a previous differential count on her white count. Note also that her BUN is 51, creatinine of 1.72.  Lab work from today shows a white count of 10.3, hemoglobin of 10.2 with a platelet count of 38,000. Her differential count shows 12% granulocytes 81% lymphocytes with an absolute lymph with slight count of 8.4.. INR is 2.73. Basic metabolic panel shows a s, a BUN of 57, creatinine of 1.67 odium of 145.  Past Medical History  Diagnosis Date  . GERD (gastroesophageal reflux disease)   . Carotid stenosis     a. 92011 Carotid u/s: Right 50-69%, left <50%  . Atrial fibrillation     a. chronic coumadin;  b. 07/2011 Echo: EF 65%, triv MR, mildly dil LA.  .  Tachy-brady syndrome     a. 06/2010: SJM Accent RF DR model YNW295 (serial number [id number])  . Essential hypertension, benign   . Hypothyroidism     a. Dx 09/2013   Past Surgical History  Procedure Laterality Date  . Total knee arthroplasty      Right  . Laparoscopic cholecystectomy    . Pacemaker insertion  06/29/10    SJM implanted by Dr Rayann Heman for tachy/brady syndrome   Medications; Benazepril 20 qd,prilosec 20qd, Coumadin 2.5 qd, vit d 1000, imdur 30 qd, lasix 40 mg   reports that she has never smoked. She has never used smokeless tobacco. She reports that she does not drink alcohol. This lady's exact functional status in [place] is unclear  Family History  Problem Relation Age of Onset  . Other      no premature CAD.   Review of systems;  Respiratory; complains of shortness of breath Cardiac; no clear history of chest pain GI no abnormal pain. Apparently she has been noted to have rectal bleeding due to hemorrhoids although I don't see extensive hemorrhoids GU no clear dysuria  Physical examination Gen. very frail 80 year old woman on oxygen at 2 L. Multiple ecchymosis right leg left hand Vitals; pulse rate 87 irregularly irregular respirations 18 could not get my O2 sat to register Respiratory; bronchial breathing at the right lower lobe with crackles left lung is clear Cardiac; probable A. fib  with controlled ventricular response jugular venous pressure is elevated. Widespread edema noted up into her groin area 2-3+ coccyx edema. No murmurs are noted Abdomen; liver is not palpable there is a spleen tip at the left costal margin which is nontender. No tenderness is noted Rectal exam; no masses are noted small external hemorrhoid that was not bleeding stool is guaiac negative Extremities; marked edema below the knees. There is a right total knee replaced. Edema is noted up into her groin. There is no clear evidence of a DVT Neurologic screening; she has antigravity strength in her  legs plantar responses downgoing Mental status; the patient told me she was 80 years old and that she been married for 65 years. Most of her recent history she seems aware of specially her personal history. No clear depression or delirium  Impression/plan #1 congestive heart failure it is noteworthy that the discharge summary for [facility] quotes an echocardiogram done in December as showing an ejection fraction of 60-65% suggesting diastolic heart failure. This seems of August with one done at [facility] also on December. #2 probable chronic ???? lymphocytic leukemia. It does not appear that this has been previously diagnosed. I can't see a differential count dating back through December in [place] [facility]. This would raise the possibility that some of her anemia may be hemolytic. She has splenomegaly and significant thrombocytopenia. I will send a peripheral smear to pathology: And arrange for a diagnostic trip to see a hematologist. We'll check a serum haptoglobin.  #3 atrial fibrillation. Rate is controlled however she is not currently on anything to control it. I will check an EKG on her. The risk of Coumadin here in the face of decreasing platelet count makes me concerned enough to hold the Coumadin at this point. #4 transfusion-dependent anemia. The patient was transfused at [facility] in December. I don't believe an exact diagnosis was ever clarified. As far as I can see her stools have been consistently guaiac negative although the staff report here that she is having rectal bleeding. Once again my rectal exam revealed occult blood negative stool #5 question mild dementia. The patient apparently had a fall which caused head trauma a CT scan of the head has been ordered. I will order a mental status exam  This is a very frail woman. I think she has continued congestive heart failure. The nature of her hematologic problem will need to be clarified whether or not she needs or would qualify for further  treatment  Results for [name], [name] (MRN [id number]) as of 10/25/2013 09:10  Ref. Range 09/24/2013 16:25  Sodium Latest Range: 135-145 mEq/L 136  Potassium Latest Range: 3.5-5.1 mEq/L 4.7  Chloride Latest Range: 96-112 mEq/L 104  CO2 Latest Range: 19-32 mEq/L 22  BUN Latest Range: 6-23 mg/dL 53 (H)  Creatinine Latest Range: 0.50-1.10 mg/dL 2.00 (H)  Calcium Latest Range: 8.4-10.5 mg/dL 8.5  GFR calc non Af Amer Latest Range: >90 mL/min 21 (L)  GFR calc Af Amer Latest Range: >90 mL/min 25 (L)  Glucose Latest Range: 70-99 mg/dL 119 (H)  WBC Latest Range: 4.0-10.5 K/uL 6.6  RBC Latest Range: 3.87-5.11 MIL/uL 2.75 (L)  Hemoglobin Latest Range: 12.0-15.0 g/dL 7.7 (L)  HCT Latest Range: 36.0-46.0 % 25.0 (L)  MCV Latest Range: 78.0-100.0 fL 90.9  MCH Latest Range: 26.0-34.0 pg 28.0  MCHC Latest Range: 30.0-36.0 g/dL 30.8  RDW Latest Range: 11.5-15.5 % 16.7 (H)  Platelets Latest Range: 150-400 K/uL 80 (L)  Sample Expiration No range found  Antibody Identification No range found   Blood Bank Correction No range found   DAT, IgG No range found   Antibody Screen No range found   ABO/RH(D) No range found A POS  Unit Number No range found   Blood Component Type No range found   Unit division No range found   Status of Unit No range found   Transfusion Status No range found   Crossmatch Result No range found      (Report amended )  Patient:    Jeilin, [place] MR #:       [id number] Study Date: 09/25/2013 Gender:     F Age:        32 Height:     147cm Weight:     67kg BSA:        1.54m^2 Pt. Status: Room:       [place], Abraham  ATTENDING    Richardean Sale     Charlynne Cousins  REFERRING    Charlynne Cousins  SONOGRAPHER  Jimmy Reel cc:  ------------------------------------------------------------ LV EF: 35% -   40%  ------------------------------------------------------------ Indications:       Atrial fibrillation - chronic 427.31.   ------------------------------------------------------------ Study Conclusions  - Left ventricle: The cavity size was mildly dilated. Wall   thickness was increased in a pattern of mild LVH. Systolic   function was moderately reduced. The estimated ejection   fraction was in the range of 35% to 40%. Diffuse   hypokinesis. - Mitral valve: Mild regurgitation. - Left atrium: The atrium was moderately to severely   dilated. - Atrial septum: No defect or patent foramen ovale was   identified. - Pulmonary arteries: PA peak pressure: 71mm Hg (S). Transthoracic echocardiography.  M-mode, complete 2D, spectral Doppler, and color Doppler.  Height:  Height: 147cm. Height: 57.9in.  Weight:  Weight: 67kg. Weight: 147.4lb.  Body mass index:  BMI: 31kg/m^2.  Body surface area:    BSA: 1.10m^2.  Blood pressure:     93/50.  Patient status:  Inpatient.  Location:  Bedside.  ------------------------------------------------------------  ------------------------------------------------------------ Left ventricle:  The cavity size was mildly dilated. Wall thickness was increased in a pattern of mild LVH. Systolic function was moderately reduced. The estimated ejection fraction was in the range of 35% to 40%. Diffuse hypokinesis.  ------------------------------------------------------------ Aortic valve:   Mildly calcified leaflets.  Doppler:   There was no stenosis.    No regurgitation.  ------------------------------------------------------------ Mitral valve:   Mildly thickened leaflets .  Doppler:   Mild regurgitation.    Peak gradient: 34mm Hg (D).  ------------------------------------------------------------ Left atrium:  The atrium was moderately to severely dilated.   ------------------------------------------------------------ Atrial septum:  No defect or patent foramen ovale  was identified.  ------------------------------------------------------------ Right ventricle:  The cavity size was normal. Wall thickness was normal. Systolic function was normal.  ------------------------------------------------------------ Pulmonic valve:    Structurally normal valve.   Cusp separation was normal.  Doppler:  Transvalvular velocity was within the normal range.  Trivial regurgitation.  ------------------------------------------------------------ Tricuspid valve:   Structurally normal valve.   Leaflet separation was normal.  Doppler:  Transvalvular velocity was within the normal range.  Mild regurgitation.  ------------------------------------------------------------ Right atrium:  The atrium was normal in size.  ------------------------------------------------------------ Pericardium:  The pericardium was normal in appearance.  EXAM: CHEST  2 VIEW   COMPARISON:  09/27/2013   FINDINGS: There is central vascular  congestion, hazy perihilar airspace opacity and more confluent lower lung zone opacity, with moderate pleural effusions obscuring the hemidiaphragms. Moderate enlargement of the cardiac silhouette. Mediastinum normal in contour.   Bony thorax is demineralized.   Left anterior chest wall sequential pacemaker is stable in well positioned.   IMPRESSION: Cardiomegaly and moderate bilateral pleural effusions. There is associated lung base opacity that is most likely atelectasis. Hazy perihilar opacities noted on the frontal view is likely due to layering pleural fluid, supported by the lateral view. No overt pulmonary edema.   Allowing for differences in patient positioning and technique, there has been no significant change from the previous day's study.     Electronically Signed       PACS Images    Show images for CT Abdomen Pelvis Wo Contrast     Study Result    CLINICAL DATA:  Left lower quadrant tenderness, anemia, evaluate  for retroperitoneal hemorrhage   EXAM: CT ABDOMEN AND PELVIS WITHOUT CONTRAST   TECHNIQUE: Multidetector CT imaging of the abdomen and pelvis was performed following the standard protocol without intravenous contrast.   COMPARISON:  01/11/2008   FINDINGS: Coronary atherosclerosis. Left subclavian pacemaker, incompletely visualized.   Moderate right and small left pleural effusions. Associated lower lobe opacities, likely atelectasis. Mild interstitial edema is suspected (series 4/ image 3).   Unenhanced liver, pancreas, and adrenal glands are within normal limits.   Splenomegaly, measuring 20.6 cm in craniocaudal dimension.   Status post cholecystectomy. No intrahepatic or extrahepatic ductal dilatation.   Bilateral renal cortical atrophy with multiple renal cysts, including a 2.7 cm lateral left upper pole renal cyst (series 3/ image 31) and a 2.3 cm medial right lower pole renal cyst (series 3/image 40). No renal calculi or hydronephrosis.   No evidence of bowel obstruction. Extensive colonic diverticulosis, without associated inflammatory changes.   Atherosclerotic calcifications of the abdominal aorta and branch vessels.   Small volume abdominopelvic ascites, likely simple. Perihepatic ascites measures just above simple fluid density, although this likely spuriously elevated due to streak artifact from the patient's arms.   Body wall edema.   No evidence of retroperitoneal hemorrhage.   No suspicious abdominopelvic lymphadenopathy.   Bladder is decompressed by indwelling Foley catheter.   Uterus and bilateral ovaries are poorly evaluated.   Tiny right and small left fat containing inguinal hernias. Associated 2.8 x 3.2 cm fluid collection within the left hernia (series 3/image 88).   Degenerative changes of the visualized thoracolumbar spine. Exaggerated lower thoracic kyphosis.   IMPRESSION: No evidence of retroperitoneal hemorrhage.    Splenomegaly, measuring 20.6 cm in craniocaudal dimension.   Moderate right and small left pleural effusions with possible mild interstitial edema.   Small volume abdominopelvic ascites, simple.  Body wall edema.     Electronically Signed   By: Julian Hy M.D.   On: 09/25/2013 18:14

## 2013-10-26 ENCOUNTER — Ambulatory Visit (HOSPITAL_COMMUNITY): Payer: Medicare Other

## 2013-10-27 ENCOUNTER — Non-Acute Institutional Stay (SKILLED_NURSING_FACILITY): Payer: Medicare HMO | Admitting: Internal Medicine

## 2013-10-27 DIAGNOSIS — I509 Heart failure, unspecified: Secondary | ICD-10-CM

## 2013-10-27 DIAGNOSIS — I5041 Acute combined systolic (congestive) and diastolic (congestive) heart failure: Secondary | ICD-10-CM

## 2013-10-27 DIAGNOSIS — I4891 Unspecified atrial fibrillation: Secondary | ICD-10-CM

## 2013-10-27 DIAGNOSIS — D696 Thrombocytopenia, unspecified: Secondary | ICD-10-CM

## 2013-10-27 NOTE — Progress Notes (Signed)
Patient ID: [name], female   DOB: [birth year], 80 y.o.   MRN: [id number] [facility]; [facility] Chief complaint; followup multiple medical issues including atrial fibrillation, congestive heart failure, hematologic issue History; this is a patient admitted to the facility 2 days ago coming from [facility] in [place]. She was essentially admitted to hospital there with congestive heart failure and atrial fibrillation with rapid ventricular response. Chest x-ray showed bilateral pleural effusions without pulmonary edema. Lab work in [employer] showed a white count of 10.3, hemoglobin 10.3 and a platelet count of 40,000 on 1/6. Of note she was in hospital at any [facility] hospital in December and was transfused without a clear diagnosis as far as I can tell. She was noted to have lymphocytosis at the time as there was a peripheral smear sent to pathology although I can't see the final thought on the pathology specimen. Regards to her congestive heart failure she had an echocardiogram done in December in [place] that showed an EF of 35-40% {see below].  An EKG I ordered 2 days ago shows atrial fibrillation with a heart rate of around 120. Her blood pressure has apparently been stable. per lab work in the facility from 1/13 showed a BNP of 8155. A BUN of 62 and a creatinine of 1.72 suggesting some degree of chronic renal insufficiency.  With regards to her abnormal CBCs I did ask for a repeat peripheral smear to pathology, although I don't see these results yet. Abnormalities included a hemoglobin of 10.2 [normochromic normocytic] platelet count of 38,000 on 1/12 white count of 10.3 with a lymphocytosis of 81% absolute lymphocyte count of 8400. Out of fear of her overall status I have put her Coumadin on hold. She is certainly a fall risk  Review of systems Respiratory; patient is not complaining of shortness of breath Cardiac no chest pain or palpitations GI no abnormal pain, there was  some reports of rectal bleeding 2 days ago however a rectal exam done at that time showed guaiac-negative stool  Physical examination Gen. patient looks somewhat better than 48 hours ago was short of breath, somewhat more vibrant Vitals; blood pressure 120/80 pulse 120, respirations 18 O2 sat is 94% on room air Respiratory; very shallow air entry I think secondary mostly to severe thoracic kyphosis there is no crackles or wheezes Cardiac, tachycardia which is irregularly irregular without murmurs her JVP is visible at the angle of the jaw at 90. Widespread edema Abdomen; patient is sitting up in a chair I was not able to feel the spleen although I thought I felt a spleen tip but when she was supine 2 days ago. She has no cervical or ancillary adenopathy. There may be a soft supraclavicular node on the right   Impression/plan #1 atrial fibrillation with rapid ventricular response she has a pacemaker for a history of tachybradycardia syndrome. She is going to need something to control her heart rate I'll start her on beta blockers likely Coreg. #2 congestive heart failure, aggravated by #1. She has a history of coronary artery disease although I am not certain that this is active. [See echocardiogram report below] #3 hematologic abnormalities characterized by worsening thrombocytopenia, anemia and lymphocytosis. She has splenomegaly documented on his CT scan done last month at [facility]. She will see hematology tomorrow, I discussed this with her husband they are looking for a diagnosis whether or not there is any particular treatment here to offer.      Patient:    [place],  [name] MR #:       [id number] Study Date: 09/25/2013 Gender:     F Age:        44 Height:     147cm Weight:     67kg BSA:        1.81m^2 Pt. Status: Room:       [place], [name]  ATTENDING    Richardean Sale     Charlynne Cousins  REFERRING     Charlynne Cousins  SONOGRAPHER  Jimmy Reel cc:  ------------------------------------------------------------ LV EF: 35% -   40%  ------------------------------------------------------------ Indications:      Atrial fibrillation - chronic 427.31.   ------------------------------------------------------------ Study Conclusions  - Left ventricle: The cavity size was mildly dilated. Wall   thickness was increased in a pattern of mild LVH. Systolic   function was moderately reduced. The estimated ejection   fraction was in the range of 35% to 40%. Diffuse   hypokinesis. - Mitral valve: Mild regurgitation. - Left atrium: The atrium was moderately to severely   dilated. - Atrial septum: No defect or patent foramen ovale was   identified. - Pulmonary arteries: PA peak pressure: 1mm Hg (S). Transthoracic echocardiography.  M-mode, complete 2D, spectral Doppler, and color Doppler.  Height:  Height: 147cm. Height: 57.9in.  Weight:  Weight: 67kg. Weight: 147.4lb.  Body mass index:  BMI: 31kg/m^2.  Body surface area:    BSA: 1.74m^2.  Blood pressure:     93/50.  Patient status:  Inpatient.  Location:  Bedside.  ------------------------------------------------------------  ------------------------------------------------------------ Left ventricle:  The cavity size was mildly dilated. Wall thickness was increased in a pattern of mild LVH. Systolic function was moderately reduced. The estimated ejection fraction was in the range of 35% to 40%. Diffuse hypokinesis.  ------------------------------------------------------------ Aortic valve:   Mildly calcified leaflets.  Doppler:   There was no stenosis.    No regurgitation.  ------------------------------------------------------------ Mitral valve:   Mildly thickened leaflets .  Doppler:   Mild regurgitation.    Peak gradient: 71mm Hg (D).  ------------------------------------------------------------ Left atrium:  The atrium was  moderately to severely dilated.   ------------------------------------------------------------ Atrial septum:  No defect or patent foramen ovale was identified.  ------------------------------------------------------------ Right ventricle:  The cavity size was normal. Wall thickness was normal. Systolic function was normal.  ------------------------------------------------------------ Pulmonic valve:    Structurally normal valve.   Cusp separation was normal.  Doppler:  Transvalvular velocity was within the normal range.  Trivial regurgitation.  ------------------------------------------------------------ Tricuspid valve:   Structurally normal valve.   Leaflet separation was normal.  Doppler:  Transvalvular velocity was within the normal range.  Mild regurgitation.  ------------------------------------------------------------ Right atrium:  The atrium was normal in size.  ------------------------------------------------------------ Pericardium:  The pericardium was normal in appearance.

## 2013-10-28 ENCOUNTER — Encounter (HOSPITAL_COMMUNITY): Payer: Medicare HMO

## 2013-10-28 ENCOUNTER — Encounter (HOSPITAL_COMMUNITY): Payer: Medicare HMO | Attending: Hematology and Oncology

## 2013-10-28 ENCOUNTER — Encounter (HOSPITAL_COMMUNITY): Payer: Self-pay

## 2013-10-28 VITALS — BP 119/79 | HR 123 | Temp 97.4°F | Resp 22 | Ht <= 58 in | Wt 144.7 lb

## 2013-10-28 DIAGNOSIS — I10 Essential (primary) hypertension: Secondary | ICD-10-CM | POA: Insufficient documentation

## 2013-10-28 DIAGNOSIS — I4891 Unspecified atrial fibrillation: Secondary | ICD-10-CM | POA: Insufficient documentation

## 2013-10-28 DIAGNOSIS — K219 Gastro-esophageal reflux disease without esophagitis: Secondary | ICD-10-CM | POA: Insufficient documentation

## 2013-10-28 DIAGNOSIS — D696 Thrombocytopenia, unspecified: Secondary | ICD-10-CM

## 2013-10-28 DIAGNOSIS — D72820 Lymphocytosis (symptomatic): Secondary | ICD-10-CM

## 2013-10-28 LAB — PROTIME-INR
INR: 2.34 — ABNORMAL HIGH (ref 0.00–1.49)
PROTHROMBIN TIME: 24.9 s — AB (ref 11.6–15.2)

## 2013-10-28 LAB — CBC WITH DIFFERENTIAL/PLATELET
Basophils Absolute: 0 10*3/uL (ref 0.0–0.1)
Basophils Relative: 0 % (ref 0–1)
Eosinophils Absolute: 0 10*3/uL (ref 0.0–0.7)
Eosinophils Relative: 0 % (ref 0–5)
HCT: 32.8 % — ABNORMAL LOW (ref 36.0–46.0)
Hemoglobin: 10.4 g/dL — ABNORMAL LOW (ref 12.0–15.0)
Lymphocytes Relative: 80 % — ABNORMAL HIGH (ref 12–46)
Lymphs Abs: 6.8 10*3/uL — ABNORMAL HIGH (ref 0.7–4.0)
MCH: 31 pg (ref 26.0–34.0)
MCHC: 31.7 g/dL (ref 30.0–36.0)
MCV: 97.6 fL (ref 78.0–100.0)
MONOS PCT: 5 % (ref 3–12)
Monocytes Absolute: 0.4 10*3/uL (ref 0.1–1.0)
Neutro Abs: 1.3 10*3/uL — ABNORMAL LOW (ref 1.7–7.7)
Neutrophils Relative %: 15 % — ABNORMAL LOW (ref 43–77)
PLATELETS: 46 10*3/uL — AB (ref 150–400)
RBC: 3.36 MIL/uL — ABNORMAL LOW (ref 3.87–5.11)
RDW: 21.7 % — AB (ref 11.5–15.5)
WBC: 8.5 10*3/uL (ref 4.0–10.5)

## 2013-10-28 LAB — COMPREHENSIVE METABOLIC PANEL
ALT: 9 U/L (ref 0–35)
AST: 17 U/L (ref 0–37)
Albumin: 3.4 g/dL — ABNORMAL LOW (ref 3.5–5.2)
Alkaline Phosphatase: 71 U/L (ref 39–117)
BILIRUBIN TOTAL: 0.8 mg/dL (ref 0.3–1.2)
BUN: 64 mg/dL — ABNORMAL HIGH (ref 6–23)
CO2: 30 meq/L (ref 19–32)
CREATININE: 1.73 mg/dL — AB (ref 0.50–1.10)
Calcium: 9.3 mg/dL (ref 8.4–10.5)
Chloride: 104 mEq/L (ref 96–112)
GFR calc Af Amer: 29 mL/min — ABNORMAL LOW (ref >90)
GFR, EST NON AFRICAN AMERICAN: 25 mL/min — AB (ref >90)
Glucose, Bld: 90 mg/dL (ref 70–99)
Potassium: 4.8 mEq/L (ref 3.7–5.3)
Sodium: 145 mEq/L (ref 137–147)
Total Protein: 6.6 g/dL (ref 6.0–8.3)

## 2013-10-28 NOTE — Patient Instructions (Addendum)
[place] Discharge Instructions  RECOMMENDATIONS MADE BY THE CONSULTANT AND ANY TEST RESULTS WILL BE SENT TO YOUR REFERRING PHYSICIAN.  EXAM FINDINGS BY THE PHYSICIAN TODAY AND SIGNS OR SYMPTOMS TO REPORT TO CLINIC OR PRIMARY PHYSICIAN: Exam and findings as discussed by Dr. Barnet Glasgow.  Will check some blood work today to sort out what's going on.  MEDICATIONS PRESCRIBED:  none  INSTRUCTIONS/FOLLOW-UP: Return tomorrow at 10:50 for additional blood work. Follow-up in 3 weeks with blood work and office visit  Thank you for choosing [facility] to provide your oncology and hematology care.  To afford each patient quality time with our providers, please arrive at least 15 minutes before your scheduled appointment time.  With your help, our goal is to use those 15 minutes to complete the necessary work-up to ensure our physicians have the information they need to help with your evaluation and healthcare recommendations.    Effective January 1st, 2014, we ask that you re-schedule your appointment with our physicians should you arrive 10 or more minutes late for your appointment.  We strive to give you quality time with our providers, and arriving late affects you and other patients whose appointments are after yours.    Again, thank you for choosing [facility].  Our hope is that these requests will decrease the amount of time that you wait before being seen by our physicians.       _____________________________________________________________  Should you have questions after your visit to [facility], please contact our office at [phone] between the hours of 8:30 a.m. and 5:00 p.m.  Voicemails left after 4:30 p.m. will not be returned until the following business day.  For prescription refill requests, have your pharmacy contact our office with your prescription refill request.

## 2013-10-28 NOTE — Progress Notes (Signed)
[name] presented for labwork. Labs per MD order drawn via Peripheral Line 23 gauge needle inserted in left antecubital.  Good blood return present. Procedure without incident.  Needle removed intact. Patient tolerated procedure well.

## 2013-10-28 NOTE — Progress Notes (Signed)
[place] A. Barnet Glasgow, M.D.  NEW PATIENT EVALUATION   Name: [name] Date: 10/28/2013 MRN: [id number] DOB: January 26, 1924  PCP: RAMA,CHRISTINA, MD   REFERRING PHYSICIAN: Monico Blitz, MD  REASON FOR REFERRAL: Thrombocytopenia     HISTORY OF PRESENT ILLNESS:[name] is a 80 y.o. female who is referred by her family physician because of recent discovery of thrombocytopenia and lymphocytosis on CBC. She is living in assisted living along with a 54 year-old husband. She was found to have low platelets about 2 months ago which persisted in [facility] with an enlarged spleen on CT scan. She admits to easy satiety and feels chilly most of the time period she has periodic palpitations without chest pain or worsening shortness of breath. She does ambulate with a walker. She continues to cope. She denies any nausea, vomiting, diarrhea, constipation, but does have occasional rectal bleeding which has been attributed to hemorrhoids and diverticulosis. She denies any hematuria, incontinence, vaginal bleeding or discharge, but does have chronic lower extremity swelling. She denies any skin rash but does bruise easily. She denies any sore throat, headache, or seizures. Patient has been taken off Coumadin because of low platelet count despite underlying atrial fibrillation.   PAST MEDICAL HISTORY:  has a past medical history of GERD (gastroesophageal reflux disease); Carotid stenosis; Atrial fibrillation; Tachy-brady syndrome; Essential hypertension, benign; and Hypothyroidism.     PAST SURGICAL HISTORY: Past Surgical History  Procedure Laterality Date  . Total knee arthroplasty      Right  . Laparoscopic cholecystectomy    . Pacemaker insertion  06/29/10    SJM implanted by Dr Rayann Heman for tachy/brady syndrome     CURRENT MEDICATIONS: has a current medication list which includes the following prescription(s): benazepril, cholecalciferol,  furosemide, hydrocortisone, isosorbide mononitrate, omeprazole, clotrimazole-betamethasone, famotidine, metoprolol, nitroglycerin, psyllium, saccharomyces boulardii, and warfarin.   ALLERGIES: Penicillins   SOCIAL HISTORY:  reports that she has never smoked. She has never used smokeless tobacco. She reports that she does not drink alcohol.   FAMILY HISTORY: family history includes Other in an other family member.    REVIEW OF SYSTEMS:  Other than that discussed above is noncontributory.    PHYSICAL EXAM:  height is 4\' 5"  (1.346 m) and weight is 144 lb 11.2 oz (65.635 kg). Her oral temperature is 97.4 F (36.3 C). Her blood pressure is 119/79 and her pulse is 123. Her respiration is 22 and oxygen saturation is 96%.    GENERAL:alert, no distress and comfortable SKIN: skin color, texture, turgor are normal, no rashes or significant lesions EYES: normal, Conjunctiva are pink and non-injected, sclera clear OROPHARYNX:no exudate, no erythema and lips, buccal mucosa, and tongue normal  NECK: supple, thyroid normal size, non-tender, without nodularity CHEST: No breast masses. Pacemaker in place. LYMPH:  no palpable lymphadenopathy in the cervical, axillary or inguinal LUNGS: clear to auscultation and percussion with normal breathing effort HEART: Irregularly irregular with no S3. ABDOMEN:abdomen soft, non-tender and normal bowel sounds. Spleen tip palpable. MUSCULOSKELETALl:no cyanosis of digits, no clubbing or edema  NEURO: alert & oriented x 3 with fluent speech, no focal motor/sensory deficits    LABORATORY DATA:   10/01/2014 WBC 16,000 with hemoglobin 12.1 and platelets of 53,000 10/25/2013: WBC 10.3, hemoglobin 10.2, platelets 38,000 with 81% lymphocytes.  Admission on 09/24/2013, Discharged on 10/01/2013  No results displayed because visit has over 200 results.      Urinalysis    Component  Value Date/Time   COLORURINE YELLOW 06/29/2010 1025   APPEARANCEUR CLEAR 06/29/2010  1025   LABSPEC 1.014 06/29/2010 1025   PHURINE 6.0 06/29/2010 1025   GLUCOSEU NEGATIVE 06/29/2010 1025   HGBUR TRACE* 06/29/2010 1025   BILIRUBINUR NEGATIVE 06/29/2010 1025   KETONESUR NEGATIVE 06/29/2010 1025   PROTEINUR NEGATIVE 06/29/2010 1025   UROBILINOGEN 1.0 06/29/2010 1025   NITRITE NEGATIVE 06/29/2010 1025   LEUKOCYTESUR NEGATIVE 06/29/2010 1025      @RADIOGRAPHY : CT Abdomen Pelvis Wo Contrast Status: Final result         PACS Images    Show images for CT Abdomen Pelvis Wo Contrast         Study Result    CLINICAL DATA: Left lower quadrant tenderness, anemia, evaluate for  retroperitoneal hemorrhage  EXAM:  CT ABDOMEN AND PELVIS WITHOUT CONTRAST  TECHNIQUE:  Multidetector CT imaging of the abdomen and pelvis was performed  following the standard protocol without intravenous contrast.  COMPARISON: 01/11/2008  FINDINGS:  Coronary atherosclerosis. Left subclavian pacemaker, incompletely  visualized.  Moderate right and small left pleural effusions. Associated lower  lobe opacities, likely atelectasis. Mild interstitial edema is  suspected (series 4/ image 3).  Unenhanced liver, pancreas, and adrenal glands are within normal  limits.  Splenomegaly, measuring 20.6 cm in craniocaudal dimension.  Status post cholecystectomy. No intrahepatic or extrahepatic ductal  dilatation.  Bilateral renal cortical atrophy with multiple renal cysts,  including a 2.7 cm lateral left upper pole renal cyst (series 3/  image 31) and a 2.3 cm medial right lower pole renal cyst (series  3/image 40). No renal calculi or hydronephrosis.  No evidence of bowel obstruction. Extensive colonic diverticulosis,  without associated inflammatory changes.  Atherosclerotic calcifications of the abdominal aorta and branch  vessels.  Small volume abdominopelvic ascites, likely simple. Perihepatic  ascites measures just above simple fluid density, although this  likely spuriously elevated due to  streak artifact from the patient's  arms.  Body wall edema.  No evidence of retroperitoneal hemorrhage.  No suspicious abdominopelvic lymphadenopathy.  Bladder is decompressed by indwelling Foley catheter.  Uterus and bilateral ovaries are poorly evaluated.  Tiny right and small left fat containing inguinal hernias.  Associated 2.8 x 3.2 cm fluid collection within the left hernia  (series 3/image 88).  Degenerative changes of the visualized thoracolumbar spine.  Exaggerated lower thoracic kyphosis.  IMPRESSION:  No evidence of retroperitoneal hemorrhage.  Splenomegaly, measuring 20.6 cm in craniocaudal dimension.  Moderate right and small left pleural effusions with possible mild  interstitial edema.  Small volume abdominopelvic ascites, simple. Body wall edema.  Electronically Signed  By: Julian Hy M.D.  On: 09/25/2013 18:14      2D Echocardiogram with contrast     Ordering Physician: Charlynne Cousins, MD  Order# [id number] Study Date: 09/25/13      Patient Information     Name MRN  Description    [name] [id number]  80 year old Female             Result Narrative    *[facility]* [address] [place], [place] [address] [phone]  ------------------------------------------------------------ Transthoracic Echocardiography  (Report amended )  [name], [name] MR #: [id number] Study Date: 09/25/2013 Gender: F Age: 43 Height: 147cm Weight: 67kg BSA: 1.45m^2 Pt. Status: Room: [place] Charlynne Cousins ATTENDING Richardean Sale Charlynne Cousins REFERRING Charlynne Cousins SONOGRAPHER Jimmy Reel cc:  ------------------------------------------------------------ LV  EF: 35% - 40%  ------------------------------------------------------------ Indications: Atrial fibrillation - chronic  427.31.  ------------------------------------------------------------ Study Conclusions  - Left ventricle: The cavity size was mildly dilated. Wall thickness was increased in a pattern of mild LVH. Systolic function was moderately reduced. The estimated ejection fraction was in the range of 35% to 40%. Diffuse hypokinesis. - Mitral valve: Mild regurgitation. - Left atrium: The atrium was moderately to severely dilated. - Atrial septum: No defect or patent foramen ovale was identified. - Pulmonary arteries: PA peak pressure: 28mm Hg (S). Transthoracic echocardiography. M-mode, complete 2D, spectral Doppler, and color Doppler. Height: Height: 147cm. Height: 57.9in. Weight: Weight: 67kg. Weight: 147.4lb. Body mass index: BMI: 31kg/m^2. Body surface area: BSA: 1.68m^2. Blood pressure: 93/50. Patient status: Inpatient. Location: Bedside.  ------------------------------------------------------------  ------------------------------------------------------------ Left ventricle: The cavity size was mildly dilated. Wall thickness was increased in a pattern of mild LVH. Systolic function was moderately reduced. The estimated ejection fraction was in the range of 35% to 40%. Diffuse hypokinesis.  ------------------------------------------------------------ Aortic valve: Mildly calcified leaflets. Doppler: There was no stenosis. No regurgitation.  ------------------------------------------------------------ Mitral valve: Mildly thickened leaflets . Doppler: Mild regurgitation. Peak gradient: 13mm Hg (D).  ------------------------------------------------------------ Left atrium: The atrium was moderately to severely dilated.  ------------------------------------------------------------ Atrial septum: No defect or patent foramen ovale was identified.  ------------------------------------------------------------ Right ventricle: The cavity size was normal. Wall thickness was normal.  Systolic function was normal.  ------------------------------------------------------------ Pulmonic valve: Structurally normal valve. Cusp separation was normal. Doppler: Transvalvular velocity was within the normal range. Trivial regurgitation.  ------------------------------------------------------------ Tricuspid valve: Structurally normal valve. Leaflet separation was normal. Doppler: Transvalvular velocity was within the normal range. Mild regurgitation.  ------------------------------------------------------------ Right atrium: The atrium was normal in size.  ------------------------------------------------------------ Pericardium: The pericardium was normal in appearance.  ------------------------------------------------------------  2D measurements Normal Doppler measurements Normal Left ventricle Main pulmonary LVID ED, 42.3 mm 43-52 artery chord, Pressure, S 39 mm =30 PLAX Hg LVID ES, 34.9 mm 23-38 Mitral valve chord, Peak E vel 134 cm/s ------ PLAX Peak 7 mm ------ FS, chord, 17 % >29 gradient, D Hg PLAX Tricuspid valve LVPW, ED 12.2 mm ------ Regurg peak 267 cm/s ------ IVS/LVPW 1.12 <1.3 vel ratio, ED Peak RV-RA 29 mm ------ Ventricular septum gradient, S Hg IVS, ED 13.7 mm ------ Max regurg 267 cm/s ------ Aorta vel Root diam, 32 mm ------ Systemic veins ED Estimated 10 mm ------ Left atrium CVP Hg AP dim 45 mm ------ Right ventricle AP dim 2.81 cm/m^2 <2.2 Pressure, S 39 mm <30 index Hg  ------------------------------------------------------------ Mortimer Fries, Peter 2014-12-13T16:49:27.120        PATHOLOGY: Peripheral smear failed to reveal evidence of premature forms or platelet clumping. Mature lymphocytosis.  IMPRESSION:  #1. Probable chronic lymphocytic leukemia with thrombocytopenia, anemia, and lymphocytosis. #2. Left ventricular heart failure with ejection fraction of 40%, status post pacemaker for tachybradycardia syndrome,  currently in atrial fibrillation, no longer on warfarin. #3. Gastroesophageal reflux disease, on treatment.   PLAN:  #1. Peripheral blood sent for flow cytometry along with ZAP-70. With anemia thrombocytopenia and splenomegaly treatment should probably be administered, probably oral chlorambucil alone or chlorambucil plus Obinutuzumab. Alternative intervention would be prednisone which is lympholytic as well as therapeutic if there exists an element of immune thrombocytopenia. #2. No specific therapy for today prior to the definitive diagnosis being made.. #3. Followup in 3 weeks with CBC at which time discussion of findings and recommendations will be made.  I appreciate the opportunity of sharing in her care.  Doroteo Bradford, MD 10/28/2013 2:02 PM

## 2013-10-28 NOTE — Addendum Note (Signed)
Addended by: Mellissa Kohut on: 10/28/2013 04:17 PM   Modules accepted: Orders

## 2013-10-28 NOTE — Addendum Note (Signed)
Addended by: Mellissa Kohut on: 10/28/2013 03:47 PM   Modules accepted: Orders

## 2013-10-29 ENCOUNTER — Encounter (HOSPITAL_BASED_OUTPATIENT_CLINIC_OR_DEPARTMENT_OTHER): Payer: Medicare HMO

## 2013-10-29 ENCOUNTER — Ambulatory Visit (HOSPITAL_COMMUNITY)
Admit: 2013-10-29 | Discharge: 2013-10-29 | Disposition: A | Discharge status: Home / Self Care | Payer: Medicare HMO | Source: Ambulatory Visit | Attending: Internal Medicine | Admitting: Internal Medicine

## 2013-10-29 ENCOUNTER — Non-Acute Institutional Stay (SKILLED_NURSING_FACILITY): Payer: Medicare HMO | Admitting: Internal Medicine

## 2013-10-29 DIAGNOSIS — R059 Cough, unspecified: Secondary | ICD-10-CM

## 2013-10-29 DIAGNOSIS — I4891 Unspecified atrial fibrillation: Secondary | ICD-10-CM

## 2013-10-29 DIAGNOSIS — D72820 Lymphocytosis (symptomatic): Secondary | ICD-10-CM

## 2013-10-29 DIAGNOSIS — R05 Cough: Secondary | ICD-10-CM

## 2013-10-29 DIAGNOSIS — R5383 Other fatigue: Secondary | ICD-10-CM

## 2013-10-29 DIAGNOSIS — D696 Thrombocytopenia, unspecified: Secondary | ICD-10-CM

## 2013-10-29 DIAGNOSIS — N289 Disorder of kidney and ureter, unspecified: Secondary | ICD-10-CM

## 2013-10-29 DIAGNOSIS — C9110 Chronic lymphocytic leukemia of B-cell type not having achieved remission: Secondary | ICD-10-CM

## 2013-10-29 DIAGNOSIS — G319 Degenerative disease of nervous system, unspecified: Secondary | ICD-10-CM | POA: Insufficient documentation

## 2013-10-29 DIAGNOSIS — D649 Anemia, unspecified: Secondary | ICD-10-CM

## 2013-10-29 DIAGNOSIS — R5381 Other malaise: Secondary | ICD-10-CM | POA: Insufficient documentation

## 2013-10-29 LAB — VITAMIN B12: VITAMIN B 12: 235 pg/mL (ref 211–911)

## 2013-10-29 LAB — FOLATE: Folate: 16 ng/mL

## 2013-10-29 NOTE — Progress Notes (Signed)
Labs drawn today for flow,ana,v12,folate

## 2013-10-29 NOTE — Progress Notes (Signed)
Patient ID: [name], female   DOB: 01-16-1924, 80 y.o.   MRN: [id number]   This is an acute visit.  Level of care skilled.  Facility [facility].   Complaint-acute visit followup A. fib with rapid ventricular rate-anemia.  History of present illness.  Patient is a pleasant 80 year old female with a complex medical history including recent hospitalization for atrial fibrillation with rapid ventricular rate this was at [facility] was treated at appears without beta blocker although she was this was discontinued at some point apparently at some point before she came here.  Nonetheless Dr. Dellia Nims did restart her on Coreg secondary to concerns of tachycardia here earlier in the week and this appears to be stable -recent pulses 89 and 102 recent blood pressures 101/70-115/74.  Another issue has been hematological--apparently she has been transfused without a clear diagnosis for document review.  She was noted to have lymphocytosis at that time and a pathology peripheral smear was done apparently all the results aren't available-Dr. Dellia Nims did reorder this.  Her hemoglobin has run in the low tens-updated hemoglobin today was 9.9 with platelets of 46,000 which actually h baseline for her.  She did see hematology who diagnosed her earlier this week with CLL-at this point they do not feel she needs treatment.  Of note Dr. Dellia Nims did order her stools to be guaiac and one has come back positive-.  Earlier he did hold her Coumadin secondary to concerns of her thrombocytopenia-also apparently some history of rectal bleeding in the past although guaics apparently have been negative recentl paty she does continue on a proton pump inhibitor  She also has a history of diastolic CHF is on Lasix 40 mg a day-this is complicated with history of renal insufficiency creatinine today is 1.73 with a BUN of 65 this also appears relatively baseline lab done earlier this week.  Today she says she feels  much better than she has felt since she came here---I spoke with her husband by phone as well who concurs.  Family medical social history reviewed per previous progress notes including 10/25/2012.  Medications have been reviewed per MAR.  Review of systems.  In general denies any fever chills or weight appears to be stable to slightly reduced since admission.  Skin denies any rash or itching.  Does have some mild bruising.  Respiratory does not complaining of any shortness of breath has occasional cough.  Cardiac does not complain of chest pain has history of CHF and atrial fibrillation denies any palpitations.  GI-does not complain of any nausea vomiting diarrhea or constipation.  Muscle skeletal does not complaining of joint pain.  Physical exam.  Temperature is 97.4 pulse 93 respirations 18 blood pressure 101/70-115/74-weight is 143.8 this appears down about 2 pounds since admission about a week ago.  In general this is a pleasant elderly female who does not appear to be in any distress lying comfortably in bed.  Her skin is warm and dry she does have some what appears chronic bruising of the extremities.  Oropharynx clear mucous membranes moist.  Chest she has somewhat shallow air entry there is some bronchial rhonchi sounds on expiration but no labored breathing.  Heart is uric alert a regular rate and rhythm I did not variable from the 80s to 100.  She has lower extremity edema I would say 2+ lower according to her and her husband this is improved from that was earlier this week.  Abdomen is soft nontender positive bowel sounds.  Muscle  skeletal moves all extremities x4 she ambulates in a wheelchair.  Neurologic as I cannot appreciate any focal deficits her speech is clear.  Labs.  10/29/2013.  CBC 9.3 hemoglobin 9.9 platelets 46.  Sodium 145 potassium 4.5 BUN 65 creatinine 1.73.  Assessment and plan.  #1-atrial fibrillation with rapid ventricular  response-this appears to be doing a bit better continue to monitor vital signs pulse ox every shift-she says she feels better.  #2-hematological abnormalities-with history of thrombocytopenia anemia and lymphocytosis-she has been seen by hematology and diagnosed with CLL at this point they have recommended no aggressive therapy-her blood work appears to be relatively baseline although she did have a guaiac positive stool-her Coumadin is currently on hold she is on a proton pump inhibitor continue to guaiac stools and we'll recheck  CBC this on Monday.  #3-history CHF-clinically she says she feels better although she still has significant edema she is on Lasix-this is complicated with what appears to be baseline renal insufficiency-Will update a metabolic panel as well next week continue to monitor weights.  #4-cough-will order Mucinex also there appears to be some mild chest congestion we'll order a chest x-ray tomorrow morning continue to monitor vital signs pulse ox every shift  [id number]        .

## 2013-10-30 ENCOUNTER — Inpatient Hospital Stay (HOSPITAL_COMMUNITY): Payer: Medicare HMO | Attending: Internal Medicine

## 2013-10-30 ENCOUNTER — Non-Acute Institutional Stay (SKILLED_NURSING_FACILITY): Payer: Medicare HMO | Admitting: Internal Medicine

## 2013-10-30 DIAGNOSIS — I5022 Chronic systolic (congestive) heart failure: Secondary | ICD-10-CM

## 2013-10-30 DIAGNOSIS — I4891 Unspecified atrial fibrillation: Secondary | ICD-10-CM

## 2013-10-30 DIAGNOSIS — I509 Heart failure, unspecified: Secondary | ICD-10-CM

## 2013-10-30 NOTE — Progress Notes (Signed)
Patient ID: [name], female   DOB: Jun 22, 1924, 80 y.o.   MRN: [id number]   This is an acute visit.  Level of care skilled.  Facility [facility].  Chief complaint-acute visit secondary to followup chest congestion cough yesterday also atrial fibrillation.  History of present illness.  Patient is a very pleasant 80 year old female with complex medical history including a recent hospitalization for atrial fibrillation with rapid ventricular rate.  She was not apparently discharged on a beta blocker to his rate was controlled however she should start to have tachycardic episodes and Dr. Ellwood Dense did start her on a beta blocker Coreg 6.25 mg twice a day-this appears to have helped her recent pulses to. Be largely in the 80s up to occasionally just over 100 I got 100 on exam today earlier nursing got 94-see list one of 118 and also one of 52 although I suspect these were machine recorded-will write order to make sure this is checked manually. For accurate documentation.  She also had a chest x-ray ordered secondary to concerns of some possible chest congestion last night-it showed findings consistent with CHF with pulmonary interstitial edema and bilateral pleural effusions overall not a dramatic change since previous study.  She is on Lasix 40 mg a day for history of CHF-on-call provider was notified earlier today about the chest x-ray results and added Lasix 20 mg for tonight-as well as lab work including a BMP BNP and CBC on Monday.  Her vital signs continued to be stable O2 saturation is in the 90s on room air she denies any increase shortness of breath-she had somewhat of a cough yesterday and I did start her on Mucinex apparently this is a bit better today.  Family medical social history as been reviewed her previous progress notes including January 12 of 10/29/2012.  Medications have been reviewed per [facility] of note she does continue on an ACE inhibitor in addition to the beta blocker and  diuretic.  Review of systems.  General she is denying any fever or chills.  Respiratory no complaints of shortness of breath does have cough occasionally.  Cardiac-no complaints of chest pain-does continue with lower extremity edema although according to nursing inpatient this continues to improve.  GI no complaints of nausea vomiting diarrhea constipation.  Neurologic does not complaining of any headache or dizziness or syncopal-type feelings  Physical exam.  Temperature is 98.6 pulse I got 102 on exam had been 94 earlier today that blood pressure 124/80 saturation is in the 90s on room air weight is 144.9 this appears to be relatively stable with her admission weight.  In general this is a pleasant elderly female in no distress resting comfortably in bed.  Her skin is warm and dry.  Chest continues with somewhat shallow air entry but congestion appears improved from yesterday some bronchial sounds.  No labored breathing  Heart-is irregular irregular rate and rhythm without murmur gallop or rub.  She continues with baseline lower extremity edemaI would say 2+ doesn't appear all that changed from yesterday but apparently has improved since her admission  Abdomen is soft nontender positive bowel sounds.  Labs.  10/26/2013.  Sodium 142 potassium 4.5 BUN 62 creatinine 1.7 to liver function tests within normal limits except albumin of 3.2.  BNP is 8155.  Assessment and plan.  #1-chest congestion with history of CHF-actually congestion sounds a bit better today she is on Mucinex.  X-rays showed what appeared to be fairly chronic findings of CHF pulmonary interstitial edema and  bilateral pleural effusions-showed the pulmonary interstitial markings mildly increased although stable.  At this point we'll continue the p.m. Lasix tonight and   and tomorrow night as well-will try to be judicious with her history of renal insufficiency-clinically she appears to be stable--- we'll see  what the labs bring [place] on Monday before continuing the increased dose of Lasix again she continues on 20 mg at night for 2 nights-in addition to 40 mg q. A.m.  #2-atrial fibrillation-at this point appears to be rate controlled with occasional readings slightly above 100 continue to monitor will write order to check her pulse manually apically  [id number].

## 2013-11-01 ENCOUNTER — Non-Acute Institutional Stay (SKILLED_NURSING_FACILITY): Payer: Medicare HMO | Admitting: Internal Medicine

## 2013-11-01 DIAGNOSIS — I5022 Chronic systolic (congestive) heart failure: Secondary | ICD-10-CM

## 2013-11-01 DIAGNOSIS — I509 Heart failure, unspecified: Secondary | ICD-10-CM

## 2013-11-01 DIAGNOSIS — I4891 Unspecified atrial fibrillation: Secondary | ICD-10-CM

## 2013-11-01 DIAGNOSIS — C9110 Chronic lymphocytic leukemia of B-cell type not having achieved remission: Secondary | ICD-10-CM

## 2013-11-01 LAB — ANTIPHOSPHOLIPID SYNDROME EVAL, BLD
ANTICARDIOLIPIN IGG: 20 GPL U/mL (ref <23)
Anticardiolipin IgA: 61 APL U/mL — ABNORMAL HIGH (ref <22)
Anticardiolipin IgM: 81 MPL U/mL — ABNORMAL HIGH (ref <11)
DRVVT: 86.9 secs — ABNORMAL HIGH (ref <42.9)
Drvvt confirmation: 1.12 Ratio — ABNORMAL HIGH (ref <1.11)
LUPUS ANTICOAGULANT: DETECTED — AB
PTT Lupus Anticoagulant: 63.9 secs — ABNORMAL HIGH (ref 28.0–43.0)
PTTLA 41 MIX: 55.4 s — AB (ref 28.0–43.0)
PTTLA Confirmation: 10.8 secs — ABNORMAL HIGH (ref <8.0)
Phosphatydalserine, IgA: 19 U/mL (ref <20)
Phosphatydalserine, IgG: 13 U/mL (ref <16)
Phosphatydalserine, IgM: >80 U/mL — ABNORMAL HIGH (ref <22)
dRVVT Incubated 1:1 Mix: 70.2 secs — ABNORMAL HIGH (ref <42.9)

## 2013-11-01 LAB — ANA: ANA: NEGATIVE

## 2013-11-03 LAB — ZAP-70
Viability (ZAP70): 90 %
ZAP-70: 3 % (ref <10)

## 2013-11-16 NOTE — Progress Notes (Addendum)
Patient ID: [name], female   DOB: 1923-12-23, 80 y.o.   MRN: [id number]                PROGRESS NOTE  DATE:  11/01/2013    FACILITY: [facility]    LEVEL OF CARE:   SNF   Acute Visit   CHIEF COMPLAINT:  Follow up medical issues including congestive heart failure, probable chronic lymphocytic leukemia.    HISTORY OF PRESENT ILLNESS:  [name] is a lady who came here after a stay at [facility] earlier this month.  She was treated for congestive heart failure and quoted as having a normal EF, although this seems at odds with an echocardiogram done at [facility] in December at which time her ejection fraction was 35-40% with diffuse hypokinesis.    It was also noted that she has transfusion-dependent anemia and was transfused at [facility] in December, although I do not think an exact diagnosis was ever made.  She arrived to [place] with a hemoglobin of 10.5, a platelet count of 46,000, and a differential count showing 6% granulocytes and 90% lymphocytes.    She also has chronic renal insufficiency with a BUN of 68 and a creatinine of 1.59.    LABORATORY DATA:  Today's lab work shows a white count of 12.3 with 6% granulocytes, 90% lymphocytes, an absolute lymphocyte count of 11,200.  The platelet count is 46,000.    BUN 68, creatinine 1.59.    Her pro-BNP is 9,864.    PHYSICAL EXAMINATION:   VITAL SIGNS:   WEIGHT:  She has lost roughly 6 lbs since admission, currently at 140.7 lbs.   PULSE:  Her pulse rate seems to run in the mid to high 80s.   GENERAL APPEARANCE:  The patient does not look to be in any distress.     CHEST/RESPIRATORY:  Clear air entry bilaterally.   CARDIOVASCULAR:  CARDIAC:  Pulse rate 98, atrial fibrillation.  Jugular venous pressure is not elevated.   EDEMA/VARICOSITIES:  She still has considerable lower extremity edema and considerable coccyx edema.    ASSESSMENT/PLAN:  Congestive heart failure with a probable ischemic cardiomyopathy.   She has lost weight.  Her lungs are clear.  In spite of the elevated BNP, I am going to stay the course with her current dose of Lasix at 40 mg, although I will follow this carefully.  She also has some degree of chronic renal insufficiency.    Chronic lymphocytic leukemia.   The diagnostic studies have been sent by Hematology.  Therapeutic options will be discussed with them at follow-up in 2-3 weeks.    Atrial fibrillation.  Her rate is controlled.  I took her off the Coumadin out of fear of her platelet count being low.  I really do not feel strongly right now about restarting this.

## 2013-11-17 ENCOUNTER — Non-Acute Institutional Stay (SKILLED_NURSING_FACILITY): Payer: Medicare HMO | Admitting: Internal Medicine

## 2013-11-17 DIAGNOSIS — I509 Heart failure, unspecified: Secondary | ICD-10-CM

## 2013-11-17 DIAGNOSIS — I4891 Unspecified atrial fibrillation: Secondary | ICD-10-CM

## 2013-11-17 DIAGNOSIS — C9110 Chronic lymphocytic leukemia of B-cell type not having achieved remission: Secondary | ICD-10-CM

## 2013-11-17 DIAGNOSIS — I5022 Chronic systolic (congestive) heart failure: Secondary | ICD-10-CM

## 2013-11-17 NOTE — Progress Notes (Signed)
Patient ID: [name], female   DOB: 06-08-1924, 80 y.o.   MRN: [id number]                 PROGRESS NOTE  DATE:  11/17/13    FACILITY: [place]    LEVEL OF CARE:   SNF   Acute Visit   CHIEF COMPLAINT:  Follow up medical issues including congestive heart failure, probable chronic lymphocytic leukemia.    HISTORY OF PRESENT ILLNESS:  [name] is a lady who came here after a stay at [facility] last month.  She was treated for congestive heart failure and quoted as having a normal EF, although this seems at odds with an echocardiogram done at [facility] in December at which time her ejection fraction was 35-40% with diffuse hypokinesis.    It was also noted that she has transfusion-dependent anemia and was transfused at [facility] in December, although I do not think an exact diagnosis was ever made.  She arrived to [place] with a hemoglobin of 10.5, a platelet count of 46,000, and a differential count showing 6% granulocytes and 90% lymphocytes.    Last lab work on 1/26 showed a sodium of 145 BUN of 55 a creatinine of 1.47. Her white count was 10.3 hemoglobin 10.6 platelet count 52 to she has had at least one fecal occult blood that was positive.    LABORATORY DATA:  last lab work shows a white count of 12.3 with 6% granulocytes, 90% lymphocytes, an absolute lymphocyte count of 11,200.  The platelet count is 46,000.     Her pro-BNP is 9,864.    PHYSICAL EXAMINATION:   VITAL SIGNS:  Respiratory 18 she is in no distress WEIGHT:  She has lost roughly 6 lbs since admission, currently at 141.7 although she appears to a put on some weight recent PULSE:  Her pulse rate seems to run in the mid to high 80s. Atrial fibrillator GENERAL APPEARANCE:  The patient does not look to be in any distress.     CHEST/RESPIRATORY:  Clear air entry bilaterally.   CARDIOVASCULAR:  CARDIAC:  Pulse rate 98, atrial fibrillation.  Jugular venous pressure is visible at 90    EDEMA/VARICOSITIES:  She still has considerable lower extremity edema however I did not appreciate any coccyx edema  ASSESSMENT/PLAN:  Congestive heart failure with a probable ischemic cardiomyopathy.  She has lost weight.  Her lungs are clear.  In spite of the elevated BNP, I am going to increase her Lasix to 60 mg a day from 40, although I will follow this carefully.  She also has some degree of chronic renal insufficiency.    Chronic lymphocytic leukemia.   The diagnostic studies have been sent by Hematology.  Therapeutic options will be discussed with them at follow-up in 2-3 weeks.    Atrial fibrillation.  Her rate is controlled.  I took her off the Coumadin out of fear of her platelet count being low.  I really do not feel strongly right now about restarting.  We'll update her BUN and creatinine as well as her CBC next week. I am not exactly sure when her hematology appointment i

## 2013-11-18 ENCOUNTER — Other Ambulatory Visit (HOSPITAL_COMMUNITY): Payer: Medicare HMO

## 2013-11-18 ENCOUNTER — Encounter (HOSPITAL_BASED_OUTPATIENT_CLINIC_OR_DEPARTMENT_OTHER): Payer: Medicare HMO

## 2013-11-18 ENCOUNTER — Encounter (HOSPITAL_COMMUNITY): Payer: Medicare HMO | Attending: Hematology and Oncology

## 2013-11-18 ENCOUNTER — Encounter (HOSPITAL_COMMUNITY): Payer: Self-pay

## 2013-11-18 VITALS — BP 106/61 | HR 115 | Temp 97.3°F | Resp 16

## 2013-11-18 DIAGNOSIS — D72820 Lymphocytosis (symptomatic): Secondary | ICD-10-CM | POA: Insufficient documentation

## 2013-11-18 DIAGNOSIS — C8307 Small cell B-cell lymphoma, spleen: Secondary | ICD-10-CM

## 2013-11-18 DIAGNOSIS — D696 Thrombocytopenia, unspecified: Secondary | ICD-10-CM

## 2013-11-18 DIAGNOSIS — C9110 Chronic lymphocytic leukemia of B-cell type not having achieved remission: Secondary | ICD-10-CM | POA: Insufficient documentation

## 2013-11-18 DIAGNOSIS — D649 Anemia, unspecified: Secondary | ICD-10-CM

## 2013-11-18 LAB — CBC WITH DIFFERENTIAL/PLATELET
BASOS ABS: 0 10*3/uL (ref 0.0–0.1)
Basophils Relative: 0 % (ref 0–1)
Eosinophils Absolute: 0.1 10*3/uL (ref 0.0–0.7)
Eosinophils Relative: 1 % (ref 0–5)
HEMATOCRIT: 30.9 % — AB (ref 36.0–46.0)
Hemoglobin: 9.9 g/dL — ABNORMAL LOW (ref 12.0–15.0)
LYMPHS PCT: 77 % — AB (ref 12–46)
Lymphs Abs: 5 10*3/uL — ABNORMAL HIGH (ref 0.7–4.0)
MCH: 32.2 pg (ref 26.0–34.0)
MCHC: 32 g/dL (ref 30.0–36.0)
MCV: 100.7 fL — ABNORMAL HIGH (ref 78.0–100.0)
MONOS PCT: 6 % (ref 3–12)
Monocytes Absolute: 0.4 10*3/uL (ref 0.1–1.0)
Neutro Abs: 1 10*3/uL — ABNORMAL LOW (ref 1.7–7.7)
Neutrophils Relative %: 16 % — ABNORMAL LOW (ref 43–77)
PLATELETS: 43 10*3/uL — AB (ref 150–400)
RBC: 3.07 MIL/uL — ABNORMAL LOW (ref 3.87–5.11)
RDW: 20.3 % — ABNORMAL HIGH (ref 11.5–15.5)
WBC: 6.5 10*3/uL (ref 4.0–10.5)

## 2013-11-18 LAB — RETICULOCYTES
RBC.: 3.07 MIL/uL — AB (ref 3.87–5.11)
RETIC CT PCT: 2 % (ref 0.4–3.1)
Retic Count, Absolute: 61.4 10*3/uL (ref 19.0–186.0)

## 2013-11-18 NOTE — Progress Notes (Signed)
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 Splenic lymphoma    CURRENT THERAPY: Undergoing workup to diagnose lymphocytosis with normal total white cell count with thrombocytopenia.  INTERVAL HISTORY: [name] 80 y.o. female returns for followup of peripheral blood lymphocytosis with normal total white cell count in the setting of splenomegaly.  She is presently without complaints. She denies any fever, night sweats, severe fatigue, diarrhea, constipation, cough, wheezing, easy satiety, night sweats, headache, or shortness of breath. She did require 3 units of red cells in December of 2014 and 3 weeks ago was taken off warfarin because of severe ecchymoses. Etiology of her anemia was never determined.  MEDICAL HISTORY: Past Medical History  Diagnosis Date  . GERD (gastroesophageal reflux disease)   . Carotid stenosis     a. 92011 Carotid u/s: Right 50-69%, left <50%  . Atrial fibrillation     a. chronic coumadin;  b. 07/2011 Echo: EF 65%, triv MR, mildly dil LA.  . Tachy-brady syndrome     a. 06/2010: SJM [employer] RF DR model [id number] (serial number [id number])  . Essential hypertension, benign   . Hypothyroidism     a. Dx 09/2013    INTERIM HISTORY: has FIBRILLATION, ATRIAL; BRADYCARDIA-TACHYCARDIA SYNDROME; CAROTID ARTERY STENOSIS; PACEMAKER-[employer]; Essential hypertension, benign; Encounter for long-term (current) use of anticoagulants; Atrial fibrillation with RVR; Anemia; Dyspnea; Hypothyroidism; AKI (acute kidney injury); Acute confusional state; Other dysphagia; Aspiration pneumonia; Thrombocytopenia; CLL (chronic lymphocytic leukemia); and Splenic marginal zone b-cell lymphoma on her problem list.    ALLERGIES:  is allergic to penicillins.  MEDICATIONS: has  a current medication list which includes the following prescription(s): benazepril, cholecalciferol, clotrimazole-betamethasone, furosemide, hydrocortisone, isosorbide mononitrate, omeprazole, and warfarin.  SURGICAL HISTORY:  Past Surgical History  Procedure Laterality Date  . Total knee arthroplasty      Right  . Laparoscopic cholecystectomy    . Pacemaker insertion  06/29/10    SJM implanted by Dr Rayann Heman for tachy/brady syndrome    FAMILY HISTORY: family history includes Other in an other family member.  SOCIAL HISTORY:  reports that she has never smoked. She has never used smokeless tobacco. She reports that she does not drink alcohol.  REVIEW OF SYSTEMS:  Other than that discussed above is noncontributory.  PHYSICAL EXAMINATION: ECOG PERFORMANCE STATUS: 2 - Symptomatic, <50% confined to bed  There were no vitals taken for this visit.  GENERAL:alert, no distress and comfortable SKIN: skin color, texture, turgor are normal, no rashes or significant lesions EYES: PERLA; Conjunctiva are pink and non-injected, sclera clear OROPHARYNX:no exudate, no erythema on lips, buccal mucosa, or tongue. NECK: supple, thyroid normal size, non-tender, without nodularity. No masses CHEST: Normal AP diameter with no breast masses. LYMPH:  no palpable lymphadenopathy in the cervical, axillary or inguinal LUNGS: clear to auscultation and percussion with normal breathing effort HEART: Irregularly irregular with no S3.. ABDOMEN:abdomen soft, non-tender and normal bowel sounds. Spleen tip palpable. MUSCULOSKELETAL:no cyanosis of digits and no clubbing. Range of motion normal.  NEURO: alert & oriented x 3 with fluent speech, no focal motor/sensory deficits   LABORATORY DATA: Infusion on 10/29/2013  Component Date Value Range Status  . ANA 10/29/2013 NEGATIVE  NEGATIVE Final   Performed at [facility]  . Vitamin B-12 10/29/2013 235  211 - 911  pg/mL Final   Performed at [facility]  . Folate 10/29/2013 16.0   Final   Comment: (NOTE)                          Reference Ranges                                 Deficient:       0.4 - 3.3 ng/mL                                 Indeterminate:   3.4 - 5.4 ng/mL                                 Normal:              > 5.4 ng/mL                          Performed at [facility] Visit on 10/28/2013  Component Date Value Range Status  . Anticardiolipin IgA 10/28/2013 61* <22 APL U/mL Final   Comment: (NOTE)                          Reference Range:  Cardiolipin IgG                            Normal                  <23                            Low Positive (+)        23-35                            Moderate Positive (+)   36-50                            High Positive (+)       >50                          Reference Range:  Cardiolipin IgM                            Normal                  <11                            Low Positive (+)        11-20                            Moderate Positive (+)   21-30                            High Positive (+)       >  30                          Reference Range:  Cardiolipin IgA                            Normal                  <22                            Low Positive (+)        22-35                            Moderate Positive (+)   36-45                            High Positive (+)       >45  . Anticardiolipin IgG 10/28/2013 20  <23 GPL U/mL Final  . Anticardiolipin IgM 10/28/2013 81* <11 MPL U/mL Final  . PTT Lupus Anticoagulant 10/28/2013 63.9* 28.0 - 43.0 secs Final  . PTTLA Confirmation 10/28/2013 10.8* <8.0 secs Final  . PTTLA 4:1 Mix 10/28/2013 55.4* 28.0 - 43.0 secs Final  . Drvvt 10/28/2013 86.9* <42.9 secs Final  . Drvvt confirmation 10/28/2013 1.12* <1.11 Ratio Final  . dRVVT Incubated 1:1 Mix 10/28/2013 70.2* <42.9 secs Final  . Lupus Anticoagulant 10/28/2013 DETECTED* NOT DETECTED Final  . Phosphatydalserine, IgG 10/28/2013 13  <16 U/mL  Final  . Phosphatydalserine, IgM 10/28/2013 >80* <22 U/mL Final  . Phosphatydalserine, IgA 10/28/2013 19  <20 U/mL Final   Comment: (NOTE)                          The Antiphospholipid Syndrome (APS) is a clinical-pathologic                          correlation that includes a clinical event (e.g. thrombosis, pregnancy                          loss, thrombocytopenia) and persistent positive Antiphospholipid                          Antibodies (IgM or IgG ACA >40 MPL / GPL, IgM or IgG anti-B2GPI                          antibodies, or a Lupus Anticoagulant). The IgA isotype has been                          implicated in smaller studies, but has not yet been incorporated into                          the APS criteria. Reference Terese Door Haemost 2006: 4; 295                          Performed at [facility]  . WBC 10/28/2013 8.5  4.0 - 10.5 K/uL Final  . RBC 10/28/2013 3.36* 3.87 - 5.11  MIL/uL Final  . Hemoglobin 10/28/2013 10.4* 12.0 - 15.0 g/dL Final  . HCT 10/28/2013 32.8* 36.0 - 46.0 % Final  . MCV 10/28/2013 97.6  78.0 - 100.0 fL Final  . MCH 10/28/2013 31.0  26.0 - 34.0 pg Final  . MCHC 10/28/2013 31.7  30.0 - 36.0 g/dL Final  . RDW 10/28/2013 21.7* 11.5 - 15.5 % Final  . Platelets 10/28/2013 46* 150 - 400 K/uL Final  . Neutrophils Relative % 10/28/2013 15* 43 - 77 % Final  . Lymphocytes Relative 10/28/2013 80* 12 - 46 % Final  . Monocytes Relative 10/28/2013 5  3 - 12 % Final  . Eosinophils Relative 10/28/2013 0  0 - 5 % Final  . Basophils Relative 10/28/2013 0  0 - 1 % Final  . Neutro Abs 10/28/2013 1.3* 1.7 - 7.7 K/uL Final  . Lymphs Abs 10/28/2013 6.8* 0.7 - 4.0 K/uL Final  . Monocytes Absolute 10/28/2013 0.4  0.1 - 1.0 K/uL Final  . Eosinophils Absolute 10/28/2013 0.0  0.0 - 0.7 K/uL Final  . Basophils Absolute 10/28/2013 0.0  0.0 - 0.1 K/uL Final  . RBC Morphology 10/28/2013 SCHISTOCYTES PRESENT (2-5/hpf)   Final   Comment: ELLIPTOCYTES                           POLYCHROMASIA PRESENT  . WBC Morphology 10/28/2013 ATYPICAL LYMPHOCYTES   Final   SMUDGE CELLS  . Smear Review 10/28/2013 PLATELET COUNT CONFIRMED BY SMEAR   Final   PLATELETS APPEAR DECREASED  . Prothrombin Time 10/28/2013 24.9* 11.6 - 15.2 seconds Final  . INR 10/28/2013 2.34* 0.00 - 1.49 Final  . ZAP-70 10/28/2013 3  <10 % Final   Comment: (NOTE)                          The immunophenotype is atypical for B-CLL. ZAP-70 is                          correlated with outcome only in CLL.                          This test was developed and its performance                          characteristics have been determined by [facility], [place], [place].                          It has not been cleared or approved by the U.S.                          Food and Drug Administration. The FDA has determined                          that such clearance or approval is not necessary.                          Performance characteristics refer to the analytical  performance of the test.  . Source (ZAP70) 10/28/2013 Not Provided   Final  . Viability (ZAP70) 10/28/2013 90   Final   Performed at [facility]  . Sodium 10/28/2013 145  137 - 147 mEq/L Final  . Potassium 10/28/2013 4.8  3.7 - 5.3 mEq/L Final  . Chloride 10/28/2013 104  96 - 112 mEq/L Final  . CO2 10/28/2013 30  19 - 32 mEq/L Final  . Glucose, Bld 10/28/2013 90  70 - 99 mg/dL Final  . BUN 10/28/2013 64* 6 - 23 mg/dL Final  . Creatinine, Ser 10/28/2013 1.73* 0.50 - 1.10 mg/dL Final  . Calcium 10/28/2013 9.3  8.4 - 10.5 mg/dL Final  . Total Protein 10/28/2013 6.6  6.0 - 8.3 g/dL Final  . Albumin 10/28/2013 3.4* 3.5 - 5.2 g/dL Final  . AST 10/28/2013 17  0 - 37 U/L Final  . ALT 10/28/2013 9  0 - 35 U/L Final  . Alkaline Phosphatase 10/28/2013 71  39 - 117 U/L Final  . Total Bilirubin 10/28/2013 0.8  0.3 - 1.2 mg/dL Final  . GFR calc non Af Amer 10/28/2013 25* >90  mL/min Final  . GFR calc Af Amer 10/28/2013 29* >90 mL/min Final   Comment: (NOTE)                          The eGFR has been calculated using the CKD EPI equation.                          This calculation has not been validated in all clinical situations.                          eGFR's persistently <90 mL/min signify possible Chronic Kidney                          Disease.    PATHOLOGY:  for BARB, SHEAR ([id number]) Patient: MAKEYLA, GOVAN Collected: 10/29/2013 Client: [facility] Accession: [id number] Received: 10/29/2013 Farrel Gobble, MD DOB: 08/14/1924 Age: 50 Gender: F Reported: 11/02/2013 [address] Patient Ph: [phone] MRN #: [id number] Linna Hoff [place] [id number] Visit #: [id number] Chart #: Phone: Fax: CC: FLOW CYTOMETRY REPORT INTERPRETATION Interpretation Peripheral Blood Flow Cytometry MONOCLONAL B CELL POPULATION IDENTIFIED. Diagnosis Comment: Analysis of the lymphoid population shows a monoclonal, lambda restricted B cell population expressing pan B cell antigens including CD20 associated with CD11c and FMC-7 expression. No CD103, CD25, CD5 or CD10 expression is identified. Examination of the peripheral blood shows small to medium sized lymphoid cells with scanty to moderately abundant agranular cytoplasm with irregular borders, dense chromatin and essentially inconspicuous nucleoli. The differential diagnosis includes marginal zone lymphoma, splenic lymphoma, etc. Clinical correlation is recommended. (BNS:kh 11-02-13) Susanne Greenhouse MD Pathologist, Electronic Signature (Case signed 11/02/2013) GROSS AND MICROSCOPIC INFORMATION Source Peripheral Blood Flow Cytometry Microscopic Gated population: Flow cytometric immunophenotyping is performed using antibiodies to the antigens listed in the table below. Electronic gates are placed around a cell cluster displaying light scatter properties corresponding to lymphocytes. - Abnormal Cells in gated  population: 81 % - Phenotype of Abnormal Cells: CD11c, CD19, CD20, CD22, FMC7, HLA-Dr, Lambda 1 of 2 FINAL for Mcpartland, Martita F [phone]) Specimen Table Lymphoid Associated Myeloid Associated Misc. As CD2 neg CD19 pos CD11c pos CD45 ND CD3 neg CD20 pos CD13 ND HLA-DR pos  CD4 neg CD21 neg CD14 ND CD10 neg CD5 neg CD22 pos CD15 ND CD56/16 ND CD7 neg CD23 neg CD33 ND ZAP70 ND CD8 neg CD103 neg MPO ND CD34 ND CD25 neg FMC7 pos CD117 ND CD52 ND sKappa neg CD38 ND sLambda pos cKappa ND cLambda ND Gross Received from [facility] are two lavender tubes labeled as PBS to evaluate for CLL. All controls stained appropriately. The above tests were developed and their performance characteristics determined by the [facility]. They have not been cleared or approved by the U.S. Food and Drug Administration." Report signed from the following location(s) Interpretation performed at [facility], [place], [place] [address]. CLIA #: [id number],  Urinalysis    Component Value Date/Time   COLORURINE YELLOW 06/29/2010 1025   APPEARANCEUR CLEAR 06/29/2010 1025   LABSPEC 1.014 06/29/2010 1025   PHURINE 6.0 06/29/2010 1025   GLUCOSEU NEGATIVE 06/29/2010 1025   HGBUR TRACE* 06/29/2010 1025   BILIRUBINUR NEGATIVE 06/29/2010 1025   KETONESUR NEGATIVE 06/29/2010 1025   PROTEINUR NEGATIVE 06/29/2010 1025   UROBILINOGEN 1.0 06/29/2010 1025   NITRITE NEGATIVE 06/29/2010 1025   LEUKOCYTESUR NEGATIVE 06/29/2010 1025    RADIOGRAPHIC STUDIES:   CT Abdomen Pelvis Wo Contrast Status: Final result         PACS Images    Show images for CT Abdomen Pelvis Wo Contrast         Study Result    CLINICAL DATA: Left lower quadrant tenderness, anemia, evaluate for  retroperitoneal hemorrhage  EXAM:  CT ABDOMEN AND PELVIS WITHOUT CONTRAST  TECHNIQUE:  Multidetector CT imaging of the abdomen and pelvis was performed  following the standard protocol without intravenous  contrast.  COMPARISON: 01/11/2008  FINDINGS:  Coronary atherosclerosis. Left subclavian pacemaker, incompletely  visualized.  Moderate right and small left pleural effusions. Associated lower  lobe opacities, likely atelectasis. Mild interstitial edema is  suspected (series 4/ image 3).  Unenhanced liver, pancreas, and adrenal glands are within normal  limits.  Splenomegaly, measuring 20.6 cm in craniocaudal dimension.  Status post cholecystectomy. No intrahepatic or extrahepatic ductal  dilatation.  Bilateral renal cortical atrophy with multiple renal cysts,  including a 2.7 cm lateral left upper pole renal cyst (series 3/  image 31) and a 2.3 cm medial right lower pole renal cyst (series  3/image 40). No renal calculi or hydronephrosis.  No evidence of bowel obstruction. Extensive colonic diverticulosis,  without associated inflammatory changes.  Atherosclerotic calcifications of the abdominal aorta and branch  vessels.  Small volume abdominopelvic ascites, likely simple. Perihepatic  ascites measures just above simple fluid density, although this  likely spuriously elevated due to streak artifact from the patient's  arms.  Body wall edema.  No evidence of retroperitoneal hemorrhage.  No suspicious abdominopelvic lymphadenopathy.  Bladder is decompressed by indwelling Foley catheter.  Uterus and bilateral ovaries are poorly evaluated.  Tiny right and small left fat containing inguinal hernias.  Associated 2.8 x 3.2 cm fluid collection within the left hernia  (series 3/image 88).  Degenerative changes of the visualized thoracolumbar spine.  Exaggerated lower thoracic kyphosis.  IMPRESSION:  No evidence of retroperitoneal hemorrhage.  Splenomegaly, measuring 20.6 cm in craniocaudal dimension.  Moderate right and small left pleural effusions with possible mild  interstitial edema.  Small volume abdominopelvic ascites, simple. Body wall edema.  Electronically Signed  By:  Julian Hy M.D.  On: 09/25/2013       Dg Chest 1 View  10/30/2013   CLINICAL DATA:  Cough and congestion  EXAM: CHEST - 1 VIEW  COMPARISON:  AP chest x-ray dated September 28, 2013.  FINDINGS: The cardiopericardial silhouette remains enlarged. The pulmonary vascularity remains engorged and indistinct. This has increased in conspicuity however. The pulmonary interstitial markings are mildly increased though stable. There remains obscuration of the hemidiaphragms. A permanent pacemaker is in place.  IMPRESSION: The findings are consistent with congestive heart failure with pulmonary interstitial edema and bilateral pleural effusions. Superimposed pneumonia cannot be excluded. Overall there has not been dramatic change since the previous study.   Electronically Signed   By: David  Martinique   On: 10/30/2013 14:41   Ct Head Wo Contrast  10/29/2013   CLINICAL DATA:  Weakness post fall.  EXAM: CT HEAD WITHOUT CONTRAST  TECHNIQUE: Contiguous axial images were obtained from the base of the skull through the vertex without intravenous contrast.  COMPARISON:  None.  FINDINGS: Atherosclerotic and physiologic intracranial calcifications. Retention cyst or polyp in the left maxillary sinus. Multiple missing teeth. Diffuse parenchymal atrophy. Patchy areas of hypoattenuation in deep and periventricular white matter bilaterally. Negative for acute intracranial hemorrhage, mass lesion, acute infarction, midline shift, or mass-effect. Acute infarct may be inapparent on noncontrast CT. Ventricles and sulci symmetric. Bone windows demonstrate no focal lesion.  IMPRESSION: 1. Negative for bleed or other acute  intracranial process. 2. Atrophy and nonspecific white matter changes as above   Electronically Signed   By: Arne Cleveland M.D.   On: 10/29/2013 15:52    ASSESSMENT:  #1. Primary splenic lymphoma (marginal zone lymphoma ) with leukemic manifestations manifesting as relative lymphocytosis in the setting of a  normal total white cell count with thrombocytopenia and anemia. #2. Left ventricular heart failure with ejection fraction of 40%, status post pacemaker for tachybradycardia syndrome, currently in atrial formulation, no longer taking warfarin. #3. Gastroesophageal reflux disease, on treatment. #4. Dementia   PLAN:  #1. In the presence of her daughter, a discussion was held regarding the indolent nature of splenic lymphoma but with the need for treatment when symptoms occur or blood counts are compromise. She may be at that point. She was given information about Rituxan, a monoclonal antibody that can be given weekly for 4 weeks and control the lymphoma without significant myelosuppression. #2. Weekly CBC to be faxed to the [facility]. #3. Office visit in 3 weeks with plans to possibly initiate therapy with Rituxan if further Continues to drop and anemia worsens.   All questions were answered. The patient knows to call the clinic with any problems, questions or concerns. We can certainly see the patient much sooner if necessary.   I spent 40 minutes counseling the patient face to face. The total time spent in the appointment was 55 minutes.    Doroteo Bradford, MD 11/18/2013 11:16 AM

## 2013-11-18 NOTE — Patient Instructions (Signed)
[place] Discharge Instructions  RECOMMENDATIONS MADE BY THE CONSULTANT AND ANY TEST RESULTS WILL BE SENT TO YOUR REFERRING PHYSICIAN.  EXAM FINDINGS BY THE PHYSICIAN TODAY AND SIGNS OR SYMPTOMS TO REPORT TO CLINIC OR PRIMARY PHYSICIAN: Exam and findings as discussed by Dr. Barnet Glasgow.  INSTRUCTIONS/FOLLOW-UP: 1.  Return in 3 weeks as scheduled for labs, an MD visit, and potentially to receive Rituxan. 2.  Our Nurse Navigator will be contacting you to set you up for teaching about the Rituxan. 3.  You are also being set-up for weekly CBCs at the [facility].  Thank you for choosing [facility] to provide your oncology and hematology care.  To afford each patient quality time with our providers, please arrive at least 15 minutes before your scheduled appointment time.  With your help, our goal is to use those 15 minutes to complete the necessary work-up to ensure our physicians have the information they need to help with your evaluation and healthcare recommendations.    Effective January 1st, 2014, we ask that you re-schedule your appointment with our physicians should you arrive 10 or more minutes late for your appointment.  We strive to give you quality time with our providers, and arriving late affects you and other patients whose appointments are after yours.    Again, thank you for choosing [facility].  Our hope is that these requests will decrease the amount of time that you wait before being seen by our physicians.       _____________________________________________________________  Should you have questions after your visit to [facility], please contact our office at [phone] between the hours of 8:30 a.m. and 5:00 p.m.  Voicemails left after 4:30 p.m. will not be returned until the following business day.  For prescription refill requests, have your pharmacy contact our office with your prescription refill request.

## 2013-11-18 NOTE — Progress Notes (Signed)
Labs drawn today for cbc/diff,retic 

## 2013-11-25 ENCOUNTER — Telehealth (HOSPITAL_COMMUNITY): Payer: Self-pay | Admitting: Hematology and Oncology

## 2013-11-25 NOTE — Telephone Encounter (Signed)
FAXED PRE [facility] ON 11/22/13

## 2013-12-02 ENCOUNTER — Ambulatory Visit (HOSPITAL_COMMUNITY)
Admission: RE | Admit: 2013-12-02 | Discharge: 2013-12-02 | Disposition: A | Discharge status: Home / Self Care | Payer: Medicare HMO | Source: Skilled Nursing Facility | Attending: Internal Medicine | Admitting: Internal Medicine

## 2013-12-02 DIAGNOSIS — R062 Wheezing: Secondary | ICD-10-CM | POA: Insufficient documentation

## 2013-12-03 ENCOUNTER — Other Ambulatory Visit (HOSPITAL_COMMUNITY): Payer: Self-pay | Admitting: Hematology and Oncology

## 2013-12-06 NOTE — Patient Instructions (Addendum)
[place]   CHEMOTHERAPY INSTRUCTIONS  Rituxan - Before taking Rituxan you need to take Tylenol 650mg  and Benadryl 50mg  1 hour before the Rituxan. You can take this at home. This reduces your risk of having an allergic reaction to the Rituxan. You will do this each time prior to Rituxan. Side Effects: during infusion - itching, low blood pressure, low oxygen, bronchospasm, rash, trouble breathing - we need to know immediately if any of this happens. The first time you receive this drug, it takes a long time to infuse because we titrate the drug very slowly. With each Rituxan infusion, the likelihood of developing an infusion reaction decreases. You may also experience fever, chills, shaking chills, headaches, muscle aches, nausea, rash, and a low white blood cell count. We need to be sure that you are drinking plenty of fluids - preferably 64oz of decaff fluids/water daily. It is best to start drinking fluids 2 days prior to treatment and for up to 4-5 days after treatment. As your tumor breaks down, it leaves behind uric acid and the extra fluid that you drink helps to flush this out of your body.    You will take this once a week for 4 weeks in a row. The first infusion will take the longest. The risk of having an infusion reaction is greatest with the 1st infusion. There is still a risk of having an infusion reaction with subsequent infusions but it is not as likely.   EDUCATIONAL MATERIALS GIVEN AND REVIEWED: Chemotherapy and You  Specific Instructions Sheets: Rituxan   SELF CARE ACTIVITIES WHILE ON CHEMOTHERAPY: Increase your fluid intake 48 hours prior to treatment and drink at least 2 quarts per day after treatment., No alcohol intake., No aspirin or other medications unless approved by your oncologist., Eat foods that are light and easy to digest., Eat foods at cold or room temperature., No fried, fatty, or spicy foods immediately before or after treatment.,  Have teeth cleaned professionally before starting treatment. Keep dentures and partial plates clean., Use soft toothbrush and do not use mouthwashes that contain alcohol. Biotene is a good mouthwash that is available at most pharmacies or may be ordered by calling [phone]., Use warm salt water gargles (1 teaspoon salt per 1 quart warm water) before and after meals and at bedtime. Or you may rinse with 2 tablespoons of three -percent hydrogen peroxide mixed in eight ounces of water., Always use sunscreen with SPF (Sun Protection Factor) of 30 or higher., Use your nausea medication as directed to prevent nausea., Use your stool softener or laxative as directed to prevent constipation. and Use your anti-diarrheal medication as directed to stop diarrhea.  Please wash your hands for at least 30 seconds using warm soapy water. Handwashing is the #1 way to prevent the spread of germs. Stay away from sick people or people who are getting over a cold. If you develop respiratory systems such as green/yellow mucus production or productive cough or persistent cough let us know and we will see if you need an antibiotic. It is a good idea to keep a pair of gloves on when going into grocery stores/[employer] to decrease your risk of coming into contact with germs on the carts, etc. Carry alcohol hand gel with you at all times and use it frequently if out in public. All foods need to be cooked thoroughly. No raw foods. No medium or undercooked meats, eggs. If your food is cooked medium well, it  does not need to be hot pink or saturated with bloody liquid at all. Vegetables and fruits need to be washed/rinsed under the faucet with a dish detergent before being consumed. You can eat raw fruits and vegetables unless we tell you otherwise but it would be best if you cooked them or bought frozen. Do not eat off of salad bars or hot bars unless you really trust the cleanliness of the restaurant. If you need dental work, please let  us know before you go for your appointment so that we can coordinate the best possible time for you in regards to your chemo regimen. You need to also let your dentist know that you are actively taking chemo. We may need to do labs prior to your dental appointment. We also want your bowels moving at least every other day. If this is not happening, we need to know so that we can get you on a bowel regimen to help you go.    MEDICATIONS: You have been given prescriptions for the following medications:  Over-the-Counter Meds:  Colace - this is a stool softener. Take 100mg  capsule 2-6 times a day as needed. If you have to take more than 6 capsules of Colace a day call the [facility].  Senna - this is a mild laxative used to treat mild constipation. May take 2 tabs by mouth daily or up to twice a day as needed for mild constipation.  Milk of Magnesia - this is a laxative used to treat moderate to severe constipation. May take 2-4 tablespoons every 8 hours as needed. May increase to 8 tablespoons x 1 dose and if no bowel movement call the [facility].  Imodium - this is for diarrhea. Take 2 tabs after 1st loose stool and then 1 tab after each loose stool until you go a total of 12 hours without a loose stool. Call [facility] if loose stools continue.   SYMPTOMS TO REPORT AS SOON AS POSSIBLE AFTER TREATMENT:  FEVER GREATER THAN 100.5 F  CHILLS WITH OR WITHOUT FEVER  NAUSEA AND VOMITING THAT IS NOT CONTROLLED WITH YOUR NAUSEA MEDICATION  UNUSUAL SHORTNESS OF BREATH  UNUSUAL BRUISING OR BLEEDING  TENDERNESS IN MOUTH AND THROAT WITH OR WITHOUT PRESENCE OF ULCERS  URINARY PROBLEMS  BOWEL PROBLEMS  UNUSUAL RASH    Wear comfortable clothing and clothing appropriate for easy access to any Portacath or PICC line. Let us know if there is anything that we can do to make your therapy better!      I have been informed and understand all of the instructions given to me and have  received a copy. I have been instructed to call the clinic [phone] or my family physician as soon as possible for continued medical care, if indicated. I do not have any more questions at this time but understand that I may call the [facility] or the Patient Navigator at [phone] during office hours should I have questions or need assistance in obtaining follow-up care.      _________________________________________      _______________     __________ Signature of Patient or Authorized Representative        Date                            Time      _________________________________________ Nurse's Signature      Rituximab injection What is this medicine? RITUXIMAB (ri TUX i  mab) is a monoclonal antibody. This medicine changes the way the body's immune system works. It is used commonly to treat non-Hodgkin's lymphoma and other conditions. In cancer cells, this drug targets a specific protein within cancer cells and stops the cancer cells from growing. It is also used to treat rhuematoid arthritis (RA). In RA, this medicine slow the inflammatory process and help reduce joint pain and swelling. This medicine is often used with other cancer or arthritis medications. This medicine may be used for other purposes; ask your health care provider or pharmacist if you have questions. COMMON BRAND NAME(S): Rituxan What should I tell my health care provider before I take this medicine? They need to know if you have any of these conditions: -blood disorders -heart disease -history of hepatitis B -infection (especially a virus infection such as chickenpox, cold sores, or herpes) -irregular heartbeat -kidney disease -lung or breathing disease, like asthma -lupus -an unusual or allergic reaction to rituximab, mouse proteins, other medicines, foods, dyes, or preservatives -pregnant or trying to get pregnant -breast-feeding How should I use this medicine? This medicine is for  infusion into a vein. It is administered in a hospital or clinic by a specially trained health care professional. A special MedGuide will be given to you by the pharmacist with each prescription and refill. Be sure to read this information carefully each time. Talk to your pediatrician regarding the use of this medicine in children. This medicine is not approved for use in children. Overdosage: If you think you have taken too much of this medicine contact a poison control center or emergency room at once. NOTE: This medicine is only for you. Do not share this medicine with others. What if I miss a dose? It is important not to miss a dose. Call your doctor or health care professional if you are unable to keep an appointment. What may interact with this medicine? -cisplatin -medicines for blood pressure -some other medicines for arthritis -vaccines This list may not describe all possible interactions. Give your health care provider a list of all the medicines, herbs, non-prescription drugs, or dietary supplements you use. Also tell them if you smoke, drink alcohol, or use illegal drugs. Some items may interact with your medicine. What should I watch for while using this medicine? Report any side effects that you notice during your treatment right away, such as changes in your breathing, fever, chills, dizziness or lightheadedness. These effects are more common with the first dose. Visit your prescriber or health care professional for checks on your progress. You will need to have regular blood work. Report any other side effects. The side effects of this medicine can continue after you finish your treatment. Continue your course of treatment even though you feel ill unless your doctor tells you to stop. Call your doctor or health care professional for advice if you get a fever, chills or sore throat, or other symptoms of a cold or flu. Do not treat yourself. This drug decreases your body's ability to  fight infections. Try to avoid being around people who are sick. This medicine may increase your risk to bruise or bleed. Call your doctor or health care professional if you notice any unusual bleeding. Be careful brushing and flossing your teeth or using a toothpick because you may get an infection or bleed more easily. If you have any dental work done, tell your dentist you are receiving this medicine. Avoid taking products that contain aspirin, acetaminophen, ibuprofen, naproxen, or ketoprofen unless  instructed by your doctor. These medicines may hide a fever. Do not become pregnant while taking this medicine. Women should inform their doctor if they wish to become pregnant or think they might be pregnant. There is a potential for serious side effects to an unborn child. Talk to your health care professional or pharmacist for more information. Do not breast-feed an infant while taking this medicine. What side effects may I notice from receiving this medicine? Side effects that you should report to your doctor or health care professional as soon as possible: -allergic reactions like skin rash, itching or hives, swelling of the face, lips, or tongue -low blood counts - this medicine may decrease the number of white blood cells, red blood cells and platelets. You may be at increased risk for infections and bleeding. -signs of infection - fever or chills, cough, sore throat, pain or difficulty passing urine -signs of decreased platelets or bleeding - bruising, pinpoint red spots on the skin, black, tarry stools, blood in the urine -signs of decreased red blood cells - unusually weak or tired, fainting spells, lightheadedness -breathing problems -confused, not responsive -chest pain -fast, irregular heartbeat -feeling faint or lightheaded, falls -mouth sores -redness, blistering, peeling or loosening of the skin, including inside the mouth -stomach pain -swelling of the ankles, feet, or  hands -trouble passing urine or change in the amount of urine Side effects that usually do not require medical attention (report to your doctor or other health care professional if they continue or are bothersome): -anxiety -headache -loss of appetite -muscle aches -nausea -night sweats This list may not describe all possible side effects. Call your doctor for medical advice about side effects. You may report side effects to FDA at 1-800-FDA-1088. Where should I keep my medicine? This drug is given in a hospital or clinic and will not be stored at home. NOTE: This sheet is a summary. It may not cover all possible information. If you have questions about this medicine, talk to your doctor, pharmacist, or health care provider.  2014, Elsevier/Gold Standard. (2008-05-30 14:04:59)

## 2013-12-07 ENCOUNTER — Encounter (HOSPITAL_COMMUNITY): Payer: Self-pay

## 2013-12-07 ENCOUNTER — Encounter (HOSPITAL_BASED_OUTPATIENT_CLINIC_OR_DEPARTMENT_OTHER): Payer: Medicare HMO

## 2013-12-07 DIAGNOSIS — C9110 Chronic lymphocytic leukemia of B-cell type not having achieved remission: Secondary | ICD-10-CM

## 2013-12-07 DIAGNOSIS — C8307 Small cell B-cell lymphoma, spleen: Secondary | ICD-10-CM

## 2013-12-07 LAB — CBC WITH DIFFERENTIAL/PLATELET
Basophils Absolute: 0 10*3/uL (ref 0.0–0.1)
Basophils Relative: 0 % (ref 0–1)
EOS PCT: 1 % (ref 0–5)
Eosinophils Absolute: 0.1 10*3/uL (ref 0.0–0.7)
HEMATOCRIT: 30.5 % — AB (ref 36.0–46.0)
Hemoglobin: 9.7 g/dL — ABNORMAL LOW (ref 12.0–15.0)
LYMPHS ABS: 4.4 10*3/uL — AB (ref 0.7–4.0)
LYMPHS PCT: 67 % — AB (ref 12–46)
MCH: 32.3 pg (ref 26.0–34.0)
MCHC: 31.8 g/dL (ref 30.0–36.0)
MCV: 101.7 fL — AB (ref 78.0–100.0)
Monocytes Absolute: 0.3 10*3/uL (ref 0.1–1.0)
Monocytes Relative: 4 % (ref 3–12)
NEUTROS ABS: 1.8 10*3/uL (ref 1.7–7.7)
Neutrophils Relative %: 28 % — ABNORMAL LOW (ref 43–77)
PLATELETS: 49 10*3/uL — AB (ref 150–400)
RBC: 3 MIL/uL — AB (ref 3.87–5.11)
RDW: 16.9 % — AB (ref 11.5–15.5)
WBC: 6.5 10*3/uL (ref 4.0–10.5)

## 2013-12-07 LAB — RETICULOCYTES
RBC.: 3 MIL/uL — ABNORMAL LOW (ref 3.87–5.11)
Retic Count, Absolute: 57 10*3/uL (ref 19.0–186.0)
Retic Ct Pct: 1.9 % (ref 0.4–3.1)

## 2013-12-07 NOTE — Progress Notes (Signed)
Chemo teaching done and consent signed for Rituxan. Daughter [name] was also informed of side effects of Rituxan on 12/06/13. Labs drawn today in preparation for MD visit on Thursday 12/09/13. Teaching sheet on Rituxan sent to [facility] for the nurses to review.

## 2013-12-07 NOTE — Progress Notes (Signed)
Labs drawn today for cbc/diff,retic,hep b surface antigen,hep b core antibody

## 2013-12-08 LAB — HEPATITIS B CORE ANTIBODY, IGM: HEP B C IGM: NONREACTIVE

## 2013-12-08 LAB — HEPATITIS B SURFACE ANTIGEN: HEP B S AG: NEGATIVE

## 2013-12-09 ENCOUNTER — Other Ambulatory Visit (HOSPITAL_COMMUNITY): Payer: Medicare HMO

## 2013-12-09 ENCOUNTER — Ambulatory Visit (HOSPITAL_COMMUNITY): Payer: Medicare HMO

## 2013-12-09 ENCOUNTER — Inpatient Hospital Stay (HOSPITAL_COMMUNITY): Payer: Medicare HMO

## 2013-12-10 ENCOUNTER — Inpatient Hospital Stay (HOSPITAL_COMMUNITY): Payer: Medicare HMO

## 2013-12-12 NOTE — Progress Notes (Signed)
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   INTERVAL HISTORY: [name] 80 y.o. female returns for  regular  visit for followup of splenic marginal zome B-cell lymphoma with leukemic manifestations.  Starting Rituxan weekly x 4 cycles today (12/13/2013).   I personally reviewed and went over laboratory results with the patient.  The results are noted within this dictation.  She demonstrates a macrocytic anemia with stable thrombocytopenia, both are manifestations of her hematologic malignancy.  The role of today's visit was to answer any questions she you're the family may happen also to introduce myself.  I have reviewed the patient's treatment plan in detail. The patient is accompanied by her daughter, [name], her husband and also her pastor. The plan is for her to receive 4 cycles of Rituxan weekly now completed therapy. At some point time she may need be rechallenged with the same regimen but that is to be determined. We discussed the risks, benefits, alternatives, and side effects of Rituxan monotherapy. This therapy should be well tolerated particularly in this 80 year old woman.  I answered all their questions which were very few. They're very pleased with chemotherapy teaching which answered all their serious questions. The husband have the most questions and these were answered to his satisfaction.  Hematologically, the patient denies any complaints and ROS questioning is negative. She is seen today in the chemotherapy room receiving her first dose of Rituxan.   Past Medical History  Diagnosis Date  . GERD (gastroesophageal reflux disease)   . Carotid stenosis     a. 92011 Carotid u/s: Right 50-69%, left <50%  .  Atrial fibrillation     a. chronic coumadin;  b. 07/2011 Echo: EF 65%, triv MR, mildly dil LA.  . Tachy-brady syndrome     a. 06/2010: SJM Acc[employer] RF DR model XVQ008 (serial number [id number])  . Essential hypertension, benign   . Hypothyroidism     a. Dx 09/2013  . Lupus pt's daughter states she was diagnosed with this in her 70s     has FIBRILLATION, ATRIAL; BRADYCARDIA-TACHYCARDIA SYNDROME; CAROTID ARTERY STENOSIS; PACEMAKER-St.Jude; Essential hypertension, benign; Encounter for long-term (current) use of anticoagulants; Atrial fibrillation with RVR; Anemia; Dyspnea; Hypothyroidism; AKI (acute kidney injury); Acute confusional state; Other dysphagia; Aspiration pneumonia; Thrombocytopenia; CLL (chronic lymphocytic leukemia); and Splenic marginal zone b-cell lymphoma on her problem list.     is allergic to penicillins.  [name] does not currently have medications on file.  Past Surgical History  Procedure Laterality Date  . Total knee arthroplasty      Right  . Laparoscopic cholecystectomy    . Pacemaker insertion  06/29/10    SJM implanted by Dr Rayann Heman for tachy/brady syndrome    Denies any headaches, dizziness, double vision, fevers, chills, night sweats, nausea, vomiting, diarrhea, constipation, chest pain, heart palpitations, shortness of breath, blood in stool, black tarry stool, urinary pain, urinary burning, urinary frequency, hematuria.   PHYSICAL EXAMINATION  ECOG PERFORMANCE STATUS: 2 - Symptomatic, <50% confined to bed  There were no vitals filed for this visit.  GENERAL:no distress, well nourished, well developed and tired secondary to benadryl premedication. SKIN: skin color, texture, turgor are normal, no rashes or significant lesions HEAD: Normocephalic, No masses, lesions, tenderness or abnormalities EYES:  normal, PERRLA, EOMI, Conjunctiva are pink and non-injected EARS: External ears normal OROPHARYNX:mucous membranes are moist  NECK: supple, trachea  midline LYMPH:  not examined BREAST:not examined LUNGS: not examined HEART: not examined ABDOMEN:not examined BACK: Back symmetric, no curvature. EXTREMITIES:less then 2 second capillary refill, positive findings:  Ecchymoses throughout UE  NEURO: no focal motor/sensory deficits    LABORATORY DATA: CBC    Component Value Date/Time   WBC 0.8* 12/13/2013 0915   RBC 3.20* 12/13/2013 0915   RBC 3.00* 12/07/2013 1037   HGB 10.3* 12/13/2013 0915   HCT 32.0* 12/13/2013 0915   PLT 27* 12/13/2013 0915   MCV 100.0 12/13/2013 0915   MCH 32.3 12/07/2013 1037   MCHC 32.2 12/13/2013 0915   RDW 15.8* 12/13/2013 0915   LYMPHSABS 0.6* 12/13/2013 0915   MONOABS 0.0* 12/13/2013 0915   EOSABS 0.0 12/13/2013 0915   BASOSABS 0.0 12/13/2013 0915      Chemistry      Component Value Date/Time   NA 145 10/28/2013 1340   K 4.8 10/28/2013 1340   CL 104 10/28/2013 1340   CO2 30 10/28/2013 1340   BUN 64* 10/28/2013 1340   CREATININE 1.73* 10/28/2013 1340      Component Value Date/Time   CALCIUM 9.3 10/28/2013 1340   ALKPHOS 71 10/28/2013 1340   AST 17 10/28/2013 1340   ALT 9 10/28/2013 1340   BILITOT 0.8 10/28/2013 1340     Lab Results  Component Value Date   VITAMINB12 235 10/29/2013   Lab Results  Component Value Date   FOLATE 16.0 10/29/2013      PENDING LABS: CBC diff, LDH, iron/TIBC, ferritin    ASSESSMENT:  1. Primary splenic lymphoma (marginal zone lymphoma ) with leukemic manifestations manifesting as relative lymphocytosis in the setting of a normal total white cell count with thrombocytopenia and anemia. 2. Macrocytic anemia, secondary to #1 3. Thrombocytopenia, secondary to #1. 4. Leukopenia, secondary to #1  Patient Active Problem List   Diagnosis Date Noted  . Splenic marginal zone b-cell lymphoma 11/18/2013  . CLL (chronic lymphocytic leukemia) 10/29/2013  . Thrombocytopenia 10/25/2013  . Other dysphagia 09/29/2013  . Aspiration pneumonia 09/29/2013  . Acute confusional state 09/26/2013  .  AKI (acute kidney injury) 09/25/2013  . Atrial fibrillation with RVR 09/24/2013  . Anemia 09/24/2013  . Dyspnea 09/24/2013  . Hypothyroidism   . Encounter for long-term (current) use of anticoagulants 01/25/2011  . Essential hypertension, benign 09/25/2010  . BRADYCARDIA-TACHYCARDIA SYNDROME 08/27/2010  . FIBRILLATION, ATRIAL 07/30/2010  . CAROTID ARTERY STENOSIS 07/30/2010  . PACEMAKER-St.Jude 07/30/2010    PLAN:  1. I personally reviewed and went over laboratory results with the patient.  The results are noted within this dictation. 2. I personally reviewed and went over radiographic studies with the patient.  The results are noted within this dictation.   3. Labs today: CBC diff 4. Will add the following labs to today's lab work: LDH, Iron/TIBC, Ferritin 5. The role for iron studies to rule out an iron deficiency anemia, masked by malignancy.   6. Return in 2 weeks for follow-up   THERAPY PLAN:  She is receiving her first cycle of Rituxan therapy. Our plan is to administer weekly cycles x4 of Rituxan.  We will manage any toxicities that manifest but we do not expect any given the tolerability of this monotherapy.  All questions were answered. The patient knows to call the clinic with any problems, questions or concerns. We can certainly see the patient much  sooner if necessary.  Patient and plan discussed with Dr. Farrel Gobble and he is in agreement with the aforementioned.   Seeley [place]gate

## 2013-12-13 ENCOUNTER — Encounter (HOSPITAL_COMMUNITY): Payer: Self-pay

## 2013-12-13 ENCOUNTER — Encounter (HOSPITAL_BASED_OUTPATIENT_CLINIC_OR_DEPARTMENT_OTHER): Payer: Medicare HMO | Admitting: Oncology

## 2013-12-13 ENCOUNTER — Encounter (HOSPITAL_COMMUNITY): Payer: Medicare HMO | Attending: Hematology and Oncology

## 2013-12-13 VITALS — BP 89/48 | HR 120 | Temp 97.3°F | Resp 22 | Wt 142.2 lb

## 2013-12-13 DIAGNOSIS — C9110 Chronic lymphocytic leukemia of B-cell type not having achieved remission: Secondary | ICD-10-CM | POA: Insufficient documentation

## 2013-12-13 DIAGNOSIS — D539 Nutritional anemia, unspecified: Secondary | ICD-10-CM | POA: Insufficient documentation

## 2013-12-13 DIAGNOSIS — C8307 Small cell B-cell lymphoma, spleen: Secondary | ICD-10-CM

## 2013-12-13 DIAGNOSIS — D6959 Other secondary thrombocytopenia: Secondary | ICD-10-CM

## 2013-12-13 DIAGNOSIS — D696 Thrombocytopenia, unspecified: Secondary | ICD-10-CM

## 2013-12-13 DIAGNOSIS — Z5112 Encounter for antineoplastic immunotherapy: Secondary | ICD-10-CM

## 2013-12-13 DIAGNOSIS — D72819 Decreased white blood cell count, unspecified: Secondary | ICD-10-CM

## 2013-12-13 LAB — IRON AND TIBC
IRON: 33 ug/dL — AB (ref 42–135)
Saturation Ratios: 13 % — ABNORMAL LOW (ref 20–55)
TIBC: 247 ug/dL — ABNORMAL LOW (ref 250–470)
UIBC: 214 ug/dL (ref 125–400)

## 2013-12-13 LAB — FERRITIN: Ferritin: 120 ng/mL (ref 10–291)

## 2013-12-13 LAB — CBC WITH DIFFERENTIAL/PLATELET
BASOS ABS: 0 10*3/uL (ref 0.0–0.1)
Basophils Relative: 0 % (ref 0–1)
Eosinophils Absolute: 0 10*3/uL (ref 0.0–0.7)
Eosinophils Relative: 1 % (ref 0–5)
HCT: 32 % — ABNORMAL LOW (ref 36.0–46.0)
Hemoglobin: 10.3 g/dL — ABNORMAL LOW (ref 12.0–15.0)
LYMPHS PCT: 68 % — AB (ref 12–46)
Lymphs Abs: 0.6 10*3/uL — ABNORMAL LOW (ref 0.7–4.0)
MCHC: 32.2 g/dL (ref 30.0–36.0)
MCV: 100 fL (ref 78.0–100.0)
Monocytes Absolute: 0 10*3/uL — ABNORMAL LOW (ref 0.1–1.0)
Monocytes Relative: 4 % (ref 3–12)
NEUTROS ABS: 0.2 10*3/uL — AB (ref 1.7–7.7)
Neutrophils Relative %: 28 % — ABNORMAL LOW (ref 43–77)
PLATELETS: 27 10*3/uL — AB (ref 150–400)
RBC: 3.2 MIL/uL — ABNORMAL LOW (ref 3.87–5.11)
RDW: 15.8 % — ABNORMAL HIGH (ref 11.5–15.5)
WBC: 0.8 10*3/uL — CL (ref 4.0–10.5)

## 2013-12-13 LAB — LACTATE DEHYDROGENASE: LDH: 205 U/L (ref 94–250)

## 2013-12-13 MED ORDER — DIPHENHYDRAMINE HCL 25 MG PO CAPS
ORAL_CAPSULE | ORAL | Status: AC
  Filled 2013-12-13: qty 2

## 2013-12-13 MED ORDER — DIPHENHYDRAMINE HCL 25 MG PO CAPS
50.0000 mg | ORAL_CAPSULE | Freq: Once | ORAL | Status: AC
  Administered 2013-12-13: 50 mg via ORAL

## 2013-12-13 MED ORDER — ACETAMINOPHEN 325 MG PO TABS
650.0000 mg | ORAL_TABLET | Freq: Once | ORAL | Status: AC
  Administered 2013-12-13: 650 mg via ORAL

## 2013-12-13 MED ORDER — SODIUM CHLORIDE 0.9 % IV SOLN
375.0000 mg/m2 | Freq: Once | INTRAVENOUS | Status: AC
  Administered 2013-12-13: 600 mg via INTRAVENOUS
  Filled 2013-12-13: qty 60

## 2013-12-13 MED ORDER — SODIUM CHLORIDE 0.9 % IV SOLN
Freq: Once | INTRAVENOUS | Status: AC
  Administered 2013-12-13: 10:00:00 via INTRAVENOUS

## 2013-12-13 MED ORDER — SODIUM CHLORIDE 0.9 % IJ SOLN: 10.0000 mL | INTRAMUSCULAR | Status: DC | PRN

## 2013-12-13 MED ORDER — ACETAMINOPHEN 325 MG PO TABS
ORAL_TABLET | ORAL | Status: AC
  Filled 2013-12-13: qty 2

## 2013-12-13 MED ORDER — RITUXIMAB CHEMO INJECTION 10 MG/ML
375.0000 mg/m2 | Freq: Once | INTRAVENOUS | Status: DC
  Filled 2013-12-13: qty 60

## 2013-12-13 NOTE — Patient Instructions (Addendum)
[place] Discharge Instructions  RECOMMENDATIONS MADE BY THE CONSULTANT AND ANY TEST RESULTS WILL BE SENT TO YOUR REFERRING PHYSICIAN.   Report fevers, chills, increased shortness of breath or other problems.  MEDICATIONS PRESCRIBED:  None  INSTRUCTIONS GIVEN AND DISCUSSED: None  SPECIAL INSTRUCTIONS/FOLLOW-UP: Weekly chemotherapy with (Rituxan) for four weeks.  We will perform lab work weekly.   To be seen by provider in 2 weeks. Thank you for choosing [facility] to provide your oncology and hematology care.  To afford each patient quality time with our providers, please arrive at least 15 minutes before your scheduled appointment time.  With your help, our goal is to use those 15 minutes to complete the necessary work-up to ensure our physicians have the information they need to help with your evaluation and healthcare recommendations.    Effective January 1st, 2014, we ask that you re-schedule your appointment with our physicians should you arrive 10 or more minutes late for your appointment.  We strive to give you quality time with our providers, and arriving late affects you and other patients whose appointments are after yours.    Again, thank you for choosing [facility].  Our hope is that these requests will decrease the amount of time that you wait before being seen by our physicians.       _____________________________________________________________  Should you have questions after your visit to [facility], please contact our office at [phone] between the hours of 8:30 a.m. and 5:00 p.m.  Voicemails left after 4:30 p.m. will not be returned until the following business day.  For prescription refill requests, have your pharmacy contact our office with your prescription refill request.

## 2013-12-13 NOTE — Patient Instructions (Signed)
See AVS printed from PA appointment.

## 2013-12-13 NOTE — Progress Notes (Signed)
..  [name] arrives via w/c this am for week 1 rituxan. Assisted to bed with 2 nurses helping. Assessment performed and IV started without incidence. C/O restless legs. Up to chair with some relief. No resp distress noted thru out the day. No chills or fever. BP consistently low thru out the day. Patient drowsy but responds appropriately. Loose BM's x 3 today. Report given to [facility].

## 2013-12-13 NOTE — Progress Notes (Signed)
CRITICAL VALUE ALERT  Critical value received:  WBC 0.8; Platelets 27  Date of notification:  12/13/13  Time of notification:  1200  Critical value read back:yes  Nurse who received alert:  A. Ouida Sills, RN  MD notified:  Dr. Barnet Glasgow at [phone]

## 2013-12-14 ENCOUNTER — Non-Acute Institutional Stay (SKILLED_NURSING_FACILITY): Payer: Medicare HMO | Admitting: Internal Medicine

## 2013-12-14 ENCOUNTER — Telehealth (HOSPITAL_COMMUNITY): Payer: Self-pay | Admitting: *Deleted

## 2013-12-14 ENCOUNTER — Encounter: Payer: Self-pay | Admitting: Internal Medicine

## 2013-12-14 DIAGNOSIS — D696 Thrombocytopenia, unspecified: Secondary | ICD-10-CM

## 2013-12-14 DIAGNOSIS — C9110 Chronic lymphocytic leukemia of B-cell type not having achieved remission: Secondary | ICD-10-CM

## 2013-12-14 DIAGNOSIS — D649 Anemia, unspecified: Secondary | ICD-10-CM

## 2013-12-14 DIAGNOSIS — I509 Heart failure, unspecified: Secondary | ICD-10-CM

## 2013-12-14 DIAGNOSIS — N189 Chronic kidney disease, unspecified: Secondary | ICD-10-CM

## 2013-12-14 DIAGNOSIS — I4891 Unspecified atrial fibrillation: Secondary | ICD-10-CM

## 2013-12-14 NOTE — Progress Notes (Signed)
Patient ID: [name], female   DOB: 01-07-1924, 80 y.o.   MRN: [id number]   This is a discharge note.  Level of care skilled.  Facility [facility]  Chief complaint distortional.  History of present illness.  Patient is a pleasant 80 year old female who came here after hospitalization for CHF-she was hospitalized at [facility] was given diuretics and continues on Lasix currently 60 mg a day this appears to be relatively stable her Lasix has been increased.  This is complicated with a history of renal insufficiency although most recent creatinine yesterday at 1.3 to is actually somewhat of an improvement from her baseline.  Clinically she seems to have done well.  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  .  Clinically she appears to have improved she does have a history of significant thrombocytopenia and anemia of fibula 28th her hemoglobin was 10.4 platelets 56,000 again this is followed closely by oncology.  She also has a history of atrial fibrillation however Coumadin was discontinued secondary to concerns about her significant thrombocytopenia with a bleeding risk.  She does continue on Coreg for rate control pulses run largely appears in the 90s occasionally slightly over 100 blood pressures appear to be stable although somewhat at times the low side she is asymptomatic.  She is going to an assisted living facility tomorrow it appears apparently they have assessed her and deemed her suitable for the level of care they can provide.  Family medical social history has been reviewed per admission note on 10/25/2013.   medications have been reviewed per [facility].  Review of systems.  In general patient is denying any fever or chills appears to be comfortable feeling relatively well.  Skin does  not complaining of any rashes or itching she does have some chronic bruising.  Respiratory does not complaining of shortness of breath or cough.  Cardiac did not complain of chest pain does have a history of major fibrillation.  GI does not complaining of any nausea vomiting diarrhea or constipation.  Muscle skeletal continues with fairly significant lower extremity weakness does not complaining of joint pain however.  Neurologic does not complaining of dizziness or syncopal type episodes or headache.  Psych apparently there is some history of mild dementia and confusion here.  Physical exam.  Temperature is 97.8 pulse 98 respirations 20 blood pressure 108/62 done manually weight is 144.6 this is up about 2 pounds since last week.  In general this is a somewhat obese elderly female in no distress sitting comfortably in her wheelchair.  Her skin is warm and dry.  Oropharynx is clear mucous membranes moist she has numerous extractions.  Her chest is clear to auscultation with somewhat reduced breath sounds there is no labored breathing.  Heart is irregular irregular rate and rhythm I could not appreciate a murmur gallop or rub pulse rate is in the high 90s-100-she has her legs wrapped apparently does have a history of significant lower extremity edema which has improved somewhat during her stay here and apparently has been helped by the wraps.  Her abdomen soft nontender positive bowel sounds.  Muscle skeletal continues with some lower extremity weakness-I do not note any deformities moves her upper extremities at baseline.  Neurologic is grossly intact no lateralizing findings speech is clear.  Physical exam.  12/13/2013.  Sodium  139 potassium 4.2 BUN 62 creatinine 1.32 this is somewhat down from previous creatinine 1.5-1.7.  Sedimentation rate 28 2015.  WBC 8.6 hemoglobin 10.4 platelets 56,000.  Assessment plan.  #1-history of CLL-this is followed by hematology  oncology-she is undergoing chemotherapy apparently is tolerating this fairly well this will have to be monitored closely--and oncology is following meds-they are monitoring her blood work and actually have a CBC scheduled for tomorrow clinically she appears stable.  #2  history CHF she is on Lasix weight has been relatively stable apparently she had quite significant edema on admission-clinically she appears stable although very fragile this will have to be monitored expediently as an outpatient--she continues on Lasix as well as a beta blocker and ACE inhibitor-also on Imdur.   #3-history of atrial fibrillation this is rate controlled -- she is not on anticoagulation secondary to concerns about her significant thrombocytopenia.--This has been evaluated by Dr. Dellia Nims.  4-renal insufficiency-this appears to be at baseline possibly slightly improved again this would be followup by her primary care provider.  Of note she will be going to assisted living would need expedient followed by her primary care provider she also is followed closely by hematology oncology again lab work has been ordered for tomorrow.  [id number] note greater than 30 minutes spent assessing patient discussing her status with her husband who is in the room with her as well as with nursing-and coordinating a plan of care

## 2013-12-14 NOTE — Telephone Encounter (Signed)
Call to [facility] to check on patient 24 hr post first chemo. Spoke with her Nurse, Suanne Marker and she relay that patient is stable. Slightly more lower extremity edema today and continues to have loose stools. VSS. Denies fever, chills, nausea or vomiting. No decision made yet re: movement to assisted living facility.

## 2013-12-15 ENCOUNTER — Non-Acute Institutional Stay (SKILLED_NURSING_FACILITY): Payer: Medicare HMO | Admitting: Internal Medicine

## 2013-12-15 DIAGNOSIS — I509 Heart failure, unspecified: Secondary | ICD-10-CM

## 2013-12-15 DIAGNOSIS — D696 Thrombocytopenia, unspecified: Secondary | ICD-10-CM

## 2013-12-15 DIAGNOSIS — N189 Chronic kidney disease, unspecified: Secondary | ICD-10-CM

## 2013-12-15 DIAGNOSIS — I4891 Unspecified atrial fibrillation: Secondary | ICD-10-CM

## 2013-12-16 ENCOUNTER — Inpatient Hospital Stay (HOSPITAL_COMMUNITY): Payer: Medicare HMO

## 2013-12-16 NOTE — Progress Notes (Addendum)
Patient ID: [name], female   DOB: [birth year], 80 y.o.   MRN: [id number]                  PROGRESS NOTE  DATE:  12/15/2013    FACILITY: [facility]    LEVEL OF CARE:   SNF   Acute Visit   CHIEF COMPLAINT:  Follow up thrombocytopenia, neutropenia.     HISTORY OF PRESENT ILLNESS:  I have reviewed [name] today and discussed her care with her oncologist.  She is a patient whom I admitted to the facility some months ago with atrial fibrillation and congestive heart failure, her predominant reason for hospitalization at [facility] in early January.  She was admitted with congestive heart failure and rapid atrial fibrillation.  This has not been a major problem while she has been here.     Lab work at [facility] showed a hemoglobin of 10.3, a white count of 10.3, a platelet count of 40,000.    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  She was started on weekly Rituxan for four cycles.    Lab work from 12/13/2013 showed a white count of 0.8, a hemoglobin of 10.3.  Her platelet count was 25,000.  Differential count was mostly neutrophils.    Repeat lab work done in our facility today showed a white count of 2.1 with 50% neutrophils, a hemoglobin of 9, and a platelet count of 25,000.    REVIEW OF SYSTEMS:   GENERAL:  She is not having any fevers, chills.   CHEST/RESPIRATORY:  No cough.  No sputum.   CARDIAC:   No chest pain.   GI:  No nausea, vomiting or diarrhea.    PHYSICAL EXAMINATION:   VITAL SIGNS:   BLOOD PRESSURE:  118/64.   PULSE:  91 and irregular.   TEMPERATURE:  97.6.   RESPIRATIONS:  18.   GENERAL APPEARANCE:  The patient is not in any distress.   CHEST/RESPIRATORY:  Clear air entry bilaterally, albeit shallow.   There is no wheezing or crackles.   CARDIOVASCULAR:  CARDIAC:   Heart sounds are  irregular.   She does not have a murmur.  There is no increase in Jugular venous pressure.   GASTROINTESTINAL:  ABDOMEN:   Soft.  No masses and no tenderness.  LIVER/SPLEEN/KIDNEYS:  No liver, no spleen.     ASSESSMENT/PLAN:  Congestive heart failure.  This at least seems stable.    Neutropenia and thrombocytopenia related to her underlying diagnosis and/or recent use of Rituxan.  I was amazed to see that the values were even worse  two days ago.  She is certainly at risk for complications of febrile neutropenia.    The patient was supposed to go to [facility] assisted living.  I expressed some concerns to the facility about this arrangement.  We could probably watch her better here until the courses of treatment for her lymphoma are complete.  Nevertheless, if she has a very involved doctor in [place], I would not totally be opposed to her returning to assisted living.     Chronic renal insufficiency.  Last lab work from 12/12/2013 showed a BUN of 62, a creatinine of 1.32.  Potassium was 4.2.  This is stable.

## 2013-12-20 ENCOUNTER — Encounter (HOSPITAL_BASED_OUTPATIENT_CLINIC_OR_DEPARTMENT_OTHER): Payer: Medicare HMO

## 2013-12-20 VITALS — BP 120/78 | HR 97 | Temp 97.4°F | Resp 20 | Wt 145.0 lb

## 2013-12-20 DIAGNOSIS — C9110 Chronic lymphocytic leukemia of B-cell type not having achieved remission: Secondary | ICD-10-CM

## 2013-12-20 DIAGNOSIS — C8307 Small cell B-cell lymphoma, spleen: Secondary | ICD-10-CM

## 2013-12-20 DIAGNOSIS — Z5112 Encounter for antineoplastic immunotherapy: Secondary | ICD-10-CM

## 2013-12-20 LAB — CBC WITH DIFFERENTIAL/PLATELET
BASOS ABS: 0 10*3/uL (ref 0.0–0.1)
Basophils Relative: 0 % (ref 0–1)
EOS ABS: 0.1 10*3/uL (ref 0.0–0.7)
EOS PCT: 3 % (ref 0–5)
HCT: 31 % — ABNORMAL LOW (ref 36.0–46.0)
Hemoglobin: 10.2 g/dL — ABNORMAL LOW (ref 12.0–15.0)
Lymphocytes Relative: 36 % (ref 12–46)
Lymphs Abs: 1.1 10*3/uL (ref 0.7–4.0)
MCH: 32.2 pg (ref 26.0–34.0)
MCHC: 32.9 g/dL (ref 30.0–36.0)
MCV: 97.8 fL (ref 78.0–100.0)
Monocytes Absolute: 0.4 10*3/uL (ref 0.1–1.0)
Monocytes Relative: 14 % — ABNORMAL HIGH (ref 3–12)
Neutro Abs: 1.4 10*3/uL — ABNORMAL LOW (ref 1.7–7.7)
Neutrophils Relative %: 47 % (ref 43–77)
PLATELETS: 60 10*3/uL — AB (ref 150–400)
RBC: 3.17 MIL/uL — ABNORMAL LOW (ref 3.87–5.11)
RDW: 15.5 % (ref 11.5–15.5)
WBC: 3 10*3/uL — ABNORMAL LOW (ref 4.0–10.5)

## 2013-12-20 MED ORDER — SODIUM CHLORIDE 0.9 % IV SOLN
Freq: Once | INTRAVENOUS | Status: AC
  Administered 2013-12-20: 10:00:00 via INTRAVENOUS

## 2013-12-20 MED ORDER — ACETAMINOPHEN 325 MG PO TABS
650.0000 mg | ORAL_TABLET | Freq: Once | ORAL | Status: AC
  Administered 2013-12-20: 650 mg via ORAL

## 2013-12-20 MED ORDER — SODIUM CHLORIDE 0.9 % IJ SOLN
10.0000 mL | INTRAMUSCULAR | Status: DC | PRN
  Administered 2013-12-20: 10 mL

## 2013-12-20 MED ORDER — DIPHENHYDRAMINE HCL 25 MG PO CAPS
50.0000 mg | ORAL_CAPSULE | Freq: Once | ORAL | Status: AC
  Administered 2013-12-20: 50 mg via ORAL

## 2013-12-20 MED ORDER — ACETAMINOPHEN 325 MG PO TABS
ORAL_TABLET | ORAL | Status: AC
  Filled 2013-12-20: qty 2

## 2013-12-20 MED ORDER — DIPHENHYDRAMINE HCL 25 MG PO CAPS
ORAL_CAPSULE | ORAL | Status: AC
  Filled 2013-12-20: qty 2

## 2013-12-20 MED ORDER — SODIUM CHLORIDE 0.9 % IV SOLN
375.0000 mg/m2 | Freq: Once | INTRAVENOUS | Status: AC
  Administered 2013-12-20: 600 mg via INTRAVENOUS
  Filled 2013-12-20: qty 50

## 2013-12-20 NOTE — Progress Notes (Signed)
Labs drawn today for cbc/diff 

## 2013-12-20 NOTE — Patient Instructions (Signed)
[facility] Discharge Instructions for Patients Receiving Chemotherapy  Today you received the following chemotherapy agents Rituxan   Continue to drink plenty of fluids- Try to get in 6-8 glasses a day.  If you develop nausea and vomiting that is not controlled by your nausea medication, call the clinic. If it is after clinic hours your family physician or the after hours number for the clinic or go to the Emergency Department.   BELOW ARE SYMPTOMS THAT SHOULD BE REPORTED IMMEDIATELY:  *FEVER GREATER THAN 101.0 F  *CHILLS WITH OR WITHOUT FEVER  NAUSEA AND VOMITING THAT IS NOT CONTROLLED WITH YOUR NAUSEA MEDICATION  *UNUSUAL SHORTNESS OF BREATH  *UNUSUAL BRUISING OR BLEEDING  TENDERNESS IN MOUTH AND THROAT WITH OR WITHOUT PRESENCE OF ULCERS  *URINARY PROBLEMS  *BOWEL PROBLEMS  UNUSUAL RASH Items with * indicate a potential emergency and should be followed up as soon as possible.  Return next Monday for next treatment.

## 2013-12-23 ENCOUNTER — Inpatient Hospital Stay (HOSPITAL_COMMUNITY): Payer: Medicare HMO

## 2013-12-27 ENCOUNTER — Encounter (HOSPITAL_COMMUNITY): Payer: Self-pay

## 2013-12-27 ENCOUNTER — Encounter (HOSPITAL_BASED_OUTPATIENT_CLINIC_OR_DEPARTMENT_OTHER): Payer: Medicare HMO

## 2013-12-27 VITALS — BP 118/84 | HR 119 | Temp 97.4°F | Resp 18 | Wt 140.4 lb

## 2013-12-27 DIAGNOSIS — C9110 Chronic lymphocytic leukemia of B-cell type not having achieved remission: Secondary | ICD-10-CM

## 2013-12-27 DIAGNOSIS — Z5112 Encounter for antineoplastic immunotherapy: Secondary | ICD-10-CM

## 2013-12-27 DIAGNOSIS — C8307 Small cell B-cell lymphoma, spleen: Secondary | ICD-10-CM

## 2013-12-27 DIAGNOSIS — D509 Iron deficiency anemia, unspecified: Secondary | ICD-10-CM

## 2013-12-27 DIAGNOSIS — D6959 Other secondary thrombocytopenia: Secondary | ICD-10-CM

## 2013-12-27 DIAGNOSIS — D72819 Decreased white blood cell count, unspecified: Secondary | ICD-10-CM

## 2013-12-27 LAB — CBC WITH DIFFERENTIAL/PLATELET
BASOS ABS: 0 10*3/uL (ref 0.0–0.1)
Basophils Relative: 1 % (ref 0–1)
EOS PCT: 3 % (ref 0–5)
Eosinophils Absolute: 0.1 10*3/uL (ref 0.0–0.7)
HCT: 30.3 % — ABNORMAL LOW (ref 36.0–46.0)
Hemoglobin: 10 g/dL — ABNORMAL LOW (ref 12.0–15.0)
LYMPHS PCT: 21 % (ref 12–46)
Lymphs Abs: 0.7 10*3/uL (ref 0.7–4.0)
MCH: 32.1 pg (ref 26.0–34.0)
MCHC: 33 g/dL (ref 30.0–36.0)
MCV: 97.1 fL (ref 78.0–100.0)
Monocytes Absolute: 0.4 10*3/uL (ref 0.1–1.0)
Monocytes Relative: 12 % (ref 3–12)
NEUTROS ABS: 2.2 10*3/uL (ref 1.7–7.7)
Neutrophils Relative %: 63 % (ref 43–77)
Platelets: 64 10*3/uL — ABNORMAL LOW (ref 150–400)
RBC: 3.12 MIL/uL — ABNORMAL LOW (ref 3.87–5.11)
RDW: 15.3 % (ref 11.5–15.5)
WBC: 3.5 10*3/uL — AB (ref 4.0–10.5)

## 2013-12-27 MED ORDER — SODIUM CHLORIDE 0.9 % IJ SOLN
10.0000 mL | INTRAMUSCULAR | Status: DC | PRN
  Administered 2013-12-27: 10 mL

## 2013-12-27 MED ORDER — ACETAMINOPHEN 325 MG PO TABS
650.0000 mg | ORAL_TABLET | Freq: Once | ORAL | Status: AC
  Administered 2013-12-27: 650 mg via ORAL

## 2013-12-27 MED ORDER — DIPHENHYDRAMINE HCL 25 MG PO CAPS
50.0000 mg | ORAL_CAPSULE | Freq: Once | ORAL | Status: AC
  Administered 2013-12-27: 50 mg via ORAL

## 2013-12-27 MED ORDER — SODIUM CHLORIDE 0.9 % IV SOLN
Freq: Once | INTRAVENOUS | Status: AC
  Administered 2013-12-27: 10:00:00 via INTRAVENOUS

## 2013-12-27 MED ORDER — DIPHENHYDRAMINE HCL 25 MG PO CAPS
ORAL_CAPSULE | ORAL | Status: AC
  Filled 2013-12-27: qty 2

## 2013-12-27 MED ORDER — SODIUM CHLORIDE 0.9 % IV SOLN
375.0000 mg/m2 | Freq: Once | INTRAVENOUS | Status: AC
  Administered 2013-12-27: 600 mg via INTRAVENOUS
  Filled 2013-12-27: qty 50

## 2013-12-27 MED ORDER — ACETAMINOPHEN 325 MG PO TABS
ORAL_TABLET | ORAL | Status: AC
  Filled 2013-12-27: qty 2

## 2013-12-27 NOTE — Progress Notes (Signed)
Labs drawn today for cbc/diff 

## 2013-12-27 NOTE — Patient Instructions (Signed)
[facility] Discharge Instructions for Patients Receiving Chemotherapy  Today you received the following chemotherapy agents Rituxan Call clinic if you develop any fever,rash or bleeding. Return to clinic as scheduled in 1 week for Rituxan infusion and MD appointment.  If you develop nausea and vomiting that is not controlled by your nausea medication, call the clinic. If it is after clinic hours your family physician or the after hours number for the clinic or go to the Emergency Department.   BELOW ARE SYMPTOMS THAT SHOULD BE REPORTED IMMEDIATELY:  *FEVER GREATER THAN 101.0 F  *CHILLS WITH OR WITHOUT FEVER  NAUSEA AND VOMITING THAT IS NOT CONTROLLED WITH YOUR NAUSEA MEDICATION  *UNUSUAL SHORTNESS OF BREATH  *UNUSUAL BRUISING OR BLEEDING  TENDERNESS IN MOUTH AND THROAT WITH OR WITHOUT PRESENCE OF ULCERS  *URINARY PROBLEMS  *BOWEL PROBLEMS  UNUSUAL RASH Items with * indicate a potential emergency and should be followed up as soon as possible.  One of the nurses will contact you 24 hours after your treatment. Please let the nurse know about any problems that you may have experienced. Feel free to call the clinic you have any questions or concerns. The clinic phone number is [phone].   I have been informed and understand all the instructions given to me. I know to contact the clinic, my physician, or go to the Emergency Department if any problems should occur. I do not have any questions at this time, but understand that I may call the clinic during office hours or the Patient Navigator at [phone] should I have any questions or need assistance in obtaining follow up care.    __________________________________________  _____________  __________ Signature of Patient or Authorized Representative            Date                   Time    __________________________________________ Nurse's Signature

## 2013-12-27 NOTE — Progress Notes (Signed)
[place], MD [place] [address]  DIAGNOSIS: Splenic marginal zone b-cell lymphoma  Chief Complaint  Patient presents with  . Splenic marginal zone lymphoma    Rituxan weekly, cycle #3 today  . Pancytopenia secondary to #1    CURRENT THERAPY: Rituxan IV weekly started on 12/13/2013.  INTERVAL HISTORY: [name] 80 y.o. female returns for followup and continuation of therapy for splenic marginal zone lymphoma and pancytopenia, receiving Rituxan intravenously weekly beginning on 12/13/2013. Thus far she has tolerated treatment well. She denies any easy satiety and according to her husband she is "eating like a pig." She does get short winded when walking with her walker. She denies any fever, night sweats, epistaxis, melena, hematochezia, hematuria, worsening lower extremity swelling or redness, chest pain, PND, orthopnea, or palpitations.  MEDICAL HISTORY: Past Medical History  Diagnosis Date  . GERD (gastroesophageal reflux disease)   . Carotid stenosis     a. 92011 Carotid u/s: Right 50-69%, left <50%  . Atrial fibrillation     a. chronic coumadin;  b. 07/2011 Echo: EF 65%, triv MR, mildly dil LA.  . Tachy-brady syndrome     a. 06/2010: SJM [employer] RF DR model [id number] (serial number [id number])  . Essential hypertension, benign   . Hypothyroidism     a. Dx 09/2013  . Lupus pt's daughter states she was diagnosed with this in her 82s     INTERIM HISTORY: has FIBRILLATION, ATRIAL; BRADYCARDIA-TACHYCARDIA SYNDROME; CAROTID ARTERY STENOSIS; PACEMAKER-[employer].Jude; Essential hypertension, benign; Encounter for long-term (current) use of anticoagulants; Atrial fibrillation with RVR; Anemia; Dyspnea; Hypothyroidism; AKI (acute kidney injury); Acute confusional state; Other dysphagia; Aspiration pneumonia; Thrombocytopenia; CLL (chronic lymphocytic leukemia); Splenic marginal zone b-cell  lymphoma; Chronic renal insufficiency; and CHF (congestive heart failure) on her problem list.    ALLERGIES:  is allergic to penicillins.  MEDICATIONS: has a current medication list which includes the following prescription(s): acetaminophen, benazepril, carvedilol, cholecalciferol, diphenhydramine, furosemide, hydrocortisone, isosorbide mononitrate, omeprazole, and clotrimazole-betamethasone, and the following Facility-Administered Medications: sodium chloride.  SURGICAL HISTORY:  Past Surgical History  Procedure Laterality Date  . Total knee arthroplasty      Right  . Laparoscopic cholecystectomy    . Pacemaker insertion  06/29/10    SJM implanted by Dr Rayann Heman for tachy/brady syndrome    FAMILY HISTORY: family history includes Other in an other family member.  SOCIAL HISTORY:  reports that she has never smoked. She has never used smokeless tobacco. She reports that she does not drink alcohol.  REVIEW OF SYSTEMS:  Other than that discussed above is noncontributory.  PHYSICAL EXAMINATION: ECOG PERFORMANCE STATUS: 1 - Symptomatic but completely ambulatory  There were no vitals taken for this visit.  GENERAL:alert, no distress and comfortable. Newly edentulous. SKIN: skin color, texture, turgor are normal, no rashes or significant lesions EYES: PERLA; Conjunctiva are pink and non-injected, sclera clear OROPHARYNX:no exudate, no erythema on lips, buccal mucosa, or tongue. NECK: supple, thyroid normal size, non-tender, without nodularity. No masses CHEST: No breast masses. LYMPH:  no palpable lymphadenopathy in the cervical, axillary or inguinal LUNGS: clear to auscultation and percussion with normal breathing effort HEART: Irregularly irregular with no S3. ABDOMEN:abdomen soft, non-tender and normal bowel sounds MUSCULOSKELETAL:no cyanosis of digits and no clubbing. Range of motion normal.  NEURO: alert & oriented x 3 with fluent speech, no focal motor/sensory  deficits   LABORATORY DATA: Infusion on  12/27/2013  Component Date Value Ref Range Status  . WBC 12/27/2013 3.5* 4.0 - 10.5 K/uL Final  . RBC 12/27/2013 3.12* 3.87 - 5.11 MIL/uL Final  . Hemoglobin 12/27/2013 10.0* 12.0 - 15.0 g/dL Final  . HCT 12/27/2013 30.3* 36.0 - 46.0 % Final  . MCV 12/27/2013 97.1  78.0 - 100.0 fL Final  . MCH 12/27/2013 32.1  26.0 - 34.0 pg Final  . MCHC 12/27/2013 33.0  30.0 - 36.0 g/dL Final  . RDW 12/27/2013 15.3  11.5 - 15.5 % Final  . Platelets 12/27/2013 64* 150 - 400 K/uL Final   CONSISTENT WITH PREVIOUS RESULT  . Neutrophils Relative % 12/27/2013 63  43 - 77 % Final  . Neutro Abs 12/27/2013 2.2  1.7 - 7.7 K/uL Final  . Lymphocytes Relative 12/27/2013 21  12 - 46 % Final  . Lymphs Abs 12/27/2013 0.7  0.7 - 4.0 K/uL Final  . Monocytes Relative 12/27/2013 12  3 - 12 % Final  . Monocytes Absolute 12/27/2013 0.4  0.1 - 1.0 K/uL Final  . Eosinophils Relative 12/27/2013 3  0 - 5 % Final  . Eosinophils Absolute 12/27/2013 0.1  0.0 - 0.7 K/uL Final  . Basophils Relative 12/27/2013 1  0 - 1 % Final  . Basophils Absolute 12/27/2013 0.0  0.0 - 0.1 K/uL Final  Infusion on 12/20/2013  Component Date Value Ref Range Status  . WBC 12/20/2013 3.0* 4.0 - 10.5 K/uL Final  . RBC 12/20/2013 3.17* 3.87 - 5.11 MIL/uL Final  . Hemoglobin 12/20/2013 10.2* 12.0 - 15.0 g/dL Final  . HCT 12/20/2013 31.0* 36.0 - 46.0 % Final  . MCV 12/20/2013 97.8  78.0 - 100.0 fL Final  . MCH 12/20/2013 32.2  26.0 - 34.0 pg Final  . MCHC 12/20/2013 32.9  30.0 - 36.0 g/dL Final  . RDW 12/20/2013 15.5  11.5 - 15.5 % Final  . Platelets 12/20/2013 60* 150 - 400 K/uL Final   Comment: SPECIMEN CHECKED FOR CLOTS                          PLATELET COUNT CONFIRMED BY SMEAR                          CONSISTENT WITH PREVIOUS RESULT  . Neutrophils Relative % 12/20/2013 47  43 - 77 % Final  . Neutro Abs 12/20/2013 1.4* 1.7 - 7.7 K/uL Final  . Lymphocytes Relative 12/20/2013 36  12 - 46 % Final  .  Lymphs Abs 12/20/2013 1.1  0.7 - 4.0 K/uL Final  . Monocytes Relative 12/20/2013 14* 3 - 12 % Final  . Monocytes Absolute 12/20/2013 0.4  0.1 - 1.0 K/uL Final  . Eosinophils Relative 12/20/2013 3  0 - 5 % Final  . Eosinophils Absolute 12/20/2013 0.1  0.0 - 0.7 K/uL Final  . Basophils Relative 12/20/2013 0  0 - 1 % Final  . Basophils Absolute 12/20/2013 0.0  0.0 - 0.1 K/uL Final  Infusion on 12/13/2013  Component Date Value Ref Range Status  . WBC 12/13/2013 0.8* 4.0 - 10.5 K/uL Final   Comment: WHITE COUNT CONFIRMED ON SMEAR                          CRITICAL RESULT CALLED TO, READ BACK BY AND VERIFIED WITH:  ANDERSON A. AT 1204P ON [id number] BY THOMPSON S.  . RBC 12/13/2013 3.20* 3.87 - 5.11 MIL/uL Final  . Hemoglobin 12/13/2013 10.3* 12.0 - 15.0 g/dL Final  . HCT 12/13/2013 32.0* 36.0 - 46.0 % Final  . MCV 12/13/2013 100.0  78.0 - 100.0 fL Final  . MCHC 12/13/2013 32.2  30.0 - 36.0 g/dL Final  . RDW 12/13/2013 15.8* 11.5 - 15.5 % Final  . Platelets 12/13/2013 27* 150 - 400 K/uL Final   Comment: PLATELET COUNT CONFIRMED BY SMEAR                          CRITICAL RESULT CALLED TO, READ BACK BY AND VERIFIED WITH:                          ANDERSON A. AT 1204P ON [id number] BY THOMPSON S.  . Neutrophils Relative % 12/13/2013 28* 43 - 77 % Final  . Neutro Abs 12/13/2013 0.2* 1.7 - 7.7 K/uL Final  . Lymphocytes Relative 12/13/2013 68* 12 - 46 % Final  . Lymphs Abs 12/13/2013 0.6* 0.7 - 4.0 K/uL Final  . Monocytes Relative 12/13/2013 4  3 - 12 % Final  . Monocytes Absolute 12/13/2013 0.0* 0.1 - 1.0 K/uL Final  . Eosinophils Relative 12/13/2013 1  0 - 5 % Final  . Eosinophils Absolute 12/13/2013 0.0  0.0 - 0.7 K/uL Final  . Basophils Relative 12/13/2013 0  0 - 1 % Final  . Basophils Absolute 12/13/2013 0.0  0.0 - 0.1 K/uL Final  . RBC Morphology 58/85/0277 BASOPHILIC STIPPLING   Final   TEARDROP CELLS  . Smear Review 12/13/2013 PERFORMED   Final  . LDH 12/13/2013 205  94  - 250 U/L Final  . Iron 12/13/2013 33* 42 - 135 ug/dL Final  . TIBC 12/13/2013 247* 250 - 470 ug/dL Final  . Saturation Ratios 12/13/2013 13* 20 - 55 % Final  . UIBC 12/13/2013 214  125 - 400 ug/dL Final   Performed at [facility]  . Ferritin 12/13/2013 120  10 - 291 ng/mL Final   Performed at [facility] on 12/07/2013  Component Date Value Ref Range Status  . WBC 12/07/2013 6.5  4.0 - 10.5 K/uL Final  . RBC 12/07/2013 3.00* 3.87 - 5.11 MIL/uL Final  . Hemoglobin 12/07/2013 9.7* 12.0 - 15.0 g/dL Final  . HCT 12/07/2013 30.5* 36.0 - 46.0 % Final  . MCV 12/07/2013 101.7* 78.0 - 100.0 fL Final  . MCH 12/07/2013 32.3  26.0 - 34.0 pg Final  . MCHC 12/07/2013 31.8  30.0 - 36.0 g/dL Final  . RDW 12/07/2013 16.9* 11.5 - 15.5 % Final  . Platelets 12/07/2013 49* 150 - 400 K/uL Final   Comment: SPECIMEN CHECKED FOR CLOTS                          CONSISTENT WITH PREVIOUS RESULT  . Neutrophils Relative % 12/07/2013 28* 43 - 77 % Final  . Neutro Abs 12/07/2013 1.8  1.7 - 7.7 K/uL Final  . Lymphocytes Relative 12/07/2013 67* 12 - 46 % Final  . Lymphs Abs 12/07/2013 4.4* 0.7 - 4.0 K/uL Final  . Monocytes Relative 12/07/2013 4  3 - 12 % Final  . Monocytes Absolute 12/07/2013 0.3  0.1 - 1.0 K/uL Final  . Eosinophils Relative 12/07/2013 1  0 - 5 % Final  .  Eosinophils Absolute 12/07/2013 0.1  0.0 - 0.7 K/uL Final  . Basophils Relative 12/07/2013 0  0 - 1 % Final  . Basophils Absolute 12/07/2013 0.0  0.0 - 0.1 K/uL Final  . Retic Ct Pct 12/07/2013 1.9  0.4 - 3.1 % Final  . RBC. 12/07/2013 3.00* 3.87 - 5.11 MIL/uL Final  . Retic Count, Manual 12/07/2013 57.0  19.0 - 186.0 K/uL Final  . Hepatitis B Surface Ag 12/07/2013 NEGATIVE  NEGATIVE Final   Performed at [facility]  . Hep B C IgM 12/07/2013 NON REACTIVE  NON REACTIVE Final   Comment: (NOTE)                          High levels of Hepatitis B Core IgM antibody are detectable                          during  the acute stage of Hepatitis B. This antibody is used                          to differentiate current from past HBV infection.                          Performed at [place]: No new pathology.  Urinalysis    Component Value Date/Time   COLORURINE YELLOW 06/29/2010 1025   APPEARANCEUR CLEAR 06/29/2010 1025   LABSPEC 1.014 06/29/2010 1025   PHURINE 6.0 06/29/2010 1025   GLUCOSEU NEGATIVE 06/29/2010 1025   HGBUR TRACE* 06/29/2010 1025   BILIRUBINUR NEGATIVE 06/29/2010 1025   KETONESUR NEGATIVE 06/29/2010 1025   PROTEINUR NEGATIVE 06/29/2010 1025   UROBILINOGEN 1.0 06/29/2010 1025   NITRITE NEGATIVE 06/29/2010 1025   LEUKOCYTESUR NEGATIVE 06/29/2010 1025    RADIOGRAPHIC STUDIES: Dg Chest 1 View  12/02/2013   CLINICAL DATA:  Wheezing  EXAM: CHEST - 1 VIEW  COMPARISON:  10/30/2013  FINDINGS: Right basilar consolidation and pleural effusion are stable. Left basilar opacity has improved. Persistent low lung volumes. Prominent heart. Dual lead left subclavian pacemaker device and lead stable and intact. No pneumothorax.  IMPRESSION: Improved pleural and parenchymal disease at the left base. Stable right pleural effusion and basilar consolidation.   Electronically Signed   By: Maryclare Bean M.D.   On: 12/02/2013 10:41    ASSESSMENT:  1. Primary splenic lymphoma (marginal zone lymphoma ) with leukemic manifestations manifesting as relative lymphocytosis in the setting of a normal total white cell count with thrombocytopenia and anemia, tolerating Rituxan well. 2. Macrocytic anemia, secondary to #1  3. Thrombocytopenia, secondary to #1.  4. Leukopenia, secondary to #1   PLAN:  #1. Rituxan cycle #3 today. #2. Followup in one week with CBC and cycle #4 of Rituxan. #3. Call if develops fever, bleeding, or chills.   All questions were answered. The patient knows to call the clinic with any problems, questions or concerns. We can certainly see the patient much sooner if  necessary.   I spent 25 minutes counseling the patient face to face. The total time spent in the appointment was 30 minutes.    Doroteo Bradford, MD 12/27/2013 11:27 AM

## 2013-12-27 NOTE — Progress Notes (Signed)
Tolerated well

## 2013-12-29 ENCOUNTER — Telehealth: Payer: Self-pay | Admitting: Internal Medicine

## 2013-12-29 NOTE — Telephone Encounter (Signed)
Patient has been in and out of hosp per husband.  [name] that she should be stable enough to come to appointments in about a month

## 2013-12-30 ENCOUNTER — Inpatient Hospital Stay (HOSPITAL_COMMUNITY): Payer: Medicare HMO

## 2014-01-03 ENCOUNTER — Encounter (HOSPITAL_BASED_OUTPATIENT_CLINIC_OR_DEPARTMENT_OTHER): Payer: Medicare HMO

## 2014-01-03 ENCOUNTER — Encounter (HOSPITAL_BASED_OUTPATIENT_CLINIC_OR_DEPARTMENT_OTHER): Payer: Medicare HMO | Admitting: Oncology

## 2014-01-03 VITALS — BP 94/54 | HR 102 | Temp 97.2°F | Resp 20 | Wt 145.8 lb

## 2014-01-03 DIAGNOSIS — Z5112 Encounter for antineoplastic immunotherapy: Secondary | ICD-10-CM

## 2014-01-03 DIAGNOSIS — D696 Thrombocytopenia, unspecified: Secondary | ICD-10-CM

## 2014-01-03 DIAGNOSIS — D649 Anemia, unspecified: Secondary | ICD-10-CM

## 2014-01-03 DIAGNOSIS — C8307 Small cell B-cell lymphoma, spleen: Secondary | ICD-10-CM

## 2014-01-03 DIAGNOSIS — D72819 Decreased white blood cell count, unspecified: Secondary | ICD-10-CM

## 2014-01-03 LAB — DIFFERENTIAL
BASOS ABS: 0 10*3/uL (ref 0.0–0.1)
BASOS PCT: 0 % (ref 0–1)
Eosinophils Absolute: 0.1 10*3/uL (ref 0.0–0.7)
Eosinophils Relative: 4 % (ref 0–5)
LYMPHS PCT: 24 % (ref 12–46)
Lymphs Abs: 0.8 10*3/uL (ref 0.7–4.0)
MONO ABS: 0.4 10*3/uL (ref 0.1–1.0)
Monocytes Relative: 11 % (ref 3–12)
Neutro Abs: 2 10*3/uL (ref 1.7–7.7)
Neutrophils Relative %: 60 % (ref 43–77)

## 2014-01-03 LAB — CBC
HEMATOCRIT: 31.4 % — AB (ref 36.0–46.0)
Hemoglobin: 10.3 g/dL — ABNORMAL LOW (ref 12.0–15.0)
MCH: 31.8 pg (ref 26.0–34.0)
MCHC: 32.8 g/dL (ref 30.0–36.0)
MCV: 96.9 fL (ref 78.0–100.0)
PLATELETS: 61 10*3/uL — AB (ref 150–400)
RBC: 3.24 MIL/uL — ABNORMAL LOW (ref 3.87–5.11)
RDW: 14.8 % (ref 11.5–15.5)
WBC: 3.2 10*3/uL — AB (ref 4.0–10.5)

## 2014-01-03 MED ORDER — DIPHENHYDRAMINE HCL 25 MG PO CAPS
ORAL_CAPSULE | ORAL | Status: AC
  Filled 2014-01-03: qty 2

## 2014-01-03 MED ORDER — SODIUM CHLORIDE 0.9 % IV SOLN
Freq: Once | INTRAVENOUS | Status: AC
  Administered 2014-01-03: 09:00:00 via INTRAVENOUS

## 2014-01-03 MED ORDER — ACETAMINOPHEN 325 MG PO TABS
650.0000 mg | ORAL_TABLET | Freq: Once | ORAL | Status: AC
  Administered 2014-01-03: 650 mg via ORAL

## 2014-01-03 MED ORDER — DIPHENHYDRAMINE HCL 25 MG PO CAPS
50.0000 mg | ORAL_CAPSULE | Freq: Once | ORAL | Status: AC
  Administered 2014-01-03: 50 mg via ORAL

## 2014-01-03 MED ORDER — ACETAMINOPHEN 325 MG PO TABS
ORAL_TABLET | ORAL | Status: AC
  Filled 2014-01-03: qty 2

## 2014-01-03 MED ORDER — RITUXIMAB CHEMO INJECTION 10 MG/ML
375.0000 mg/m2 | Freq: Once | INTRAVENOUS | Status: AC
  Administered 2014-01-03: 600 mg via INTRAVENOUS
  Filled 2014-01-03: qty 50

## 2014-01-03 MED ORDER — SODIUM CHLORIDE 0.9 % IJ SOLN: 10.0000 mL | INTRAMUSCULAR | Status: DC | PRN

## 2014-01-03 NOTE — Progress Notes (Unsigned)
1215 - Tolerated Rituxan infusion well.  Alert, in no distress. VSS.

## 2014-01-03 NOTE — Progress Notes (Signed)
Labs drawn today for cbc/diff 

## 2014-01-03 NOTE — Progress Notes (Signed)
RAMA,CHRISTINA, MD [place] [address]  Splenic marginal zone b-cell lymphoma - Plan: CBC with Differential, CBC with Differential, Comprehensive metabolic panel, Lactate dehydrogenase, Beta 2 microglobuline, serum  CURRENT THERAPY: Rituxan weekly x 4 beginning on 12/13/2013.  Today is 4th cycle.  INTERVAL HISTORY: [name] 80 y.o. female returns for  regular  visit for followup of and continuation of therapy for splenic marginal zone lymphoma and pancytopenia, receiving Rituxan intravenously weekly beginning on 12/13/2013.    Splenic marginal zone b-cell lymphoma   10/29/2013 Initial Diagnosis Splenic marginal zone b-cell lymphoma   12/13/2013 -  Chemotherapy Rituxan weekly x 4   I personally reviewed and went over laboratory results with the patient.  The results are noted within this dictation.  She denies any complaints.  Her appetite remains strong.  She denies any bleeding or rashes.  She denies any fevers, chills, nigh sweats.  She will receive cycle 4 of Rituxan today as scheduled.  This will complete her treatment at this time.  We will monitor her labs moving forward.    Hematologically, she denies any complaints and ROS questioning is negative.   Past Medical History  Diagnosis Date  . GERD (gastroesophageal reflux disease)   . Carotid stenosis     a. 92011 Carotid u/s: Right 50-69%, left <50%  . Atrial fibrillation     a. chronic coumadin;  b. 07/2011 Echo: EF 65%, triv MR, mildly dil LA.  . Tachy-brady syndrome     a. 06/2010: SJM [employer] RF DR model [id number] (serial number [id number])  . Essential hypertension, benign   . Hypothyroidism     a. Dx 09/2013  . Lupus pt's daughter states she was diagnosed with this in her 37s     has FIBRILLATION, ATRIAL; BRADYCARDIA-TACHYCARDIA SYNDROME; CAROTID ARTERY STENOSIS; PACEMAKER-[employer].Jude; Essential hypertension, benign; Encounter for long-term (current) use of anticoagulants; Atrial fibrillation with RVR;  Anemia; Dyspnea; Hypothyroidism; AKI (acute kidney injury); Acute confusional state; Other dysphagia; Aspiration pneumonia; Thrombocytopenia; CLL (chronic lymphocytic leukemia); Splenic marginal zone b-cell lymphoma; Chronic renal insufficiency; and CHF (congestive heart failure) on her problem list.     is allergic to penicillins.  Ms. Brau does not currently have medications on file.  Past Surgical History  Procedure Laterality Date  . Total knee arthroplasty      Right  . Laparoscopic cholecystectomy    . Pacemaker insertion  06/29/10    SJM implanted by Dr Rayann Heman for tachy/brady syndrome    Denies any headaches, dizziness, double vision, fevers, chills, night sweats, nausea, vomiting, diarrhea, constipation, chest pain, heart palpitations, shortness of breath, blood in stool, black tarry stool, urinary pain, urinary burning, urinary frequency, hematuria.   PHYSICAL EXAMINATION  ECOG PERFORMANCE STATUS: 3 - Symptomatic, >50% confined to bed  There were no vitals filed for this visit.  GENERAL:alert, no distress, well nourished, well developed, comfortable, cooperative and smiling SKIN: skin color, texture, turgor are normal, no rashes or significant lesions HEAD: Normocephalic, No masses, lesions, tenderness or abnormalities EYES: normal, PERRLA, EOMI, Conjunctiva are pink and non-injected EARS: External ears normal OROPHARYNX:mucous membranes are moist  NECK: supple, trachea midline LYMPH:  not examined BREAST:not examined LUNGS: clear to auscultation  HEART: regular rate & rhythm, no murmurs and no gallops ABDOMEN:abdomen soft, non-tender, obese and normal bowel sounds BACK: Back symmetric, no curvature. EXTREMITIES:less then 2 second capillary refill, no joint deformities, effusion, or inflammation, no skin discoloration  NEURO: alert & oriented x 3 with fluent  speech, no focal motor/sensory deficits    LABORATORY DATA: CBC    Component Value Date/Time   WBC 3.5*  12/27/2013 1002   RBC 3.12* 12/27/2013 1002   RBC 3.00* 12/07/2013 1037   HGB 10.0* 12/27/2013 1002   HCT 30.3* 12/27/2013 1002   PLT 64* 12/27/2013 1002   MCV 97.1 12/27/2013 1002   MCH 32.1 12/27/2013 1002   MCHC 33.0 12/27/2013 1002   RDW 15.3 12/27/2013 1002   LYMPHSABS 0.7 12/27/2013 1002   MONOABS 0.4 12/27/2013 1002   EOSABS 0.1 12/27/2013 1002   BASOSABS 0.0 12/27/2013 1002      Chemistry      Component Value Date/Time   NA 145 10/28/2013 1340   K 4.8 10/28/2013 1340   CL 104 10/28/2013 1340   CO2 30 10/28/2013 1340   BUN 64* 10/28/2013 1340   CREATININE 1.73* 10/28/2013 1340      Component Value Date/Time   CALCIUM 9.3 10/28/2013 1340   ALKPHOS 71 10/28/2013 1340   AST 17 10/28/2013 1340   ALT 9 10/28/2013 1340   BILITOT 0.8 10/28/2013 1340         ASSESSMENT:  1. Primary splenic lymphoma (marginal zone lymphoma ) with leukemic manifestations manifesting as relative lymphocytosis in the setting of a normal total white cell count with thrombocytopenia and anemia, tolerating Rituxan well.  2. Macrocytic anemia, secondary to #1.  Now normocytic 3. Thrombocytopenia, secondary to #1. Improved. 4. Leukopenia, secondary to #1.  Now with normal differential.   PLAN:  1. I personally reviewed and went over laboratory results with the patient.  The results are noted within this dictation. 2. Administration of IV Rituxan today for cycle 4. 3. Labs today: CBC diff 4. Labs in 4 weeks: CBC diff, CMET, LDH, B2M 5. Return in 4 weeks for follow-up.    THERAPY PLAN:  She will complete Rituxan therapy today (4 cycles weekly).  We will repeat labs in 4 weeks and see her back in 4 weeks.  She has completed therapy at this time following today's infusion.  All questions were answered. The patient knows to call the clinic with any problems, questions or concerns. We can certainly see the patient much sooner if necessary.  Patient and plan discussed with Dr. Farrel Gobble and he is in agreement  with the aforementioned.   [name] 01/03/2014

## 2014-01-03 NOTE — Patient Instructions (Signed)
[facility] Discharge Instructions  RECOMMENDATIONS MADE BY THE CONSULTANT AND ANY TEST RESULTS WILL BE SENT TO YOUR REFERRING PHYSICIAN.  We will see you in 4 weeks and do labs at that time.  Thank you for choosing [facility] to provide your oncology and hematology care.  To afford each patient quality time with our providers, please arrive at least 15 minutes before your scheduled appointment time.  With your help, our goal is to use those 15 minutes to complete the necessary work-up to ensure our physicians have the information they need to help with your evaluation and healthcare recommendations.    Effective January 1st, 2014, we ask that you re-schedule your appointment with our physicians should you arrive 10 or more minutes late for your appointment.  We strive to give you quality time with our providers, and arriving late affects you and other patients whose appointments are after yours.    Again, thank you for choosing [facility].  Our hope is that these requests will decrease the amount of time that you wait before being seen by our physicians.       _____________________________________________________________  Should you have questions after your visit to [facility], please contact our office at [phone] between the hours of 8:30 a.m. and 5:00 p.m.  Voicemails left after 4:30 p.m. will not be returned until the following business day.  For prescription refill requests, have your pharmacy contact our office with your prescription refill request.

## 2014-01-07 ENCOUNTER — Encounter (HOSPITAL_COMMUNITY): Payer: Self-pay | Admitting: *Deleted

## 2014-01-18 ENCOUNTER — Ambulatory Visit (HOSPITAL_COMMUNITY): Payer: Medicare HMO

## 2014-01-21 ENCOUNTER — Telehealth (HOSPITAL_COMMUNITY): Payer: Self-pay | Admitting: *Deleted

## 2014-01-21 NOTE — Telephone Encounter (Signed)
Daughter called to let us know that her mother [name] has pneumonia and is on an antibiotic and she also has cellulitis of her foot. Just FYI. She sees [place] next week.

## 2014-01-25 ENCOUNTER — Encounter (HOSPITAL_COMMUNITY): Payer: Medicare HMO | Attending: Hematology and Oncology

## 2014-01-25 ENCOUNTER — Encounter (HOSPITAL_BASED_OUTPATIENT_CLINIC_OR_DEPARTMENT_OTHER): Payer: Medicare HMO

## 2014-01-25 ENCOUNTER — Emergency Department (HOSPITAL_COMMUNITY): Payer: Medicare HMO

## 2014-01-25 ENCOUNTER — Inpatient Hospital Stay (HOSPITAL_COMMUNITY)
Admission: EM | Admit: 2014-01-25 | Discharge: 2014-02-11 | DRG: 871 | Disposition: E | Payer: Medicare HMO | Attending: Internal Medicine | Admitting: Internal Medicine

## 2014-01-25 ENCOUNTER — Encounter (HOSPITAL_COMMUNITY): Payer: Self-pay | Admitting: Emergency Medicine

## 2014-01-25 VITALS — BP 81/54 | HR 113 | Temp 94.0°F | Resp 20

## 2014-01-25 DIAGNOSIS — Z950 Presence of cardiac pacemaker: Secondary | ICD-10-CM

## 2014-01-25 DIAGNOSIS — K219 Gastro-esophageal reflux disease without esophagitis: Secondary | ICD-10-CM | POA: Diagnosis present

## 2014-01-25 DIAGNOSIS — I369 Nonrheumatic tricuspid valve disorder, unspecified: Secondary | ICD-10-CM

## 2014-01-25 DIAGNOSIS — J189 Pneumonia, unspecified organism: Secondary | ICD-10-CM | POA: Diagnosis present

## 2014-01-25 DIAGNOSIS — D72819 Decreased white blood cell count, unspecified: Secondary | ICD-10-CM

## 2014-01-25 DIAGNOSIS — D61818 Other pancytopenia: Secondary | ICD-10-CM | POA: Diagnosis present

## 2014-01-25 DIAGNOSIS — N170 Acute kidney failure with tubular necrosis: Secondary | ICD-10-CM | POA: Diagnosis present

## 2014-01-25 DIAGNOSIS — F0390 Unspecified dementia without behavioral disturbance: Secondary | ICD-10-CM | POA: Diagnosis present

## 2014-01-25 DIAGNOSIS — Z7901 Long term (current) use of anticoagulants: Secondary | ICD-10-CM

## 2014-01-25 DIAGNOSIS — N189 Chronic kidney disease, unspecified: Secondary | ICD-10-CM

## 2014-01-25 DIAGNOSIS — Z8249 Family history of ischemic heart disease and other diseases of the circulatory system: Secondary | ICD-10-CM | POA: Diagnosis not present

## 2014-01-25 DIAGNOSIS — Z66 Do not resuscitate: Secondary | ICD-10-CM | POA: Diagnosis present

## 2014-01-25 DIAGNOSIS — I129 Hypertensive chronic kidney disease with stage 1 through stage 4 chronic kidney disease, or unspecified chronic kidney disease: Secondary | ICD-10-CM | POA: Diagnosis present

## 2014-01-25 DIAGNOSIS — C9110 Chronic lymphocytic leukemia of B-cell type not having achieved remission: Secondary | ICD-10-CM

## 2014-01-25 DIAGNOSIS — I5023 Acute on chronic systolic (congestive) heart failure: Secondary | ICD-10-CM | POA: Diagnosis present

## 2014-01-25 DIAGNOSIS — I4891 Unspecified atrial fibrillation: Secondary | ICD-10-CM | POA: Diagnosis present

## 2014-01-25 DIAGNOSIS — R6520 Severe sepsis without septic shock: Secondary | ICD-10-CM

## 2014-01-25 DIAGNOSIS — Z96659 Presence of unspecified artificial knee joint: Secondary | ICD-10-CM

## 2014-01-25 DIAGNOSIS — I509 Heart failure, unspecified: Secondary | ICD-10-CM | POA: Diagnosis present

## 2014-01-25 DIAGNOSIS — I495 Sick sinus syndrome: Secondary | ICD-10-CM | POA: Diagnosis present

## 2014-01-25 DIAGNOSIS — A419 Sepsis, unspecified organism: Principal | ICD-10-CM

## 2014-01-25 DIAGNOSIS — D696 Thrombocytopenia, unspecified: Secondary | ICD-10-CM

## 2014-01-25 DIAGNOSIS — C8307 Small cell B-cell lymphoma, spleen: Secondary | ICD-10-CM

## 2014-01-25 DIAGNOSIS — N184 Chronic kidney disease, stage 4 (severe): Secondary | ICD-10-CM | POA: Diagnosis present

## 2014-01-25 DIAGNOSIS — N179 Acute kidney failure, unspecified: Secondary | ICD-10-CM

## 2014-01-25 DIAGNOSIS — E039 Hypothyroidism, unspecified: Secondary | ICD-10-CM | POA: Diagnosis present

## 2014-01-25 DIAGNOSIS — R0602 Shortness of breath: Secondary | ICD-10-CM | POA: Diagnosis present

## 2014-01-25 DIAGNOSIS — E861 Hypovolemia: Secondary | ICD-10-CM | POA: Diagnosis present

## 2014-01-25 DIAGNOSIS — M329 Systemic lupus erythematosus, unspecified: Secondary | ICD-10-CM | POA: Diagnosis present

## 2014-01-25 DIAGNOSIS — C8589 Other specified types of non-Hodgkin lymphoma, extranodal and solid organ sites: Secondary | ICD-10-CM | POA: Diagnosis present

## 2014-01-25 DIAGNOSIS — R1319 Other dysphagia: Secondary | ICD-10-CM | POA: Diagnosis present

## 2014-01-25 DIAGNOSIS — R6521 Severe sepsis with septic shock: Secondary | ICD-10-CM

## 2014-01-25 DIAGNOSIS — D649 Anemia, unspecified: Secondary | ICD-10-CM

## 2014-01-25 DIAGNOSIS — I10 Essential (primary) hypertension: Secondary | ICD-10-CM | POA: Diagnosis present

## 2014-01-25 HISTORY — DX: Heart failure, unspecified: I50.9

## 2014-01-25 HISTORY — DX: Unspecified B-cell lymphoma, unspecified site: C85.10

## 2014-01-25 LAB — BASIC METABOLIC PANEL
BUN: 98 mg/dL — ABNORMAL HIGH (ref 6–23)
CO2: 28 mEq/L (ref 19–32)
CREATININE: 2.83 mg/dL — AB (ref 0.50–1.10)
Calcium: 8.6 mg/dL (ref 8.4–10.5)
Chloride: 99 mEq/L (ref 96–112)
GFR calc Af Amer: 16 mL/min — ABNORMAL LOW (ref >90)
GFR, EST NON AFRICAN AMERICAN: 14 mL/min — AB (ref >90)
Glucose, Bld: 92 mg/dL (ref 70–99)
POTASSIUM: 5 meq/L (ref 3.7–5.3)
Sodium: 138 mEq/L (ref 137–147)

## 2014-01-25 LAB — COMPREHENSIVE METABOLIC PANEL
ALT: 10 U/L (ref 0–35)
AST: 14 U/L (ref 0–37)
Albumin: 2.8 g/dL — ABNORMAL LOW (ref 3.5–5.2)
Alkaline Phosphatase: 67 U/L (ref 39–117)
BUN: 97 mg/dL — ABNORMAL HIGH (ref 6–23)
CO2: 28 mEq/L (ref 19–32)
Calcium: 8.8 mg/dL (ref 8.4–10.5)
Chloride: 97 mEq/L (ref 96–112)
Creatinine, Ser: 2.85 mg/dL — ABNORMAL HIGH (ref 0.50–1.10)
GFR calc Af Amer: 16 mL/min — ABNORMAL LOW (ref >90)
GFR calc non Af Amer: 14 mL/min — ABNORMAL LOW (ref >90)
Glucose, Bld: 101 mg/dL — ABNORMAL HIGH (ref 70–99)
Potassium: 4.9 mEq/L (ref 3.7–5.3)
SODIUM: 137 meq/L (ref 137–147)
TOTAL PROTEIN: 6.3 g/dL (ref 6.0–8.3)
Total Bilirubin: 0.5 mg/dL (ref 0.3–1.2)

## 2014-01-25 LAB — CBC WITH DIFFERENTIAL/PLATELET
BASOS ABS: 0 10*3/uL (ref 0.0–0.1)
BASOS PCT: 0 % (ref 0–1)
Basophils Absolute: 0 10*3/uL (ref 0.0–0.1)
Basophils Relative: 0 % (ref 0–1)
EOS ABS: 0 10*3/uL (ref 0.0–0.7)
Eosinophils Absolute: 0 10*3/uL (ref 0.0–0.7)
Eosinophils Relative: 0 % (ref 0–5)
Eosinophils Relative: 1 % (ref 0–5)
HCT: 33.9 % — ABNORMAL LOW (ref 36.0–46.0)
HCT: 33.4 % — ABNORMAL LOW (ref 36.0–46.0)
Hemoglobin: 11 g/dL — ABNORMAL LOW (ref 12.0–15.0)
Hemoglobin: 10.8 g/dL — ABNORMAL LOW (ref 12.0–15.0)
Lymphocytes Relative: 30 % (ref 12–46)
Lymphocytes Relative: 25 % (ref 12–46)
Lymphs Abs: 0.3 10*3/uL — ABNORMAL LOW (ref 0.7–4.0)
Lymphs Abs: 0.3 10*3/uL — ABNORMAL LOW (ref 0.7–4.0)
MCH: 31 pg (ref 26.0–34.0)
MCH: 30.8 pg (ref 26.0–34.0)
MCHC: 32.4 g/dL (ref 30.0–36.0)
MCHC: 32.3 g/dL (ref 30.0–36.0)
MCV: 95.5 fL (ref 78.0–100.0)
MCV: 95.2 fL (ref 78.0–100.0)
MONO ABS: 0.3 10*3/uL (ref 0.1–1.0)
Monocytes Absolute: 0.2 10*3/uL (ref 0.1–1.0)
Monocytes Relative: 22 % — ABNORMAL HIGH (ref 3–12)
Monocytes Relative: 29 % — ABNORMAL HIGH (ref 3–12)
NEUTROS ABS: 0.5 10*3/uL — AB (ref 1.7–7.7)
NEUTROS PCT: 48 % (ref 43–77)
Neutro Abs: 0.5 10*3/uL — ABNORMAL LOW (ref 1.7–7.7)
Neutrophils Relative %: 45 % (ref 43–77)
PLATELETS: 51 10*3/uL — AB (ref 150–400)
PLATELETS: 48 10*3/uL — AB (ref 150–400)
RBC: 3.55 MIL/uL — AB (ref 3.87–5.11)
RBC: 3.51 MIL/uL — ABNORMAL LOW (ref 3.87–5.11)
RDW: 15 % (ref 11.5–15.5)
RDW: 14.9 % (ref 11.5–15.5)
Smear Review: DECREASED
WBC: 1.1 10*3/uL — CL (ref 4.0–10.5)
WBC: 1 10*3/uL — AB (ref 4.0–10.5)

## 2014-01-25 LAB — PROTIME-INR
INR: 1.36 (ref 0.00–1.49)
Prothrombin Time: 16.4 seconds — ABNORMAL HIGH (ref 11.6–15.2)

## 2014-01-25 LAB — TROPONIN I

## 2014-01-25 LAB — URINALYSIS, ROUTINE W REFLEX MICROSCOPIC
BILIRUBIN URINE: NEGATIVE
Glucose, UA: NEGATIVE mg/dL
Hgb urine dipstick: NEGATIVE
Ketones, ur: NEGATIVE mg/dL
NITRITE: NEGATIVE
Protein, ur: 100 mg/dL — AB
Specific Gravity, Urine: >1.03 — ABNORMAL HIGH (ref 1.005–1.030)
UROBILINOGEN UA: 0.2 mg/dL (ref 0.0–1.0)
pH: 5 (ref 5.0–8.0)

## 2014-01-25 LAB — CREATININE, URINE, RANDOM: Creatinine, Urine: 92.55 mg/dL

## 2014-01-25 LAB — LACTIC ACID, PLASMA: Lactic Acid, Venous: 1.4 mmol/L (ref 0.5–2.2)

## 2014-01-25 LAB — CBG MONITORING, ED: GLUCOSE-CAPILLARY: 83 mg/dL (ref 70–99)

## 2014-01-25 LAB — URINE MICROSCOPIC-ADD ON

## 2014-01-25 LAB — MRSA PCR SCREENING: MRSA by PCR: POSITIVE — AB

## 2014-01-25 LAB — LACTATE DEHYDROGENASE: LDH: 175 U/L (ref 94–250)

## 2014-01-25 LAB — STREP PNEUMONIAE URINARY ANTIGEN: Strep Pneumo Urinary Antigen: NEGATIVE

## 2014-01-25 LAB — PRO B NATRIURETIC PEPTIDE: PRO B NATRI PEPTIDE: 13243 pg/mL — AB (ref 0–450)

## 2014-01-25 LAB — SODIUM, URINE, RANDOM: Sodium, Ur: 28 mEq/L

## 2014-01-25 MED ORDER — SODIUM CHLORIDE 0.9 % IV SOLN: INTRAVENOUS | Status: DC

## 2014-01-25 MED ORDER — NYSTATIN 100000 UNIT/GM EX POWD
CUTANEOUS | Status: AC
  Filled 2014-01-25: qty 15

## 2014-01-25 MED ORDER — DEXTROSE 5 % IV SOLN: 2.0000 g | Freq: Once | INTRAVENOUS | Status: DC

## 2014-01-25 MED ORDER — NYSTATIN 100000 UNIT/GM EX POWD
Freq: Two times a day (BID) | CUTANEOUS | Status: DC
  Administered 2014-01-25 – 2014-01-27 (×3): via TOPICAL
  Filled 2014-01-25: qty 15

## 2014-01-25 MED ORDER — SODIUM CHLORIDE 0.9 % IV SOLN
INTRAVENOUS | Status: DC
  Administered 2014-01-25: 20:00:00 via INTRAVENOUS

## 2014-01-25 MED ORDER — MORPHINE SULFATE 2 MG/ML IJ SOLN
1.0000 mg | INTRAMUSCULAR | Status: DC | PRN
  Administered 2014-01-26 – 2014-01-27 (×2): 1 mg via INTRAVENOUS
  Filled 2014-01-25 (×2): qty 1

## 2014-01-25 MED ORDER — SODIUM CHLORIDE 0.9 % IV BOLUS (SEPSIS)
500.0000 mL | Freq: Once | INTRAVENOUS | Status: AC
  Administered 2014-01-25: 500 mL via INTRAVENOUS

## 2014-01-25 MED ORDER — PHENYLEPHRINE HCL 10 MG/ML IJ SOLN
30.0000 ug/min | INTRAVENOUS | Status: DC
  Administered 2014-01-25: 30 ug/min via INTRAVENOUS
  Administered 2014-01-25: 55 ug/min via INTRAVENOUS
  Filled 2014-01-25: qty 1

## 2014-01-25 MED ORDER — CHLORHEXIDINE GLUCONATE CLOTH 2 % EX PADS
6.0000 | MEDICATED_PAD | Freq: Every day | CUTANEOUS | Status: DC
  Administered 2014-01-26: 6 via TOPICAL

## 2014-01-25 MED ORDER — SODIUM CHLORIDE 0.9 % IV SOLN: 250.0000 mL | INTRAVENOUS | Status: DC | PRN

## 2014-01-25 MED ORDER — VANCOMYCIN HCL IN DEXTROSE 1-5 GM/200ML-% IV SOLN
1000.0000 mg | Freq: Once | INTRAVENOUS | Status: AC
  Administered 2014-01-25: 1000 mg via INTRAVENOUS
  Filled 2014-01-25: qty 200

## 2014-01-25 MED ORDER — HYDROCORTISONE 2.5 % RE CREA
1.0000 "application " | TOPICAL_CREAM | Freq: Two times a day (BID) | RECTAL | Status: DC
  Administered 2014-01-25 – 2014-01-27 (×3): 1 via RECTAL
  Filled 2014-01-25: qty 28.35

## 2014-01-25 MED ORDER — SODIUM CHLORIDE 0.9 % IJ SOLN: 3.0000 mL | Freq: Two times a day (BID) | INTRAMUSCULAR | Status: DC

## 2014-01-25 MED ORDER — PHENYLEPHRINE HCL 10 MG/ML IJ SOLN
INTRAMUSCULAR | Status: AC
  Filled 2014-01-25: qty 1

## 2014-01-25 MED ORDER — SODIUM CHLORIDE 0.9 % IV BOLUS (SEPSIS): 500.0000 mL | Freq: Once | INTRAVENOUS | Status: DC

## 2014-01-25 MED ORDER — PANTOPRAZOLE SODIUM 40 MG IV SOLR
40.0000 mg | Freq: Two times a day (BID) | INTRAVENOUS | Status: DC
  Administered 2014-01-25 – 2014-01-27 (×3): 40 mg via INTRAVENOUS
  Filled 2014-01-25 (×3): qty 40

## 2014-01-25 MED ORDER — DEXTROSE 5 % IV SOLN: 1.0000 g | Freq: Three times a day (TID) | INTRAVENOUS | Status: DC

## 2014-01-25 MED ORDER — PHENYLEPHRINE HCL 10 MG/ML IJ SOLN
30.0000 ug/min | INTRAVENOUS | Status: DC
  Administered 2014-01-25: 75 ug/min via INTRAVENOUS
  Administered 2014-01-26: 100 ug/min via INTRAVENOUS
  Administered 2014-01-26: 110 ug/min via INTRAVENOUS
  Administered 2014-01-26: 100 ug/min via INTRAVENOUS
  Filled 2014-01-25 (×4): qty 4

## 2014-01-25 MED ORDER — LORAZEPAM 2 MG/ML IJ SOLN: 0.5000 mg | INTRAMUSCULAR | Status: DC | PRN

## 2014-01-25 MED ORDER — PHENYLEPHRINE HCL 10 MG/ML IJ SOLN
INTRAMUSCULAR | Status: AC
  Filled 2014-01-25: qty 4

## 2014-01-25 MED ORDER — SODIUM CHLORIDE 0.9 % IJ SOLN: 3.0000 mL | INTRAMUSCULAR | Status: DC | PRN

## 2014-01-25 MED ORDER — MUPIROCIN 2 % EX OINT
1.0000 "application " | TOPICAL_OINTMENT | Freq: Two times a day (BID) | CUTANEOUS | Status: DC
  Administered 2014-01-25 – 2014-01-27 (×3): 1 via NASAL
  Filled 2014-01-25: qty 22

## 2014-01-25 MED ORDER — DEXTROSE 5 % IV SOLN
2.0000 g | Freq: Once | INTRAVENOUS | Status: AC
  Administered 2014-01-25: 2 g via INTRAVENOUS
  Filled 2014-01-25: qty 2

## 2014-01-25 NOTE — ED Notes (Signed)
Pt drowsy but responds to verbal stimuli.  Pt woke up and was able to state name.  571ml bolus of warm saline infusing.

## 2014-01-25 NOTE — ED Notes (Signed)
Pt comes from [facility] with c/o hypotension and SOB. Pt's husband states he thinks pt has pneumonia. Pt went to [place] to see Oncologist for a follow up visit and lab work. Pt currently lives at [facility]. According to staff at [facility] pt is not as alert as she normally is.

## 2014-01-25 NOTE — Progress Notes (Signed)
*  PRELIMINARY RESULTS* Echocardiogram 2D Echocardiogram has been performed.  [name] 02/04/2014, 4:36 PM

## 2014-01-25 NOTE — Progress Notes (Signed)
ANTIBIOTIC CONSULT NOTE - INITIAL  Pharmacy Consult for Vancomycin and Cefepime Indication: pneumonia  Allergies  Allergen Reactions  . Penicillins Rash   Patient Measurements: Height: 4\' 8"  (142.2 cm) Weight: 140 lb (63.504 kg) IBW/kg (Calculated) : 36.3  Vital Signs: Temp: 94.3 F (34.6 C) (04/14 1301) Temp src: Rectal (04/14 1301) BP: 79/61 mmHg (04/14 1410) Pulse Rate: 102 (04/14 1410) Intake/Output from previous day:   Intake/Output from this shift:    Labs:  Recent Labs  01/18/2014 0947 02/10/2014 1151  WBC 1.1* 1.0*  HGB 11.0* 10.8*  PLT 51* 48*  CREATININE 2.85* 2.83*   Estimated Creatinine Clearance: 10.4 ml/min (by C-G formula based on Cr of 2.83). No results found for this basename: VANCOTROUGH, VANCOPEAK, VANCORANDOM, GENTTROUGH, GENTPEAK, GENTRANDOM, TOBRATROUGH, TOBRAPEAK, TOBRARND, AMIKACINPEAK, AMIKACINTROU, AMIKACIN,  in the last 72 hours   Microbiology: No results found for this or any previous visit (from the past 720 hour(s)).  Medical History: Past Medical History  Diagnosis Date  . GERD (gastroesophageal reflux disease)   . Carotid stenosis     a. 92011 Carotid u/s: Right 50-69%, left <50%  . Atrial fibrillation     a. chronic coumadin;  b. 07/2011 Echo: EF 65%, triv MR, mildly dil LA.  . Tachy-brady syndrome     a. 06/2010: SJM [employer] RF DR model [id number] (serial number [id number])  . Essential hypertension, benign   . Hypothyroidism     a. Dx 09/2013  . Lupus pt's daughter states she was diagnosed with this in her 3s   . B-cell lymphoma    Medications:  Scheduled:   Assessment: 80yo female who was sent to ED after visiting [place] due to weakness and SOB.  Husband thinks pt has pneumonia.  SCr elevated on admission.  Estimated Creatinine Clearance: 10.4 ml/min (by C-G formula based on Cr of 2.83).  Goal of Therapy:  Vancomycin trough level 15-20 mcg/ml  Plan:  Vancomycin 1000mg  IV now x 1 dose Cefepime 2000mg  IV now x 1  dose F/U SCr tomorrow to assess renal fxn and order maintenance doses of abx  Clark Clowdus A Emilygrace Grothe 01/14/2014,2:30 PM

## 2014-01-25 NOTE — Progress Notes (Unsigned)
76 - [name] presents for f/u appt with Dr. Barnet Glasgow.  VS assessed; pt is hypotensive, c/o SOB with minimal exertion, rhonchi audible without stethescope.  Dr. Barnet Glasgow notified of findings - instructed to transport pt to ED for further evaluation.

## 2014-01-25 NOTE — Progress Notes (Signed)
Labs drawn today for cbc/diff,cmp,ldh,b38mic

## 2014-01-25 NOTE — ED Provider Notes (Signed)
CSN: [id number]     Arrival date & time 13-Feb-2014  1021 History  This chart was scribed for Katrina Christen, MD by Roxan Diesel, ED scribe.  This patient was seen in room APA04/APA04 and the patient's care was started at 10:39 AM.   Chief Complaint  Patient presents with  . Hypotension  . Shortness of Breath    The history is provided by the spouse and the patient. No language interpreter was used.    Level 5 Caveat: Dementia  HPI Comments: [name] is a 80 y.o. female who presents to the Emergency Department complaining of shortness of breath that began last night.  Pt is a resident of [facility].  Her husband states that last week the doctors at this facility diagnosed her with pneumonia and placed her on antibiotics.  He states she has continued to worsen and she has also developed worsening swelling in her legs which is not typical for her.  Husband states she has been diagnosed with an infection in that area.  He reports that pt also has not eaten anything in the past 3 days.  She has been drinking some fluids.  Last night she also appeared very SOB.  On arrival pt is also hypotensive at 74/80.  Husband notes that "her teeth came out" suddenly over the past week and she has been complaining of oral pain since then..  Pt has received a series of infusions for B-cell lymphoma and received her last treatment last month.     Past Medical History  Diagnosis Date  . GERD (gastroesophageal reflux disease)   . Carotid stenosis     a. 92011 Carotid u/s: Right 50-69%, left <50%  . Atrial fibrillation     a. chronic coumadin;  b. 07/2011 Echo: EF 65%, triv MR, mildly dil LA.  . Tachy-brady syndrome     a. 06/2010: SJM Accent RF DR model [id number] (serial number [id number])  . Essential hypertension, benign   . Hypothyroidism     a. Dx 09/2013  . Lupus pt's daughter states she was diagnosed with this in her 70s   . B-cell lymphoma     Past Surgical History  Procedure  Laterality Date  . Total knee arthroplasty      Right  . Laparoscopic cholecystectomy    . Pacemaker insertion  06/29/10    SJM implanted by Dr Rayann Heman for tachy/brady syndrome    Family History  Problem Relation Age of Onset  . Other      no premature CAD.    History  Substance Use Topics  . Smoking status: Never Smoker   . Smokeless tobacco: Never Used  . Alcohol Use: No    OB History   Grav Para Term Preterm Abortions TAB SAB Ect Mult Living                   Review of Systems  Unable to perform ROS: Dementia       Allergies  Penicillins  Home Medications   Prior to Admission medications   Medication Sig Start Date End Date Taking? Authorizing Provider  acetaminophen (TYLENOL) 325 MG tablet Take 650 mg by mouth once. Take before chemotherapy    Historical Provider, MD  benazepril (LOTENSIN) 20 MG tablet Take 20 mg by mouth daily.    Historical Provider, MD  carvedilol (COREG) 6.25 MG tablet Take 12.5 mg by mouth 2 (two) times daily with a meal.     Historical Provider, MD  Cholecalciferol (VITAMIN D3) 1000 UNITS tablet Take 1,000 Units by mouth daily.      Historical Provider, MD  clotrimazole-betamethasone (LOTRISONE) cream Apply 1 application topically 2 (two) times daily.  07/08/13   Historical Provider, MD  diphenhydrAMINE (BENADRYL) 50 MG tablet Take 50 mg by mouth once. Take before chemotherapy    Historical Provider, MD  furosemide (LASIX) 20 MG tablet Take 60 mg by mouth daily.     Historical Provider, MD  hydrocortisone (ANUSOL-HC) 2.5 % rectal cream Place 1 application rectally 3 (three) times daily.    Historical Provider, MD  isosorbide mononitrate (IMDUR) 30 MG 24 hr tablet Take 30 mg by mouth daily.    Historical Provider, MD  omeprazole (PRILOSEC OTC) 20 MG tablet Take 20 mg by mouth daily.    Historical Provider, MD   BP 74/50  Pulse 99  Resp 22  Ht 4\' 8"  (1.422 m)  Wt 140 lb (63.504 kg)  BMI 31.41 kg/m2  SpO2 94%  Physical Exam  Nursing  note and vitals reviewed. Constitutional: She appears well-developed and well-nourished.  Answers questions reasonably appropriately  HENT:  Head: Normocephalic and atraumatic.  Mostly edentulous, remaining teeth full of caries  Eyes: Conjunctivae and EOM are normal. Pupils are equal, round, and reactive to light.  Neck: Normal range of motion. Neck supple.  Cardiovascular: Normal rate, regular rhythm and normal heart sounds.   Pulmonary/Chest: Effort normal and breath sounds normal. No respiratory distress. She has no wheezes. She has no rales.  Appears slightly dyspneic  Abdominal: Soft. Bowel sounds are normal.  Musculoskeletal: Normal range of motion.  Neurological: She is alert.  Skin: Skin is warm and dry.    ED Course  Procedures (including critical care time)  DIAGNOSTIC STUDIES: Oxygen Saturation is 94% on room air, adequate by my interpretation.    COORDINATION OF CARE: 10:49 AM-Discussed treatment plan which includes CXR and labs with pt's husband at bedside and he agreed to plan.     Labs Review Labs Reviewed  CBC WITH DIFFERENTIAL - Abnormal; Notable for the following:    WBC 1.0 (*)    RBC 3.51 (*)    Hemoglobin 10.8 (*)    HCT 33.4 (*)    Platelets 48 (*)    Neutro Abs 0.5 (*)    Lymphs Abs 0.3 (*)    Monocytes Relative 29 (*)    All other components within normal limits  BASIC METABOLIC PANEL - Abnormal; Notable for the following:    BUN 98 (*)    Creatinine, Ser 2.83 (*)    GFR calc non Af Amer 14 (*)    GFR calc Af Amer 16 (*)    All other components within normal limits  URINALYSIS, ROUTINE W REFLEX MICROSCOPIC - Abnormal; Notable for the following:    Specific Gravity, Urine >1.030 (*)    Protein, ur 100 (*)    Leukocytes, UA TRACE (*)    All other components within normal limits  URINE MICROSCOPIC-ADD ON - Abnormal; Notable for the following:    Squamous Epithelial / LPF MANY (*)    Bacteria, UA MANY (*)    All other components within normal  limits  CULTURE, BLOOD (ROUTINE X 2)  CULTURE, BLOOD (ROUTINE X 2)  LACTIC ACID, PLASMA  CBG MONITORING, ED    Imaging Review Dg Chest Port 1 View  2014-01-30   CLINICAL DATA:  Shortness of breath  EXAM: PORTABLE CHEST - 1 VIEW  COMPARISON:  12/02/2013  FINDINGS: Cardiac shadow  is enlarged. A pacing device is again identified. Increasing right effusion and right basilar infiltrate is noted. A small left-sided pleural effusion is noted.  IMPRESSION: Increasing right basilar changes.  New left effusion.   Electronically Signed   By: Inez Catalina M.D.   On: 02/12/14 12:31     EKG Interpretation None     CRITICAL CARE Performed by: [name]  ?  Total critical care time: 40  Critical care time was exclusive of separately billable procedures and treating other patients.  Critical care was necessary to treat or prevent imminent or life-threatening deterioration.  Critical care was time spent personally by me on the following activities: development of treatment plan with patient and/or surrogate as well as nursing, discussions with consultants, evaluation of patient's response to treatment, examination of patient, obtaining history from patient or surrogate, ordering and performing treatments and interventions, ordering and review of laboratory studies, ordering and review of radiographic studies, pulse oximetry and re-evaluation of patient's condition.  MDM   Final diagnoses:  Healthcare-associated pneumonia    The patient is very sick. She is being treated for a B-cell lymphoma with chemotherapy and has a healthcare associated pneumonia, is hypothermic and hypotensive. Bear hugger and IV fluids initiated Family does not want heroic measures taken.  IV vancomycin. IV Maxipime.  Several discussions with husband and clergy member regarding her care     Katrina Christen, MD 02-12-14 1506

## 2014-01-25 NOTE — H&P (Addendum)
[facility] History and Physical  [name] JIR:[id number] DOB: 09-11-1924 DOA: 01/14/2014  Referring physician:  [name] PCP:  RAMA,CHRISTINA, MD   Chief Complaint:  Shortness of breath  HPI:  The patient is a 80 y.o. year-old female with history of atrial fibrillation on chronic anticoagulation, hypertension, B-cell splenic marginal zone lymphoma and pancytopenia who has been receiving rituximab for the last 4 weeks, congestive heart failure with EF of 35-40%, GERD, hypothyroidism, possible lupus, who presents with shortness of breath, cough, not eating well, and low blood pressure.  The patient was last at their baseline health approximately 2 weeks ago.  About one week ago, she developed some cough and increased lower extremity edema. She was told that she had heart failure and possibly pneumonia. She was given a prescription for furosemide and levofloxacin. Since starting these medications, she has had a quick decline. She became intermittently confused, had poor appetite. Her cough persisted and she became Aoi Kouns of breath. Today, she presented to the clinic for routine labs prior to rituximab infusion and she was noted to be hypotensive. She was rhonchorous and Cheryle Dark of breath. She was referred to the emergency department. Her husband states that she has not had any reported fevers. She may have been having some diarrhea.  Also, incidentally, she has recently lost most of her teeth.  In the emergency room [place], temperature 90.3, pulse in the 110s, blood pressure 70s over 50s, breathing in the mid 20 with 99% oxygen saturation on 2 L nasal cannula. Labs were notable for white blood cell count of 1, ANC of 500, hemoglobin 10.8, platelets 48, BUN 98, creatinine 2.83, up from baseline of 1.3. Liver function tests were generally within normal limits except for low albumin.  She was given 2 L IV fluids and given vancomycin and cefepime. Her goals of care were clarified, and the family  would like antibiotics, IV fluids, and medications to support blood pressure possible, but they do not want any invasive procedures or painful tests. She is DO NOT RESUSCITATE/DO NOT INTUBATE.  CXR demonstrates bilateral pleural effusions and right basilar infiltrate.  UA negative.    Review of Systems:  General:  Denies fevers, chills, weight loss or gain HEENT:  Denies changes to hearing and vision, rhinorrhea, sinus congestion.  + sore mouth from lost teeth CV:  Denies chest pain and palpitations, + lower extremity edema.  PULM:  + SOB, wheezing, cough.   GI:  Denies nausea, vomiting, constipation, diarrhea.   GU:  Denies dysuria, frequency, urgency ENDO:  Denies polyuria, polydipsia.   HEME:  Denies hematemesis, blood in stools, melena, abnormal bruising or bleeding.  LYMPH:  Denies lymphadenopathy.   MSK:  Denies arthralgias, myalgias.   DERM:  Denies skin rash or ulcer.   NEURO:  Denies focal numbness, weakness, slurred speech, facial droop.  PSYCH:  Denies anxiety and depression.  Mild confusion during last week.    Past Medical History  Diagnosis Date  . GERD (gastroesophageal reflux disease)   . Carotid stenosis     a. 92011 Carotid u/s: Right 50-69%, left <50%  . Atrial fibrillation     a. chronic coumadin;  b. 07/2011 Echo: EF 65%, triv MR, mildly dil LA.  . Tachy-brady syndrome     a. 06/2010: SJM Accent RF DR model BPZ025 (serial number [id number])  . Essential hypertension, benign   . Hypothyroidism     a. Dx 09/2013  . Lupus pt's daughter states she was diagnosed with this in  her 46s   . B-cell lymphoma   . Heart failure     EF 35-40%   Past Surgical History  Procedure Laterality Date  . Total knee arthroplasty      Right  . Laparoscopic cholecystectomy    . Pacemaker insertion  06/29/10    SJM implanted by Dr Rayann Heman for tachy/brady syndrome   Social History:  reports that she has never smoked. She has never used smokeless tobacco. She reports that she does not  drink alcohol or use illicit drugs.  Assisted living facility [facility].  Allergies  Allergen Reactions  . Penicillins Rash    Family History  Problem Relation Age of Onset  . Other      no premature CAD.  Marland Kitchen Heart failure Brother   . Heart failure Sister   . Cancer Neg Hx   . Lupus Neg Hx      Prior to Admission medications   Medication Sig Start Date End Date Taking? Authorizing Provider  benazepril (LOTENSIN) 20 MG tablet Take 20 mg by mouth daily.   Yes Historical Provider, MD  carvedilol (COREG) 12.5 MG tablet Take 12.5 mg by mouth 2 (two) times daily with a meal.   Yes Historical Provider, MD  Cholecalciferol (VITAMIN D3) 1000 UNITS tablet Take 1,000 Units by mouth daily.     Yes Historical Provider, MD  furosemide (LASIX) 20 MG tablet Take 60 mg by mouth daily.    Yes Historical Provider, MD  hydrALAZINE (APRESOLINE) 25 MG tablet Take 25 mg by mouth 3 (three) times daily. 01/18/14  Yes Historical Provider, MD  hydrocortisone (ANUSOL-HC) 2.5 % rectal cream Place 1 application rectally 2 (two) times daily.    Yes Historical Provider, MD  isosorbide mononitrate (IMDUR) 30 MG 24 hr tablet Take 30 mg by mouth daily.   Yes Historical Provider, MD  levofloxacin (LEVAQUIN) 250 MG tablet Take 250 mg by mouth daily. Take for 7 days 01/18/14  Yes Historical Provider, MD  loperamide (IMODIUM) 2 MG capsule Take 2 mg by mouth as needed for diarrhea or loose stools.   Yes Historical Provider, MD  omeprazole (PRILOSEC OTC) 20 MG tablet Take 20 mg by mouth daily.   Yes Historical Provider, MD  acetaminophen (TYLENOL) 325 MG tablet Take 650 mg by mouth once. Take before chemotherapy    Historical Provider, MD   Physical Exam: Filed Vitals:   02-09-2014 1645 02-09-14 1715 02/09/14 1723 February 09, 2014 1730  BP:  94/71 97/71   Pulse:  103 97 109  Temp:   95.9 F (35.5 C)   TempSrc:   Rectal   Resp: 24 22  27   Height:   4\' 11"  (1.499 m)   Weight:   74.9 kg (165 lb 2 oz)   SpO2:  98% 98%  97%     General:  CF, confused and lethargic, mild respiratory distress with SCM retractions  Eyes:  PERRL, anicteric, non-injected.  ENT:  Nares clear.  OP clear, poor dentition, non-erythematous without plaques or exudates.  MMM.  Neck:  Supple without TM or JVD.    Lymph:  No cervical, supraclavicular, or submandibular LAD.  Cardiovascular:  Tachycardic, normal S1, S2, with S3 gallop.  2+ pulses, warm extremities  Respiratory:  Diminished bilateral BS with rhonchi anteriorly, no rales or wheezes  Abdomen:  NABS.  Soft, ND/NT.    Skin:  No rashes or focal lesions.  Musculoskeletal:  Normal bulk and tone.  2+ LE edema.  Psychiatric:  A & O  to person and place.   Appropriate affect.  Neurologic:  Grossly moves all extremities  Labs on Admission:  Basic Metabolic Panel:  Recent Labs Lab 01/15/2014 0947 01/18/2014 1151  NA 137 138  K 4.9 5.0  CL 97 99  CO2 28 28  GLUCOSE 101* 92  BUN 97* 98*  CREATININE 2.85* 2.83*  CALCIUM 8.8 8.6   Liver Function Tests:  Recent Labs Lab 01/19/2014 0947  AST 14  ALT 10  ALKPHOS 67  BILITOT 0.5  PROT 6.3  ALBUMIN 2.8*   No results found for this basename: LIPASE, AMYLASE,  in the last 168 hours No results found for this basename: AMMONIA,  in the last 168 hours CBC:  Recent Labs Lab 02/07/2014 0947 01/23/2014 1151  WBC 1.1* 1.0*  NEUTROABS 0.5* 0.5*  HGB 11.0* 10.8*  HCT 33.9* 33.4*  MCV 95.5 95.2  PLT 51* 48*   Cardiac Enzymes: No results found for this basename: CKTOTAL, CKMB, CKMBINDEX, TROPONINI,  in the last 168 hours  BNP (last 3 results)  Recent Labs  01/20/2014 1142  PROBNP 13243.0*   CBG:  Recent Labs Lab 02/01/2014 1135  GLUCAP 83    Radiological Exams on Admission: Dg Chest Port 1 View  01/22/2014   CLINICAL DATA:  Shortness of breath  EXAM: PORTABLE CHEST - 1 VIEW  COMPARISON:  12/02/2013  FINDINGS: Cardiac shadow is enlarged. A pacing device is again identified. Increasing right effusion and  right basilar infiltrate is noted. A small left-sided pleural effusion is noted.  IMPRESSION: Increasing right basilar changes.  New left effusion.   Electronically Signed   By: Inez Catalina M.D.   On: 02/07/2014 12:31    EKG: pending  Assessment/Plan Principal Problem:   Septic shock Active Problems:   FIBRILLATION, ATRIAL   BRADYCARDIA-TACHYCARDIA SYNDROME   Essential hypertension, benign   Hypothyroidism   AKI (acute kidney injury)   Other dysphagia   Thrombocytopenia   Splenic marginal zone b-cell lymphoma   Healthcare-associated pneumonia   Pancytopenia  ---  Refractory septic shock due to healthcare associated pneumonia in neutropenic patient.  Fair to poor prognosis -  Followup blood cultures -  Continue vancomycin and cefepime -  Strep pneumo and Legionella antigen -  Sputum culture if able -  No central line -  Stop IV fluids as she appears to be hypervolemic -  Hold blood pressure medications -  Cycle troponins -  Start phenylephrine -  Consider trial of dopamine >> may be okay since she tolerated hydralazine  -  Warming blanket until temperature wnl  Acute on chronic systolic heart failure with narrow pulse pressure -  Hold diuretics given hypertension -  Consider trial of dopamine -  If BP improves with phenylephrine, consider lasix gtt   Atrial fibrillation, tachycardic.  Not on a/c due to thrombocytopenia  -  Check INR -  Hold blood pressure medications including beta blocker  Tachybradycardia syndrome status post pacemaker  B cell lymphoma, previously receiving chemotherapy with rituximab -  Please notify oncology patient admitted  GERD, stable. Continue PPI  Pancytopenia due to malignancy  -  Avoid warfarin/heparin since platelets < 50K -  tx for hgb < 7  Acute on chronic kidney injury, may be due to dehydration or heart failure -  FENa -  RUS -  Minimize nephrotoxins -  Renally dose medications -  Repeat BMP in AM  Diet:  Dysphagia 2 for  comfort Access:  PIV x  IVF:  off Proph:  Check INR, SCDs  Code Status: DNR/DNI Family Communication: patient and her husband Disposition Plan: Admit to ICU  Time spent: 60 min [facility] Pager [phone]  If 7PM-7AM, please contact night-coverage [email or web address] Password [facility] 01/22/2014, 5:39 PM

## 2014-01-25 NOTE — ED Notes (Signed)
Pt's rectal temp was 93.0. Dr Lacinda Axon notified and verbally ordered bear-hugger to be applied. Pt was also given warm NS.

## 2014-01-25 NOTE — ED Notes (Signed)
Lab called with critical WBC value of 1.0. Dr Lacinda Axon notified.

## 2014-01-25 NOTE — ED Notes (Signed)
CRITICAL VALUE ALERT  Critical value received:  WBC 1.0  Date of notification:  01/13/2014  Time of notification:  12:07  Critical value read back: yes  Nurse who received alert:  Rip Harbour, RN   MD notified (1st page):  Dr Lacinda Axon   Time of first page: 12:07  MD notified (2nd page):  Time of second page:  Responding MD:  Dr Lacinda Axon   Time MD responded:  12:07

## 2014-01-25 NOTE — ED Notes (Signed)
Pt's bp reported to Dr. Lacinda Axon.  Also hr fluctuating from 90's to 120s.  EDP aware.  Instructed to administer anotehr 562ml bolus of warm saline.

## 2014-01-26 ENCOUNTER — Inpatient Hospital Stay (HOSPITAL_COMMUNITY): Payer: Medicare HMO

## 2014-01-26 ENCOUNTER — Telehealth (HOSPITAL_COMMUNITY): Payer: Self-pay | Admitting: Hematology and Oncology

## 2014-01-26 ENCOUNTER — Encounter (HOSPITAL_COMMUNITY): Payer: Medicare HMO

## 2014-01-26 DIAGNOSIS — A419 Sepsis, unspecified organism: Secondary | ICD-10-CM | POA: Diagnosis not present

## 2014-01-26 DIAGNOSIS — I4891 Unspecified atrial fibrillation: Secondary | ICD-10-CM

## 2014-01-26 DIAGNOSIS — N179 Acute kidney failure, unspecified: Secondary | ICD-10-CM

## 2014-01-26 DIAGNOSIS — R0602 Shortness of breath: Secondary | ICD-10-CM | POA: Diagnosis not present

## 2014-01-26 DIAGNOSIS — N189 Chronic kidney disease, unspecified: Secondary | ICD-10-CM

## 2014-01-26 LAB — CBC
HEMATOCRIT: 36.7 % (ref 36.0–46.0)
Hemoglobin: 12 g/dL (ref 12.0–15.0)
MCH: 30.5 pg (ref 26.0–34.0)
MCHC: 32.7 g/dL (ref 30.0–36.0)
MCV: 93.4 fL (ref 78.0–100.0)
PLATELETS: 93 10*3/uL — AB (ref 150–400)
RBC: 3.93 MIL/uL (ref 3.87–5.11)
RDW: 14.9 % (ref 11.5–15.5)
WBC: 2.6 10*3/uL — AB (ref 4.0–10.5)

## 2014-01-26 LAB — BASIC METABOLIC PANEL
BUN: 94 mg/dL — ABNORMAL HIGH (ref 6–23)
CALCIUM: 8.4 mg/dL (ref 8.4–10.5)
CHLORIDE: 99 meq/L (ref 96–112)
CO2: 24 meq/L (ref 19–32)
Creatinine, Ser: 2.82 mg/dL — ABNORMAL HIGH (ref 0.50–1.10)
GFR calc Af Amer: 16 mL/min — ABNORMAL LOW (ref >90)
GFR calc non Af Amer: 14 mL/min — ABNORMAL LOW (ref >90)
Glucose, Bld: 98 mg/dL (ref 70–99)
Potassium: 4.8 mEq/L (ref 3.7–5.3)
SODIUM: 137 meq/L (ref 137–147)

## 2014-01-26 LAB — TROPONIN I: Troponin I: <0.3 ng/mL (ref <0.30)

## 2014-01-26 MED ORDER — SODIUM CHLORIDE 0.9 % IJ SOLN: 10.0000 mL | INTRAMUSCULAR | Status: DC | PRN

## 2014-01-26 MED ORDER — HYDROCORTISONE NA SUCCINATE PF 100 MG IJ SOLR
50.0000 mg | Freq: Three times a day (TID) | INTRAMUSCULAR | Status: DC
  Administered 2014-01-26: 50 mg via INTRAVENOUS
  Filled 2014-01-26 (×2): qty 2

## 2014-01-26 MED ORDER — FUROSEMIDE 10 MG/ML IJ SOLN
20.0000 mg | Freq: Once | INTRAMUSCULAR | Status: AC
  Administered 2014-01-26: 20 mg via INTRAVENOUS
  Filled 2014-01-26: qty 2

## 2014-01-26 MED ORDER — ONDANSETRON HCL 4 MG/2ML IJ SOLN
4.0000 mg | Freq: Four times a day (QID) | INTRAMUSCULAR | Status: DC | PRN
  Administered 2014-01-26: 4 mg via INTRAVENOUS
  Filled 2014-01-26: qty 2

## 2014-01-26 MED ORDER — BIOTENE DRY MOUTH MT LIQD
15.0000 mL | Freq: Two times a day (BID) | OROMUCOSAL | Status: DC
  Administered 2014-01-26: 15 mL via OROMUCOSAL

## 2014-01-26 MED ORDER — VANCOMYCIN HCL IN DEXTROSE 1-5 GM/200ML-% IV SOLN
1000.0000 mg | INTRAVENOUS | Status: DC
  Filled 2014-01-26: qty 200

## 2014-01-26 MED ORDER — SODIUM CHLORIDE 0.9 % IJ SOLN
10.0000 mL | Freq: Two times a day (BID) | INTRAMUSCULAR | Status: DC
  Administered 2014-01-27: 10 mL

## 2014-01-26 MED ORDER — CHLORHEXIDINE GLUCONATE 0.12 % MT SOLN
15.0000 mL | Freq: Two times a day (BID) | OROMUCOSAL | Status: DC
  Administered 2014-01-26 (×2): 15 mL via OROMUCOSAL
  Filled 2014-01-26 (×2): qty 15

## 2014-01-26 MED ORDER — DEXTROSE 5 % IV SOLN
1.0000 g | INTRAVENOUS | Status: DC
  Administered 2014-01-26: 1 g via INTRAVENOUS
  Filled 2014-01-26: qty 1

## 2014-01-26 NOTE — Care Management Note (Signed)
    Page 1 of 1   01/26/2014     11:15:10 AM   CARE MANAGEMENT NOTE 01/26/2014  Patient:  [name], [name]   Account Number:  [id number]  Date Initiated:  01/26/2014  Documentation initiated by:  Theophilus Kinds  Subjective/Objective Assessment:   Pt admitted from [facility]. Pt will return to facility at discharge.     Action/Plan:   CSW to arrange discharge to facility when medically stable.   Anticipated DC Date:  01/31/2014   Anticipated DC Plan:  ASSISTED LIVING / REST HOME  In-house referral  Clinical Social Worker      DC Planning Services  CM consult      Choice offered to / List presented to:             Status of service:  Completed, signed off Medicare Important Message given?   (If response is "NO", the following Medicare IM given date fields will be blank) Date Medicare IM given:   Date Additional Medicare IM given:    Discharge Disposition:  ASSISTED LIVING  Per UR Regulation:    If discussed at Long Length of Stay Meetings, dates discussed:    Comments:  01/26/14 [place], RN BSN CM

## 2014-01-26 NOTE — Progress Notes (Signed)
PT REFUSED HER LUNCH, BUT DID EAT 1 POTATO CHIP AND DRANK 2 SIPS OF CRANBERRY JUICE.Marland Kitchen

## 2014-01-26 NOTE — Progress Notes (Signed)
PROGRESS NOTE  [name] AVW:[id number] DOB: Jul 04, 1924 DOA: 2014-02-06 PCP: RAMA,CHRISTINA, MD  Assessment/Plan: Sepsis -Continue vancomycin and cefepime empirically -spoke with Dr. Jenkins-->recommended PICC which has been ordered -Continue phenylephrine -Present at the time of admission -Secondary to HCAP -Start stress steroids HCAP -Continue antibiotics as discussed above Acute on chronic systolic CHF -Diuresis is limited at this time due to hypotension -Continue phenylephrine Atrial fibrillation -Heart rate 100-110 -Hypertension limits chronotropic agent Tachybradycardia syndrome -Status post PPM Neutropenia/B-cell lymphoma -Previously receiving chemotherapy with rituximab -Transfuse for hemoglobin <7 Acute on chronic renal failure (CKD stage III-IV) -Renal ultrasound  -Likely due to ATN and hypovolemia Goals of care  -Patient is DO NOT RESUSCITATE/DNI -Family wishes to continue aggressive care for at least the next 24 hours    Family Communication:   family at beside updated Disposition Plan:   ICU  Antibiotics:  Vanco 4/14/>>>  Cefepime 4/14>>>    Procedures/Studies: Dg Chest Port 1 View  02/06/14   CLINICAL DATA:  Shortness of breath  EXAM: PORTABLE CHEST - 1 VIEW  COMPARISON:  12/02/2013  FINDINGS: Cardiac shadow is enlarged. A pacing device is again identified. Increasing right effusion and right basilar infiltrate is noted. A small left-sided pleural effusion is noted.  IMPRESSION: Increasing right basilar changes.  New left effusion.   Electronically Signed   By: Inez Catalina M.D.   On: 2014-02-06 12:31         Subjective: Patient is somnolent but awakens easily. Denies any chest pain, shortness breath, vomiting, abdominal pain, headache. Denies any fevers or chills the   Objective: Filed Vitals:   01/26/14 0715 01/26/14 0730 01/26/14 0733 01/26/14 0745  BP: 86/46 93/76  139/68  Pulse: 101 96  100  Temp:   97.8 F (36.6 C)     TempSrc:   Rectal   Resp: 25 27  25   Height:      Weight:      SpO2: 95% 95%  96%    Intake/Output Summary (Last 24 hours) at 01/26/14 0959 Last data filed at 01/26/14 0600  Gross per 24 hour  Intake 416.17 ml  Output    350 ml  Net  66.17 ml   Weight change:  Exam:   General:  Pt is alert, follows commands appropriately, not in acute distress  HEENT: No icterus, No thrush,  [place]/AT  Cardiovascular: IRRR, S1/S2, no rubs,  Respiratory:Bilateral crackles. No wheezing. Good air movement.  Abdomen: Soft/+BS, non tender, non distended, no guarding  Extremities: 2+ LE edema, No lymphangitis, No petechiae, No rashes, no synovitis  Data Reviewed: Basic Metabolic Panel:  Recent Labs Lab 02-06-2014 0947 February 06, 2014 1151 01/26/14 0514  NA 137 138 137  K 4.9 5.0 4.8  CL 97 99 99  CO2 28 28 24   GLUCOSE 101* 92 98  BUN 97* 98* 94*  CREATININE 2.85* 2.83* 2.82*  CALCIUM 8.8 8.6 8.4   Liver Function Tests:  Recent Labs Lab 2014/02/06 0947  AST 14  ALT 10  ALKPHOS 67  BILITOT 0.5  PROT 6.3  ALBUMIN 2.8*   No results found for this basename: LIPASE, AMYLASE,  in the last 168 hours No results found for this basename: AMMONIA,  in the last 168 hours CBC:  Recent Labs Lab 02/06/14 0947 02/06/2014 1151 01/26/14 0514  WBC 1.1* 1.0* 2.6*  NEUTROABS 0.5* 0.5*  --   HGB 11.0* 10.8* 12.0  HCT 33.9* 33.4* 36.7  MCV 95.5 95.2  93.4  PLT 51* 48* 93*   Cardiac Enzymes:  Recent Labs Lab 01/13/2014 1726 01/23/2014 2332 01/26/14 0514  TROPONINI <0.30 <0.30 <0.30   BNP: No components found with this basename: POCBNP,  CBG:  Recent Labs Lab 01/28/2014 1135  GLUCAP 83    Recent Results (from the past 240 hour(s))  MRSA PCR SCREENING     Status: Abnormal   Collection Time    01/26/2014  5:00 PM      Result Value Ref Range Status   MRSA by PCR POSITIVE (*) NEGATIVE Final   Comment:            The GeneXpert MRSA Assay (FDA     approved for NASAL specimens     only), is  one component of a     comprehensive MRSA colonization     surveillance program. It is not     intended to diagnose MRSA     infection nor to guide or     monitor treatment for     MRSA infections.     RESULT CALLED TO, READ BACK BY AND VERIFIED WITH:     KOGER L AT 2022 ON [id number] BY FORSYTH K     Scheduled Meds: . antiseptic oral rinse  15 mL Mouth Rinse q12n4p  . chlorhexidine  15 mL Mouth Rinse BID  . Chlorhexidine Gluconate Cloth  6 each Topical Q0600  . hydrocortisone  1 application Rectal BID  . mupirocin ointment  1 application Nasal BID  . nystatin   Topical BID  . pantoprazole (PROTONIX) IV  40 mg Intravenous Q12H   Continuous Infusions: . sodium chloride 10 mL/hr at 01/26/14 0600  . phenylephrine (NEO-SYNEPHRINE) Adult infusion 80 mcg/min (01/26/14 0700)     Orson Eva, DO  [facility] Pager [phone]  If 7PM-7AM, please contact night-coverage [email or web address] Password [email or web address] 01/26/2014, 9:59 AM   LOS: 1 day

## 2014-01-26 NOTE — Progress Notes (Signed)
ANTIBIOTIC CONSULT NOTE - INITIAL  Pharmacy Consult for Vancomycin and Cefepime Indication: pneumonia  Allergies  Allergen Reactions  . Penicillins Rash   Patient Measurements: Height: 4\' 11"  (149.9 cm) Weight: 165 lb 2 oz (74.9 kg) IBW/kg (Calculated) : 43.2  Vital Signs: Temp: 97.8 F (36.6 C) (04/15 0733) Temp src: Rectal (04/15 0733) BP: 139/68 mmHg (04/15 0745) Pulse Rate: 100 (04/15 0745) Intake/Output from previous day: 04/14 0701 - 04/15 0700 In: 416.2 [I.V.:416.2] Out: 350 [Urine:350] Intake/Output from this shift:    Labs:  Recent Labs  01/24/2014 0947 01/21/2014 1151 01/24/2014 2130 01/26/14 0514  WBC 1.1* 1.0*  --  2.6*  HGB 11.0* 10.8*  --  12.0  PLT 51* 48*  --  93*  LABCREA  --   --  92.55  --   CREATININE 2.85* 2.83*  --  2.82*   Estimated Creatinine Clearance: 12.4 ml/min (by C-G formula based on Cr of 2.82). No results found for this basename: VANCOTROUGH, Corlis Leak, VANCORANDOM, GENTTROUGH, GENTPEAK, GENTRANDOM, TOBRATROUGH, TOBRAPEAK, TOBRARND, AMIKACINPEAK, AMIKACINTROU, AMIKACIN,  in the last 72 hours   Microbiology: Recent Results (from the past 720 hour(s))  CULTURE, BLOOD (ROUTINE X 2)     Status: None   Collection Time    01/29/2014 11:42 AM      Result Value Ref Range Status   Specimen Description BLOOD LEFT HAND   Final   Special Requests     Final   Value: BOTTLES DRAWN AEROBIC AND ANAEROBIC 5CC  IMMUNE:COMPROMISED   Culture NO GROWTH 1 DAY   Final   Report Status PENDING   Incomplete  CULTURE, BLOOD (ROUTINE X 2)     Status: None   Collection Time    01/13/2014 11:45 AM      Result Value Ref Range Status   Specimen Description BLOOD LEFT ANTECUBITAL   Final   Special Requests     Final   Value: BOTTLES DRAWN AEROBIC AND ANAEROBIC 6CC IMMUNE:COMPROMISED   Culture NO GROWTH 1 DAY   Final   Report Status PENDING   Incomplete  MRSA PCR SCREENING     Status: Abnormal   Collection Time    02/01/2014  5:00 PM      Result Value Ref Range  Status   MRSA by PCR POSITIVE (*) NEGATIVE Final   Comment:            The GeneXpert MRSA Assay (FDA     approved for NASAL specimens     only), is one component of a     comprehensive MRSA colonization     surveillance program. It is not     intended to diagnose MRSA     infection nor to guide or     monitor treatment for     MRSA infections.     RESULT CALLED TO, READ BACK BY AND VERIFIED WITH:     KOGER L AT 2022 ON [id number] BY FORSYTH K    Medical History: Past Medical History  Diagnosis Date  . GERD (gastroesophageal reflux disease)   . Carotid stenosis     a. 92011 Carotid u/s: Right 50-69%, left <50%  . Atrial fibrillation     a. chronic coumadin;  b. 07/2011 Echo: EF 65%, triv MR, mildly dil LA.  . Tachy-brady syndrome     a. 06/2010: [facility] [employer] RF DR model [id number] (serial number [id number])  . Essential hypertension, benign   . Hypothyroidism     a. Dx 09/2013  . Lupus pt's  daughter states she was diagnosed with this in her 13s   . B-cell lymphoma   . Heart failure     EF 35-40%   Medications:  Scheduled:  . antiseptic oral rinse  15 mL Mouth Rinse q12n4p  . chlorhexidine  15 mL Mouth Rinse BID  . Chlorhexidine Gluconate Cloth  6 each Topical Q0600  . hydrocortisone  1 application Rectal BID  . hydrocortisone sod succinate (SOLU-CORTEF) inj  50 mg Intravenous Q8H  . mupirocin ointment  1 application Nasal BID  . nystatin   Topical BID  . pantoprazole (PROTONIX) IV  40 mg Intravenous Q12H   Assessment: 80yo F who admitted with PNA, sepsis and started on empiric, broad-spectrum antibiotics.  She has chronic renal insufficiency.    Vancomycin 4/14>> Cefepime 4/14>>  Goal of Therapy:  Vancomycin trough level 15-20 mcg/ml  Plan:  Cefepime 1gm IV q24h Vancomycin 1gm IV q48h Check Vancomycin trough at steady state Monitor renal function and cx data   Lavonia Drafts Yusuf Yu 01/26/2014,12:32 PM

## 2014-01-26 NOTE — Clinical Social Work Psychosocial (Signed)
    Clinical Social Work Department BRIEF PSYCHOSOCIAL ASSESSMENT 01/26/2014  Patient:  [name], [name]     Account Number:  [id number]     Admit date:  02/04/2014  Clinical Social Worker:  Edwyna Shell, [place]  Date/Time:  01/26/2014 09:00 AM  Referred by:  CSW  Date Referred:  01/26/2014 Referred for  ALF Placement   Other Referral:   Interview type:  Patient Other interview type:   Also spoke w Shirlean Mylar, administrator at [place] Status:  [place] Admitted from facility:  Other Level of care:  Assisted Living Primary support name:  Lamoine Fredricksen Primary support relationship to patient:  SPOUSE Degree of support available:   Recently admitted to [facility], has supportive husband who visits daily.    CURRENT CONCERNS Current Concerns  Post-Acute Placement   Other Concerns:    SOCIAL WORK ASSESSMENT / PLAN Patient oriented to person and situation, alert and critically ill.  CSW deferred direct assessment of patient due to her need for RN to proceed w patient care needs. Spoke w daughter and husband in hallway.  Husband tearful, daughter appears resigned to patient's current critical illness.  Have been told that "next 24 hours are critical, we just want her to be comfortable and not suffer needlessly."  Patient has been undergoing cancer tx. Recently moved to [place].  This has been hard on couple - patient and husband have been married 19 years. Daughter is only child.  Family is supportive of patient and aware that she is critically ill.  Would like patient to go back to [facility] at discharge; however, patient needs are unclear at moment.    CSW spoke w Shirlean Mylar, Scientist, physiological at [employer].  Says patient has been w them approx one month, is completing course of chemotherapy for leukemia at [facility]. Has been very weak, especially on days around chemo treatments.  Says patient has difficulty w swelling feet -  HH RN at [facility] and patient has UNA boot placed.  Had trouble w cellulitis in left foot, last week foot was red and warm to touch, MD at facility began antibiotics last Tuesday.    Facility assist w dressing, bathing and toileting.  Patient does not ambulate due to weakness, is primarily in a wheelchair at ALF.  This is not her baseline, but apparently is due to weakness secondary to chemo treatment. Patient has had difficulty w adjustment to ALF, misses independence and home environment, goes "up and down." Husband is very supportive and visits daily.    CSW will continue to follow and support family and patient.   Assessment/plan status:  Psychosocial Support/Ongoing Assessment of Needs Other assessment/ plan:   Information/referral to community resources:   None needed at this time.    PATIENT'S/FAMILY'S RESPONSE TO PLAN OF CARE: Family expressed thanks for CSW concern for patient and family.  Worried about patient, dont want her to "suffer." Want to "see what happens in next 24 hours."    Edwyna Shell, [place] Worker [phone])

## 2014-01-26 NOTE — Progress Notes (Signed)
UR chart review completed.  

## 2014-01-27 DIAGNOSIS — D61818 Other pancytopenia: Secondary | ICD-10-CM

## 2014-01-27 LAB — BETA 2 MICROGLOBULIN, SERUM: Beta-2 Microglobulin: 21.1 mg/L — ABNORMAL HIGH (ref <=2.51)

## 2014-01-27 LAB — LEGIONELLA ANTIGEN, URINE: Legionella Antigen, Urine: NEGATIVE

## 2014-01-30 LAB — CULTURE, BLOOD (ROUTINE X 2)
Culture: NO GROWTH
Culture: NO GROWTH

## 2014-02-11 NOTE — Discharge Summary (Signed)
Death Summary  [name] ZTI:[id number] DOB: Jan 28, 1924 DOA: 29-Jan-2014  PCP: RAMA,CHRISTINA, MD  Admit date: 01/29/2014 Date of Death: 31-Jan-2014  Final Diagnoses:  Sepsis  -Continue vancomycin and cefepime empirically  -spoke with Dr. Jenkins-->recommended PICC which was placed 01/26/14 -Continue phenylephrine  -Present at the time of admission  -Secondary to HCAP  -Start stress steroids--Solu-Cortef 50 mg IV every 8 hours -The patient's blood pressure remained marginal- -phenylephrine IV had to be gradually increased-- - on the evening of 01/26/2014 the patient's blood pressure continued to decline and the patient's phenylephrine was increased up to 110 mcg per kilogram per minute  -Unfortunately, the patient continued to decline clinically and expired on 31-Jan-2014 at 0305  HCAP  -Continue antibiotics as discussed above  Acute on chronic systolic CHF  -Diuresis is limited at this time due to hypotension  -Continue phenylephrine  Atrial fibrillation  -Heart rate 100-110  -Hypertension limits chronotropic agent  Tachybradycardia syndrome  -Status post PPM  Neutropenia/B-cell lymphoma  -Previously receiving chemotherapy with rituximab  -Transfuse for hemoglobin <7  Acute on chronic renal failure (CKD stage III-IV)  -Renal ultrasound--negative for hydronephrosis   -Likely due to ATN and hypovolemia  Goals of care  -Patient is DO NOT RESUSCITATE/DNI  -Family wishes to continue aggressive care for at least the next 24 hours  Family Communication: family at beside updated  Disposition Plan: ICU  Antibiotics:  Vanco 4/14/>>>  Cefepime 4/14>>>   History of present illness:  80 y.o. year-old female with history of atrial fibrillation on chronic anticoagulation, hypertension, B-cell splenic marginal zone lymphoma and pancytopenia who has been receiving rituximab for the last 4 weeks, congestive heart failure with EF of 35-40%, GERD, hypothyroidism, possible lupus, who  presents with shortness of breath, cough, not eating well, and low blood pressure. The patient was last at their baseline health approximately 2 weeks ago. About one week ago, she developed some cough and increased lower extremity edema. She was told that she had heart failure and possibly pneumonia. She was given a prescription for furosemide and levofloxacin. Since starting these medications, she has had a quick decline. She became intermittently confused, had poor appetite. Her cough persisted and she became short of breath. Today, she presented to the clinic for routine labs prior to rituximab infusion and she was noted to be hypotensive. She was rhonchorous and short of breath. She was referred to the emergency department. Her husband states that she has not had any reported fevers.   In the emergency room, temperature 90.3, pulse in the 110s, blood pressure 70s over 50s, breathing in the mid 20 with 99% oxygen saturation on 2 L nasal cannula. Labs were notable for white blood cell count of 1, ANC of 500, hemoglobin 10.8, platelets 48, BUN 98, creatinine 2.83, up from baseline of 1.3. Liver function tests were generally within normal limits except for low albumin. She was given 2 L IV fluids and given vancomycin and cefepime. Her goals of care were clarified, and the family would like antibiotics, IV fluids, and medications to support blood pressure possible, but they do not want any invasive procedures or painful tests. She is DO NOT RESUSCITATE/DO NOT INTUBATE. CXR demonstrates bilateral pleural effusions and right basilar infiltrate. UA negative.       The results of significant diagnostics from this hospitalization (including imaging, microbiology, ancillary and laboratory) are listed below for reference.    Significant Diagnostic Studies: US Renal  01/26/2014   CLINICAL DATA:  ACUTE KIDNEY INJURY  EXAM: RENAL/URINARY TRACT ULTRASOUND COMPLETE  COMPARISON:  None.  FINDINGS: Right Kidney:  Length:  9.5 CM. Thinning of the cortex. Echogenicity within normal limits. No mass or hydronephrosis visualized. Cyst within the inferior pole measures 2 x 2 x 2.1 cm.  Left Kidney:  Length: 9.6 CM. Thinning of the cortex. Echogenicity within normal limits. No mass or hydronephrosis visualized. Cyst within the upper pole measures 2.3 x 2.1 x 2.5 cm. Cyst within the midpole measures 2 x 1.9 x 1.5 cm.  Bladder:  Decompressed around a Foley catheter.  IMPRESSION: 1. Bilateral renal cortical thinning. 2. Renal cysts.   Electronically Signed   By: Kerby Moors M.D.   On: 01/26/2014 13:34   Dg Chest Port 1 View  01/26/2014   CLINICAL DATA:  Line placement  EXAM: PORTABLE CHEST - 1 VIEW  COMPARISON:  Portable exam 1352 hr compared to Feb 01, 2014  FINDINGS: Right arm PICC line tip projecting over mid SVC.  Enlargement of cardiac silhouette with pulmonary vascular congestion.  Bibasilar pleural effusions greater on right.  Persistent pulmonary edema.  Left subclavian transvenous pacemaker leads project over right atrium and right ventricle.  No pneumothorax.  Bones demineralized.  IMPRESSION: Tip of right arm PICC line projects over SVC.  Persistent enlargement of cardiac silhouette with pulmonary vascular congestion, diffuse pulmonary edema and persistent pleural effusions greater on right.   Electronically Signed   By: Lavonia Dana M.D.   On: 01/26/2014 14:10   Dg Chest Port 1 View  2014/02/01   CLINICAL DATA:  Shortness of breath  EXAM: PORTABLE CHEST - 1 VIEW  COMPARISON:  12/02/2013  FINDINGS: Cardiac shadow is enlarged. A pacing device is again identified. Increasing right effusion and right basilar infiltrate is noted. A small left-sided pleural effusion is noted.  IMPRESSION: Increasing right basilar changes.  New left effusion.   Electronically Signed   By: Inez Catalina M.D.   On: February 01, 2014 12:31    Microbiology: Recent Results (from the past 240 hour(s))  CULTURE, BLOOD (ROUTINE X 2)     Status: None    Collection Time    February 01, 2014 11:42 AM      Result Value Ref Range Status   Specimen Description BLOOD LEFT HAND   Final   Special Requests     Final   Value: BOTTLES DRAWN AEROBIC AND ANAEROBIC 5CC  IMMUNE:COMPROMISED   Culture NO GROWTH 2 DAYS   Final   Report Status PENDING   Incomplete  CULTURE, BLOOD (ROUTINE X 2)     Status: None   Collection Time    Feb 01, 2014 11:45 AM      Result Value Ref Range Status   Specimen Description BLOOD LEFT ANTECUBITAL   Final   Special Requests     Final   Value: BOTTLES DRAWN AEROBIC AND ANAEROBIC 6CC IMMUNE:COMPROMISED   Culture NO GROWTH 2 DAYS   Final   Report Status PENDING   Incomplete  MRSA PCR SCREENING     Status: Abnormal   Collection Time    02/01/2014  5:00 PM      Result Value Ref Range Status   MRSA by PCR POSITIVE (*) NEGATIVE Final   Comment:            The GeneXpert MRSA Assay (FDA     approved for NASAL specimens     only), is one component of a     comprehensive MRSA colonization     surveillance program. It is not  intended to diagnose MRSA     infection nor to guide or     monitor treatment for     MRSA infections.     RESULT CALLED TO, READ BACK BY AND VERIFIED WITH:     KOGER L AT 2022 ON [id number] BY FORSYTH K     Labs: Basic Metabolic Panel:  Recent Labs Lab 01/19/2014 0947 01/24/2014 1151 01/26/14 0514  NA 137 138 137  K 4.9 5.0 4.8  CL 97 99 99  CO2 28 28 24   GLUCOSE 101* 92 98  BUN 97* 98* 94*  CREATININE 2.85* 2.83* 2.82*  CALCIUM 8.8 8.6 8.4   Liver Function Tests:  Recent Labs Lab 02/02/2014 0947  AST 14  ALT 10  ALKPHOS 67  BILITOT 0.5  PROT 6.3  ALBUMIN 2.8*   No results found for this basename: LIPASE, AMYLASE,  in the last 168 hours No results found for this basename: AMMONIA,  in the last 168 hours CBC:  Recent Labs Lab 02/04/2014 0947 02/04/2014 1151 01/26/14 0514  WBC 1.1* 1.0* 2.6*  NEUTROABS 0.5* 0.5*  --   HGB 11.0* 10.8* 12.0  HCT 33.9* 33.4* 36.7  MCV 95.5 95.2 93.4  PLT 51*  48* 93*   Cardiac Enzymes:  Recent Labs Lab 02/06/2014 1726 01/16/2014 2332 01/26/14 0514  TROPONINI <0.30 <0.30 <0.30   BNP: No components found with this basename: POCBNP,  CBG:  Recent Labs Lab 01/23/2014 1135  GLUCAP 83    Time: 0305  Signed:  DTat, DO [id number] [facility] Feb 18, 2014, 6:36 PM

## 2014-02-11 DEATH — deceased

## 2014-02-14 ENCOUNTER — Encounter: Payer: Medicare HMO | Admitting: Internal Medicine

## 2014-02-28 ENCOUNTER — Ambulatory Visit: Payer: Medicare HMO | Admitting: Cardiology

## 2014-06-07 ENCOUNTER — Other Ambulatory Visit: Payer: Self-pay | Admitting: *Deleted

## 2015-08-18 IMAGING — CT CT ABD-PELV W/O CM
2 of 4 series · 16 of 46 positions shown, 18 images · non-contrast
Comparison: 01/11/2008

CLINICAL DATA: Left lower quadrant tenderness, anemia, evaluate for
retroperitoneal hemorrhage

EXAM:
CT ABDOMEN AND PELVIS WITHOUT CONTRAST
TECHNIQUE: Multidetector CT imaging of the abdomen and pelvis was performed
following the standard protocol without intravenous contrast.

[Series 3: abd/ pelvis 5.0 i30f 1 · axial · 0.66mm/px · z∈[-1108,-693]mm · 13 of 91 slices shown, 15 images]
[im 4/91  soft-tissue]
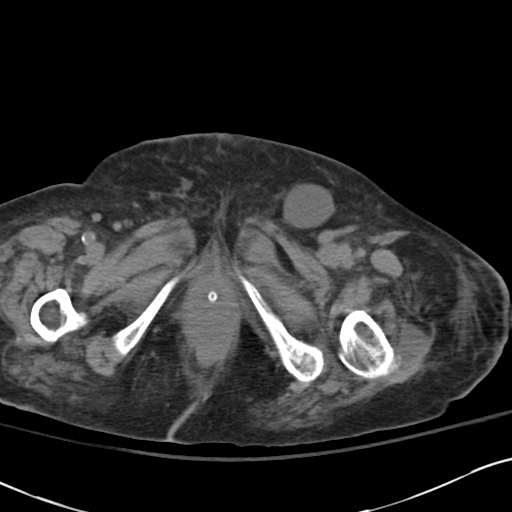
[im 4/91  bone]
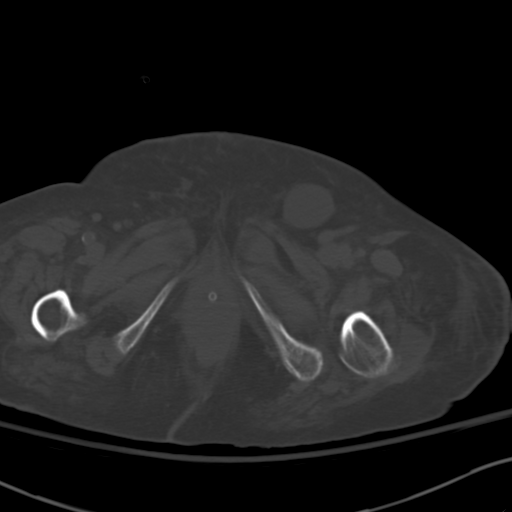
[im 11/91  soft-tissue]
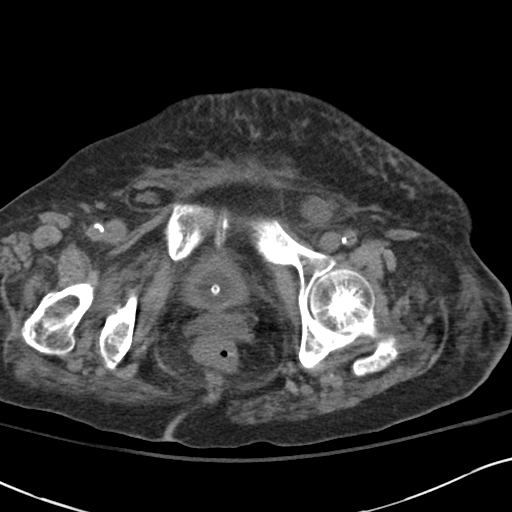
[im 19/91  soft-tissue]
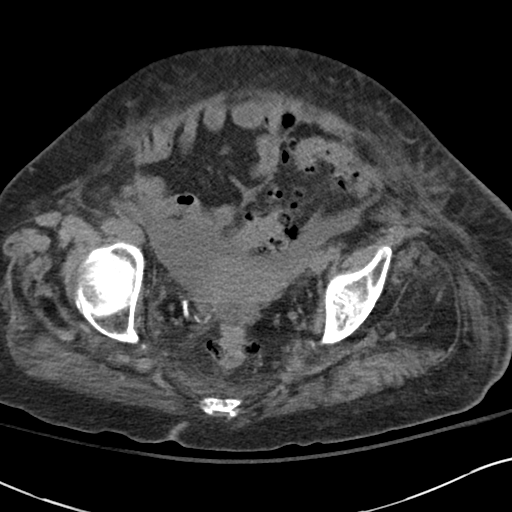
[im 26/91  soft-tissue]
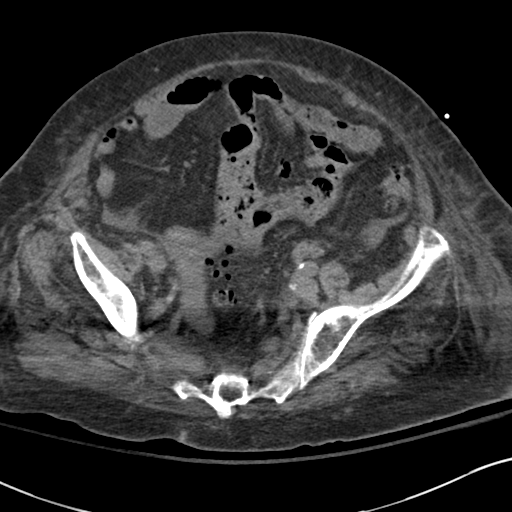
[im 33/91  soft-tissue]
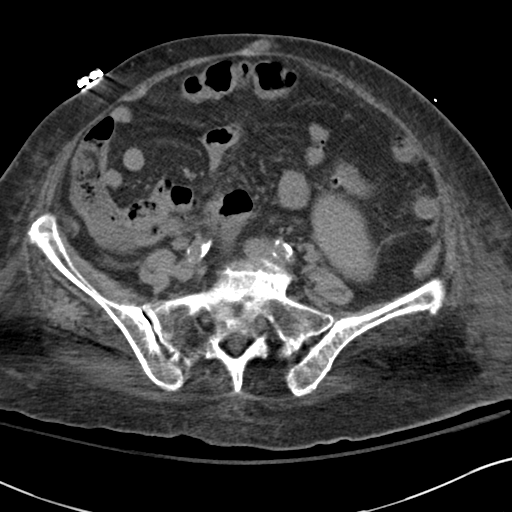
[im 40/91  soft-tissue]
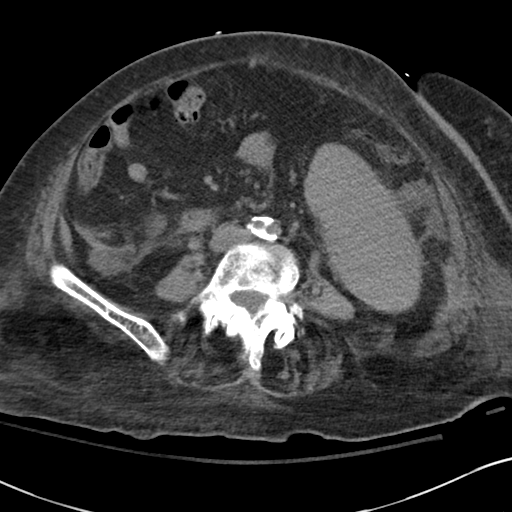
[im 47/91  soft-tissue]
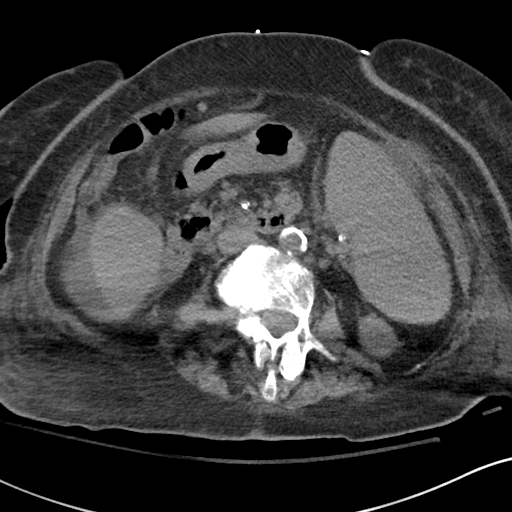
[im 51/91  soft-tissue]
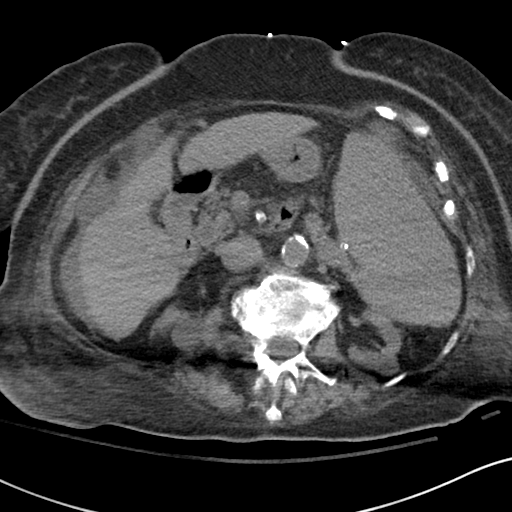
[im 58/91  soft-tissue]
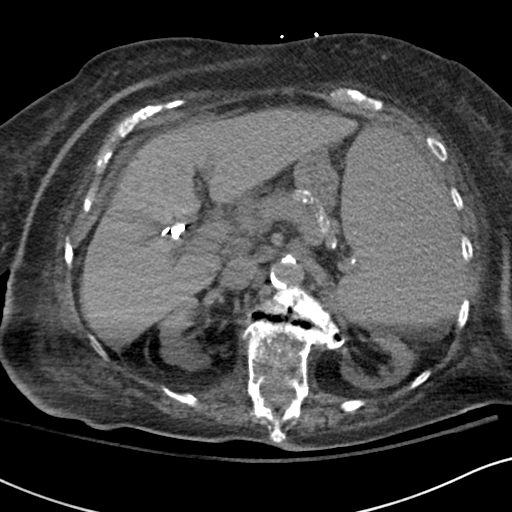
[im 58/91  bone]
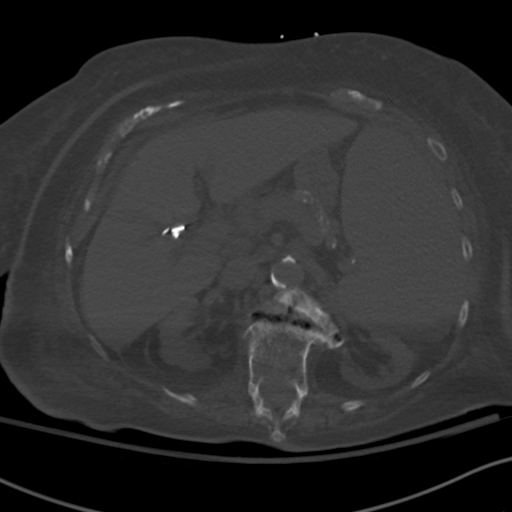
[im 65/91  soft-tissue]
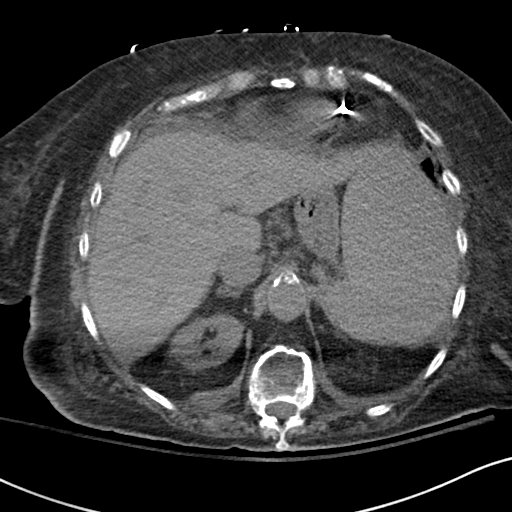
[im 73/91  soft-tissue]
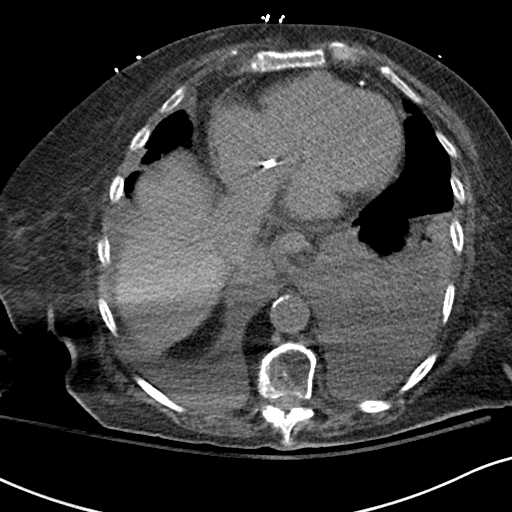
[im 80/91  soft-tissue]
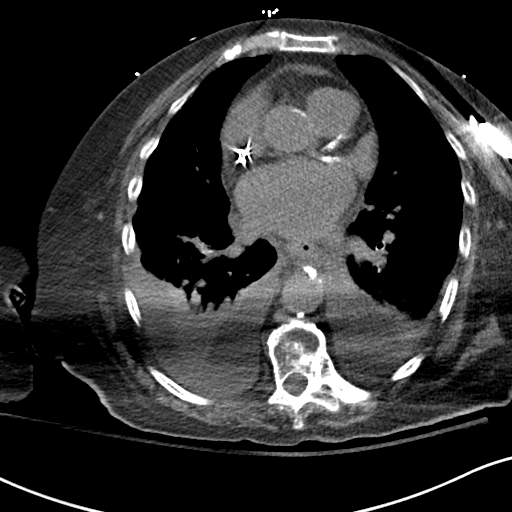
[im 87/91  soft-tissue]
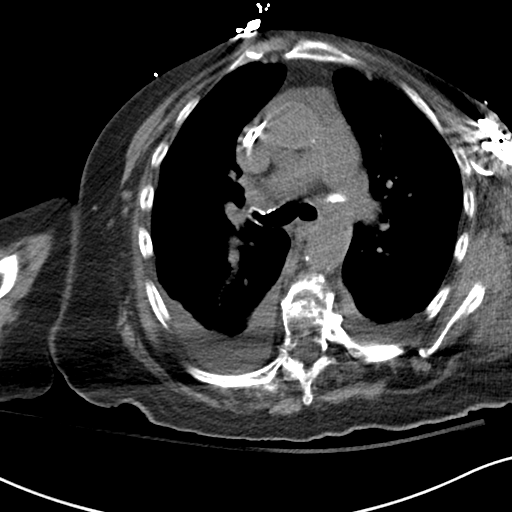

[Series 6: cor st · coronal · 0.68mm/px · 3 of 101 slices shown]
[im 34/101  soft-tissue]
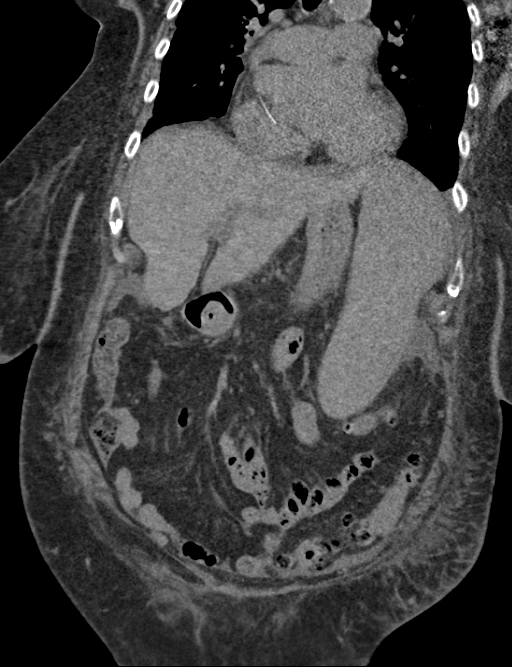
[im 45/101  soft-tissue]
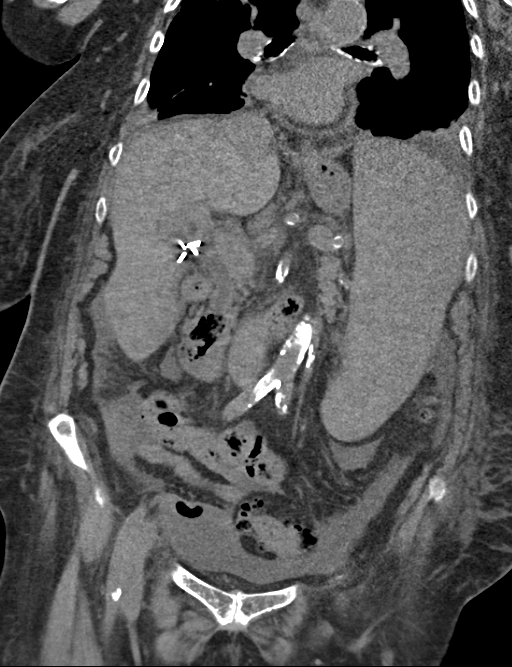
[im 56/101  soft-tissue]
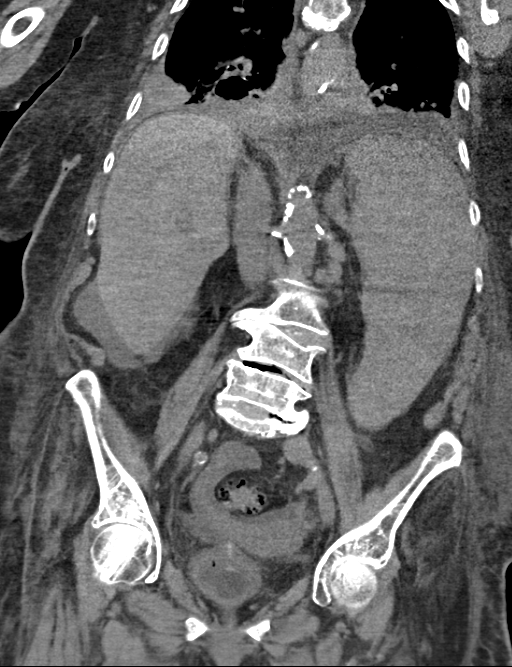

[16 of 46 positions shown; findings below may reference images not displayed]

FINDINGS: Coronary atherosclerosis. Left subclavian pacemaker, incompletely
visualized.

Moderate right and small left pleural effusions. Associated lower
lobe opacities, likely atelectasis. Mild interstitial edema is
suspected (series 4/ image 3).

Unenhanced liver, pancreas, and adrenal glands are within normal
limits.

Splenomegaly, measuring 20.6 cm in craniocaudal dimension.

Status post cholecystectomy. No intrahepatic or extrahepatic ductal
dilatation.

Bilateral renal cortical atrophy with multiple renal cysts,
including a 2.7 cm lateral left upper pole renal cyst (series 3/
image 31) and a 2.3 cm medial right lower pole renal cyst (series
3/image 40). No renal calculi or hydronephrosis.

No evidence of bowel obstruction. Extensive colonic diverticulosis,
without associated inflammatory changes.

Atherosclerotic calcifications of the abdominal aorta and branch
vessels.

Small volume abdominopelvic ascites, likely simple. Perihepatic
ascites measures just above simple fluid density, although this
likely spuriously elevated due to streak artifact from the patient's
arms.

Body wall edema.

No evidence of retroperitoneal hemorrhage.

No suspicious abdominopelvic lymphadenopathy.

Bladder is decompressed by indwelling Foley catheter.

Uterus and bilateral ovaries are poorly evaluated.

Tiny right and small left fat containing inguinal hernias.
Associated 2.8 x 3.2 cm fluid collection within the left hernia
(series 3/image 88).

Degenerative changes of the visualized thoracolumbar spine.
Exaggerated lower thoracic kyphosis.
IMPRESSION: No evidence of retroperitoneal hemorrhage.

Splenomegaly, measuring 20.6 cm in craniocaudal dimension.

Moderate right and small left pleural effusions with possible mild
interstitial edema.

Small volume abdominopelvic ascites, simple.  Body wall edema.
